# Patient Record
Sex: Female | Born: 1985 | Race: Black or African American | Hispanic: No | Marital: Single | Smoking: Former smoker
Health system: Southern US, Community
[De-identification: ages and names within clinical notes are randomized; demographics above are authoritative.]

## PROBLEM LIST (undated history)

## (undated) ENCOUNTER — Inpatient Hospital Stay (HOSPITAL_COMMUNITY): Payer: Self-pay

## (undated) DIAGNOSIS — F329 Major depressive disorder, single episode, unspecified: Secondary | ICD-10-CM

## (undated) DIAGNOSIS — D259 Leiomyoma of uterus, unspecified: Secondary | ICD-10-CM

## (undated) DIAGNOSIS — I10 Essential (primary) hypertension: Secondary | ICD-10-CM

## (undated) DIAGNOSIS — F4310 Post-traumatic stress disorder, unspecified: Secondary | ICD-10-CM

## (undated) DIAGNOSIS — G43909 Migraine, unspecified, not intractable, without status migrainosus: Secondary | ICD-10-CM

## (undated) DIAGNOSIS — O139 Gestational [pregnancy-induced] hypertension without significant proteinuria, unspecified trimester: Secondary | ICD-10-CM

## (undated) DIAGNOSIS — D219 Benign neoplasm of connective and other soft tissue, unspecified: Secondary | ICD-10-CM

## (undated) DIAGNOSIS — R87619 Unspecified abnormal cytological findings in specimens from cervix uteri: Secondary | ICD-10-CM

## (undated) DIAGNOSIS — B977 Papillomavirus as the cause of diseases classified elsewhere: Secondary | ICD-10-CM

## (undated) HISTORY — DX: Unspecified abnormal cytological findings in specimens from cervix uteri: R87.619

## (undated) HISTORY — PX: WISDOM TOOTH EXTRACTION: SHX21

## (undated) HISTORY — DX: Migraine, unspecified, not intractable, without status migrainosus: G43.909

## (undated) HISTORY — DX: Leiomyoma of uterus, unspecified: D25.9

---
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
## 2007-08-25 ENCOUNTER — Other Ambulatory Visit: Admission: RE | Admit: 2007-08-25 | Discharge: 2007-08-25 | Payer: Self-pay | Admitting: Internal Medicine

## 2010-06-18 ENCOUNTER — Emergency Department (HOSPITAL_COMMUNITY): Payer: Self-pay

## 2010-06-18 ENCOUNTER — Inpatient Hospital Stay (INDEPENDENT_AMBULATORY_CARE_PROVIDER_SITE_OTHER)
Admission: RE | Admit: 2010-06-18 | Discharge: 2010-06-18 | Disposition: A | Discharge status: Home / Self Care | Payer: Self-pay | Source: Ambulatory Visit | Attending: Family Medicine | Admitting: Family Medicine

## 2010-06-18 ENCOUNTER — Emergency Department (HOSPITAL_COMMUNITY)
Admission: EM | Admit: 2010-06-18 | Discharge: 2010-06-18 | Disposition: A | Discharge status: Home / Self Care | Payer: Self-pay | Attending: Emergency Medicine | Admitting: Emergency Medicine

## 2010-06-18 DIAGNOSIS — R319 Hematuria, unspecified: Secondary | ICD-10-CM

## 2010-06-18 DIAGNOSIS — R1031 Right lower quadrant pain: Secondary | ICD-10-CM | POA: Insufficient documentation

## 2010-06-18 DIAGNOSIS — R109 Unspecified abdominal pain: Secondary | ICD-10-CM

## 2010-06-18 DIAGNOSIS — R110 Nausea: Secondary | ICD-10-CM | POA: Insufficient documentation

## 2010-06-18 DIAGNOSIS — R630 Anorexia: Secondary | ICD-10-CM | POA: Insufficient documentation

## 2010-06-18 DIAGNOSIS — N3090 Cystitis, unspecified without hematuria: Secondary | ICD-10-CM | POA: Insufficient documentation

## 2010-06-18 DIAGNOSIS — N898 Other specified noninflammatory disorders of vagina: Secondary | ICD-10-CM | POA: Insufficient documentation

## 2010-06-18 LAB — DIFFERENTIAL
Eosinophils Absolute: 0.1 10*3/uL (ref 0.0–0.7)
Lymphocytes Relative: 9 % — ABNORMAL LOW (ref 12–46)
Lymphs Abs: 1.1 10*3/uL (ref 0.7–4.0)
Monocytes Absolute: 0.5 10*3/uL (ref 0.1–1.0)

## 2010-06-18 LAB — COMPREHENSIVE METABOLIC PANEL
ALT: 25 U/L (ref 0–35)
AST: 32 U/L (ref 0–37)
Alkaline Phosphatase: 74 U/L (ref 39–117)
BUN: 8 mg/dL (ref 6–23)
CO2: 25 mEq/L (ref 19–32)
GFR calc Af Amer: >60 mL/min (ref >60)
GFR calc non Af Amer: >60 mL/min (ref >60)
Glucose, Bld: 113 mg/dL — ABNORMAL HIGH (ref 70–99)
Sodium: 140 mEq/L (ref 135–145)
Total Bilirubin: 0.4 mg/dL (ref 0.3–1.2)
Total Protein: 7.2 g/dL (ref 6.0–8.3)

## 2010-06-18 LAB — URINALYSIS, ROUTINE W REFLEX MICROSCOPIC
Glucose, UA: NEGATIVE mg/dL
Nitrite: POSITIVE — AB
Urobilinogen, UA: 0.2 mg/dL (ref 0.0–1.0)
pH: 6.5 (ref 5.0–8.0)

## 2010-06-18 LAB — CBC
HCT: 32.3 % — ABNORMAL LOW (ref 36.0–46.0)
Hemoglobin: 11.2 g/dL — ABNORMAL LOW (ref 12.0–15.0)
MCH: 27.1 pg (ref 26.0–34.0)
MCV: 78.2 fL (ref 78.0–100.0)
RDW: 12.4 % (ref 11.5–15.5)
WBC: 11.4 10*3/uL — ABNORMAL HIGH (ref 4.0–10.5)

## 2010-06-18 LAB — WET PREP, GENITAL
Trich, Wet Prep: NONE SEEN
Yeast Wet Prep HPF POC: NONE SEEN

## 2010-06-18 LAB — POCT PREGNANCY, URINE: Preg Test, Ur: NEGATIVE

## 2010-06-18 LAB — URINE MICROSCOPIC-ADD ON

## 2010-06-18 MED ORDER — IOHEXOL 300 MG/ML  SOLN
100.0000 mL | Freq: Once | INTRAMUSCULAR | Status: AC | PRN
  Administered 2010-06-18: 100 mL via INTRAVENOUS

## 2010-06-19 LAB — POCT URINALYSIS DIP (DEVICE)
Nitrite: POSITIVE — AB
Protein, ur: NEGATIVE mg/dL
Specific Gravity, Urine: 1.025 (ref 1.005–1.030)
Urobilinogen, UA: 0.2 mg/dL (ref 0.0–1.0)

## 2010-06-19 LAB — GC/CHLAMYDIA PROBE AMP, GENITAL: Chlamydia, DNA Probe: NEGATIVE

## 2010-06-21 LAB — URINE CULTURE
Colony Count: >=100000
Culture  Setup Time: 201206132006

## 2011-07-07 ENCOUNTER — Other Ambulatory Visit: Payer: Self-pay | Admitting: Obstetrics & Gynecology

## 2011-07-07 ENCOUNTER — Other Ambulatory Visit (HOSPITAL_COMMUNITY)
Admission: RE | Admit: 2011-07-07 | Discharge: 2011-07-07 | Disposition: A | Discharge status: Home / Self Care | Payer: BC Managed Care – PPO | Source: Ambulatory Visit | Attending: Obstetrics & Gynecology | Admitting: Obstetrics & Gynecology

## 2011-07-07 DIAGNOSIS — Z124 Encounter for screening for malignant neoplasm of cervix: Secondary | ICD-10-CM | POA: Insufficient documentation

## 2011-07-07 DIAGNOSIS — Z113 Encounter for screening for infections with a predominantly sexual mode of transmission: Secondary | ICD-10-CM | POA: Insufficient documentation

## 2011-09-15 ENCOUNTER — Inpatient Hospital Stay (HOSPITAL_COMMUNITY)
Admission: AD | Admit: 2011-09-15 | Discharge: 2011-09-16 | Disposition: A | Discharge status: Home / Self Care | Payer: BC Managed Care – PPO | Source: Ambulatory Visit | Attending: Obstetrics & Gynecology | Admitting: Obstetrics & Gynecology

## 2011-09-15 ENCOUNTER — Encounter (HOSPITAL_COMMUNITY): Payer: Self-pay | Admitting: *Deleted

## 2011-09-15 DIAGNOSIS — N390 Urinary tract infection, site not specified: Secondary | ICD-10-CM

## 2011-09-15 DIAGNOSIS — N938 Other specified abnormal uterine and vaginal bleeding: Secondary | ICD-10-CM | POA: Insufficient documentation

## 2011-09-15 DIAGNOSIS — N949 Unspecified condition associated with female genital organs and menstrual cycle: Secondary | ICD-10-CM | POA: Insufficient documentation

## 2011-09-15 DIAGNOSIS — M545 Low back pain, unspecified: Secondary | ICD-10-CM | POA: Insufficient documentation

## 2011-09-15 DIAGNOSIS — R109 Unspecified abdominal pain: Secondary | ICD-10-CM | POA: Insufficient documentation

## 2011-09-15 DIAGNOSIS — D259 Leiomyoma of uterus, unspecified: Secondary | ICD-10-CM

## 2011-09-15 DIAGNOSIS — N840 Polyp of corpus uteri: Secondary | ICD-10-CM

## 2011-09-15 DIAGNOSIS — N939 Abnormal uterine and vaginal bleeding, unspecified: Secondary | ICD-10-CM

## 2011-09-15 HISTORY — DX: Benign neoplasm of connective and other soft tissue, unspecified: D21.9

## 2011-09-15 HISTORY — DX: Papillomavirus as the cause of diseases classified elsewhere: B97.7

## 2011-09-15 LAB — URINALYSIS, ROUTINE W REFLEX MICROSCOPIC
Glucose, UA: NEGATIVE mg/dL
Ketones, ur: NEGATIVE mg/dL
Nitrite: POSITIVE — AB
Urobilinogen, UA: 0.2 mg/dL (ref 0.0–1.0)
pH: 6 (ref 5.0–8.0)

## 2011-09-15 LAB — URINE MICROSCOPIC-ADD ON

## 2011-09-15 LAB — CBC
Hemoglobin: 11.8 g/dL — ABNORMAL LOW (ref 12.0–15.0)
MCH: 26.1 pg (ref 26.0–34.0)
WBC: 12.4 10*3/uL — ABNORMAL HIGH (ref 4.0–10.5)

## 2011-09-15 NOTE — MAU Provider Note (Signed)
History     CSN: [id number]  Arrival date and time: 09/15/11 2003   First Provider Initiated Contact with Patient 09/15/11 2245      Chief Complaint  Patient presents with  . Abdominal Cramping   HPI Katrina Garcia is a 26 y.o. female who presents to MAU with abdominal pain. The pain stared 3 days ago but much worse today. Associated symptoms include vaginal bleeding that started over a week ago and low back pain. She describes the pain as sharp and rates it 10/10. She describes the bleeding as less than a period.   OB History    Grav Para Term Preterm Abortions TAB SAB Ect Mult Living   0               Past Medical History  Diagnosis Date  . Fibroid   . HPV (human papilloma virus) infection     Past Surgical History  Procedure Date  . Wisdom tooth extraction     No family history on file.  History  Substance Use Topics  . Smoking status: Former Smoker    Types: Cigarettes    Quit date: 09/06/2011  . Smokeless tobacco: Not on file  . Alcohol Use: No    Allergies: No Known Allergies  Prescriptions prior to admission  Medication Sig Dispense Refill  . acetaminophen (TYLENOL) 500 MG tablet Take 1,000 mg by mouth every 6 (six) hours as needed. For pain or headache      . Levonorgestrel-Ethinyl Estradiol (SEASONIQUE) 0.15-0.03 &0.01 MG tablet Take 1 tablet by mouth daily.        Review of Systems  Constitutional: Negative for fever, chills and weight loss.  HENT: Negative for ear pain, nosebleeds, congestion, sore throat and neck pain.   Eyes: Negative for blurred vision, double vision, photophobia and pain.  Respiratory: Negative for cough, shortness of breath and wheezing.   Cardiovascular: Negative for chest pain, palpitations and leg swelling.  Gastrointestinal: Positive for heartburn, nausea and abdominal pain. Negative for vomiting, diarrhea and constipation.  Genitourinary: Positive for urgency and frequency. Negative for dysuria.  Musculoskeletal: Positive  for back pain. Negative for myalgias.  Skin: Negative for itching and rash.  Neurological: Negative for dizziness, sensory change, speech change, seizures, weakness and headaches.  Endo/Heme/Allergies: Does not bruise/bleed easily.  Psychiatric/Behavioral: Negative for depression. The patient is not nervous/anxious and does not have insomnia.    Physical Exam   Blood pressure 146/84, pulse 76, temperature 97.9 F (36.6 C), temperature source Oral, resp. rate 20, height 5\' 1"  (1.549 m), weight 180 lb (81.647 kg), last menstrual period 09/04/2011, SpO2 100.00%.  Physical Exam  Nursing note and vitals reviewed. Constitutional: She is oriented to person, place, and time. She appears well-developed and well-nourished. No distress.  HENT:  Head: Normocephalic and atraumatic.  Eyes: EOM are normal.  Neck: Neck supple.  Cardiovascular: Normal rate.   Respiratory: Effort normal.  GI: Soft. There is no tenderness.  Musculoskeletal: Normal range of motion.  Neurological: She is alert and oriented to person, place, and time.  Skin: Skin is warm and dry.  Psychiatric: She has a normal mood and affect. Her behavior is normal. Judgment and thought content normal.    MAU Course  Procedures Results for orders placed during the hospital encounter of 09/15/11 (from the past 24 hour(s))  URINALYSIS, ROUTINE W REFLEX MICROSCOPIC     Status: Abnormal   Collection Time   09/15/11  8:40 PM      Component Value Range  Color, Urine YELLOW  YELLOW   APPearance CLEAR  CLEAR   Specific Gravity, Urine >1.030 (*) 1.005 - 1.030   pH 6.0  5.0 - 8.0   Glucose, UA NEGATIVE  NEGATIVE mg/dL   Hgb urine dipstick TRACE (*) NEGATIVE   Bilirubin Urine NEGATIVE  NEGATIVE   Ketones, ur NEGATIVE  NEGATIVE mg/dL   Protein, ur NEGATIVE  NEGATIVE mg/dL   Urobilinogen, UA 0.2  0.0 - 1.0 mg/dL   Nitrite POSITIVE (*) NEGATIVE   Leukocytes, UA TRACE (*) NEGATIVE  URINE MICROSCOPIC-ADD ON     Status: Abnormal    Collection Time   09/15/11  8:40 PM      Component Value Range   Squamous Epithelial / LPF FEW (*) RARE   WBC, UA 3-6  <3 WBC/hpf   RBC / HPF 3-6  <3 RBC/hpf   Bacteria, UA MANY (*) RARE  POCT PREGNANCY, URINE     Status: Normal   Collection Time   09/15/11  8:55 PM      Component Value Range   Preg Test, Ur NEGATIVE  NEGATIVE   US Transvaginal Non-ob  09/16/2011  *RADIOLOGY REPORT*  Clinical Data: AUB x2 weeks.  Pain for 5 days.  TRANSABDOMINAL AND TRANSVAGINAL ULTRASOUND OF PELVIS Technique:  Both transabdominal and transvaginal ultrasound examinations of the pelvis were performed. Transabdominal technique was performed for global imaging of the pelvis including uterus, ovaries, adnexal regions, and pelvic cul-de-sac.  It was necessary to proceed with endovaginal exam following the transabdominal exam to visualize the ovaries and endometrium.  Comparison:  None  Findings:  Uterus: The uterus is anteverted and measures 7.6 x 4.3 x 5.5 cm. There is a myometrial subserosal fibroid in the posterior left lateral fundal region measuring 1.9 cm diameter.  There is a relatively hyperechoic focus in the anterior fundal myometrium which appears to displace the endometrial canal.  This could represent an atypical fibroid.  This area is ovoid in appearance, measuring 1.3 x 3 cm.  Endometrium: Endometrial stripe thickness is normal, measuring 4 mm, however, there is fluid demonstrated in the endometrium and in the lower uterine segment.  Endometrial polyp or physiologic fluid should be considered.  Right ovary:  The right ovary measures 2.9 x 1.3 x 2.2 cm.  Normal follicular changes.  No abnormal adnexal masses.  Left ovary: Left ovary measures 2.6 x 1.1 x 1.9 cm.  Normal follicular changes.  No abnormal adnexal masses.  Other findings: Small amount of free fluid around the right ovary. Color flow Doppler demonstrates flow in both ovaries.  IMPRESSION: Uterine fibroids with heterogeneous nonspecific echogenic focus  in the anterior myometrium.  Fluid in the endometrium.  Consider endometrial polyp.  Ovaries are unremarkable.   Original Report Authenticated By: Marlon Pel, M.D.    US Pelvis Complete  09/16/2011  *RADIOLOGY REPORT*  Clinical Data: AUB x2 weeks.  Pain for 5 days.  TRANSABDOMINAL AND TRANSVAGINAL ULTRASOUND OF PELVIS Technique:  Both transabdominal and transvaginal ultrasound examinations of the pelvis were performed. Transabdominal technique was performed for global imaging of the pelvis including uterus, ovaries, adnexal regions, and pelvic cul-de-sac.  It was necessary to proceed with endovaginal exam following the transabdominal exam to visualize the ovaries and endometrium.  Comparison:  None  Findings:  Uterus: The uterus is anteverted and measures 7.6 x 4.3 x 5.5 cm. There is a myometrial subserosal fibroid in the posterior left lateral fundal region measuring 1.9 cm diameter.  There is a relatively hyperechoic focus in  the anterior fundal myometrium which appears to displace the endometrial canal.  This could represent an atypical fibroid.  This area is ovoid in appearance, measuring 1.3 x 3 cm.  Endometrium: Endometrial stripe thickness is normal, measuring 4 mm, however, there is fluid demonstrated in the endometrium and in the lower uterine segment.  Endometrial polyp or physiologic fluid should be considered.  Right ovary:  The right ovary measures 2.9 x 1.3 x 2.2 cm.  Normal follicular changes.  No abnormal adnexal masses.  Left ovary: Left ovary measures 2.6 x 1.1 x 1.9 cm.  Normal follicular changes.  No abnormal adnexal masses.  Other findings: Small amount of free fluid around the right ovary. Color flow Doppler demonstrates flow in both ovaries.  IMPRESSION: Uterine fibroids with heterogeneous nonspecific echogenic focus in the anterior myometrium.  Fluid in the endometrium.  Consider endometrial polyp.  Ovaries are unremarkable.   Original Report Authenticated By: Marlon Pel,  M.D.    Assessment: 26 y.o. female with abnormal vaginal bleeding   Pelvic pain   Uterine fibroid   Endometrial polyp   UTI  Plan:  Pain management   Tx UTI   Follow up in the office, return here as needed.  Discussed with Dr. Christell Constant and he agrees with plan of care.   Medication List     As of 09/16/2011  2:58 AM    START taking these medications         oxyCODONE-acetaminophen 5-325 MG per tablet   Commonly known as: PERCOCET/ROXICET   Take 1 tablet by mouth every 4 (four) hours as needed for pain.      sulfamethoxazole-trimethoprim 800-160 MG per tablet   Commonly known as: BACTRIM DS,SEPTRA DS   Take 1 tablet by mouth 2 (two) times daily.      CONTINUE taking these medications         acetaminophen 500 MG tablet   Commonly known as: TYLENOL      SEASONIQUE 0.15-0.03 &0.01 MG tablet   Generic drug: Levonorgestrel-Ethinyl Estradiol          Where to get your medications    These are the prescriptions that you need to pick up.   You may get these medications from any pharmacy.         oxyCODONE-acetaminophen 5-325 MG per tablet   sulfamethoxazole-trimethoprim 800-160 MG per tablet          I have reviewed the nurses noted, vital signs, ultrasound and discussed the results in detail with the patient. Patient voices understanding.   Walker Sitar, RN, FNP, [facility] 09/15/2011, 10:45 PM

## 2011-09-15 NOTE — MAU Note (Signed)
Pt reports brownish discharge started 1.5 weeks ago, then over last few days bright red bleeding "not a lot", and abd cramping. Started BCP's in July. LMP in july

## 2011-09-16 ENCOUNTER — Inpatient Hospital Stay (HOSPITAL_COMMUNITY): Payer: BC Managed Care – PPO

## 2011-09-16 LAB — WET PREP, GENITAL: Trich, Wet Prep: NONE SEEN

## 2011-09-16 LAB — GC/CHLAMYDIA PROBE AMP, GENITAL
Chlamydia, DNA Probe: NEGATIVE
GC Probe Amp, Genital: NEGATIVE

## 2011-09-16 LAB — HCG, QUANTITATIVE, PREGNANCY: hCG, Beta Chain, Quant, S: <1 m[IU]/mL (ref <5)

## 2011-09-16 MED ORDER — OXYCODONE-ACETAMINOPHEN 5-325 MG PO TABS
1.0000 | ORAL_TABLET | Freq: Once | ORAL | Status: AC
  Administered 2011-09-16: 1 via ORAL
  Filled 2011-09-16: qty 1

## 2011-09-16 MED ORDER — OXYCODONE-ACETAMINOPHEN 5-325 MG PO TABS: 1.0000 | ORAL_TABLET | ORAL | Status: AC | PRN

## 2011-09-16 MED ORDER — SULFAMETHOXAZOLE-TRIMETHOPRIM 800-160 MG PO TABS: 1.0000 | ORAL_TABLET | Freq: Two times a day (BID) | ORAL | Status: AC

## 2011-09-16 NOTE — Progress Notes (Signed)
Small amount of uterine tissue noted in cervical os. Removed easily by H.NeeseNP. Will send to pathology for eval.

## 2011-10-09 NOTE — MAU Provider Note (Signed)
As I was informed and agreed with the plan of care

## 2012-07-15 ENCOUNTER — Other Ambulatory Visit: Payer: Self-pay | Admitting: Obstetrics and Gynecology

## 2012-07-15 DIAGNOSIS — N644 Mastodynia: Secondary | ICD-10-CM

## 2012-07-26 ENCOUNTER — Other Ambulatory Visit: Payer: BC Managed Care – PPO

## 2012-08-08 ENCOUNTER — Ambulatory Visit
Admission: RE | Admit: 2012-08-08 | Discharge: 2012-08-08 | Disposition: A | Discharge status: Home / Self Care | Payer: BC Managed Care – PPO | Source: Ambulatory Visit | Attending: Obstetrics and Gynecology | Admitting: Obstetrics and Gynecology

## 2012-08-08 ENCOUNTER — Other Ambulatory Visit: Payer: Self-pay | Admitting: Obstetrics and Gynecology

## 2012-08-08 DIAGNOSIS — N644 Mastodynia: Secondary | ICD-10-CM

## 2012-08-09 ENCOUNTER — Emergency Department (HOSPITAL_COMMUNITY)
Admission: EM | Admit: 2012-08-09 | Discharge: 2012-08-10 | Disposition: A | Discharge status: Home / Self Care | Payer: BC Managed Care – PPO | Attending: Emergency Medicine | Admitting: Emergency Medicine

## 2012-08-09 ENCOUNTER — Encounter (HOSPITAL_COMMUNITY): Payer: Self-pay | Admitting: *Deleted

## 2012-08-09 DIAGNOSIS — Z3202 Encounter for pregnancy test, result negative: Secondary | ICD-10-CM | POA: Insufficient documentation

## 2012-08-09 DIAGNOSIS — F329 Major depressive disorder, single episode, unspecified: Secondary | ICD-10-CM | POA: Insufficient documentation

## 2012-08-09 DIAGNOSIS — R45851 Suicidal ideations: Secondary | ICD-10-CM | POA: Insufficient documentation

## 2012-08-09 DIAGNOSIS — T394X2A Poisoning by antirheumatics, not elsewhere classified, intentional self-harm, initial encounter: Secondary | ICD-10-CM | POA: Insufficient documentation

## 2012-08-09 DIAGNOSIS — Z008 Encounter for other general examination: Secondary | ICD-10-CM | POA: Insufficient documentation

## 2012-08-09 DIAGNOSIS — F4310 Post-traumatic stress disorder, unspecified: Secondary | ICD-10-CM | POA: Insufficient documentation

## 2012-08-09 DIAGNOSIS — T1491XA Suicide attempt, initial encounter: Secondary | ICD-10-CM

## 2012-08-09 DIAGNOSIS — Z8742 Personal history of other diseases of the female genital tract: Secondary | ICD-10-CM | POA: Insufficient documentation

## 2012-08-09 DIAGNOSIS — Z87891 Personal history of nicotine dependence: Secondary | ICD-10-CM | POA: Insufficient documentation

## 2012-08-09 DIAGNOSIS — Z8619 Personal history of other infectious and parasitic diseases: Secondary | ICD-10-CM | POA: Insufficient documentation

## 2012-08-09 DIAGNOSIS — T391X1A Poisoning by 4-Aminophenol derivatives, accidental (unintentional), initial encounter: Secondary | ICD-10-CM | POA: Insufficient documentation

## 2012-08-09 LAB — POCT PREGNANCY, URINE: Preg Test, Ur: NEGATIVE

## 2012-08-09 LAB — CBC
MCH: 26.5 pg (ref 26.0–34.0)
MCHC: 34.1 g/dL (ref 30.0–36.0)
MCV: 77.7 fL — ABNORMAL LOW (ref 78.0–100.0)
Platelets: 328 10*3/uL (ref 150–400)

## 2012-08-09 LAB — COMPREHENSIVE METABOLIC PANEL
ALT: 10 U/L (ref 0–35)
AST: 18 U/L (ref 0–37)
CO2: 23 mEq/L (ref 19–32)
Calcium: 8.9 mg/dL (ref 8.4–10.5)
Creatinine, Ser: 0.7 mg/dL (ref 0.50–1.10)
GFR calc Af Amer: >90 mL/min (ref >90)
GFR calc non Af Amer: >90 mL/min (ref >90)
Glucose, Bld: 97 mg/dL (ref 70–99)
Sodium: 136 mEq/L (ref 135–145)
Total Protein: 7.5 g/dL (ref 6.0–8.3)

## 2012-08-09 LAB — GLUCOSE, CAPILLARY: Glucose-Capillary: 84 mg/dL (ref 70–99)

## 2012-08-09 LAB — RAPID URINE DRUG SCREEN, HOSP PERFORMED
Amphetamines: NOT DETECTED
Opiates: NOT DETECTED

## 2012-08-09 LAB — SALICYLATE LEVEL: Salicylate Lvl: <2 mg/dL — ABNORMAL LOW (ref 2.8–20.0)

## 2012-08-09 LAB — ACETAMINOPHEN LEVEL: Acetaminophen (Tylenol), Serum: 39.7 ug/mL — ABNORMAL HIGH (ref 10–30)

## 2012-08-09 MED ORDER — POTASSIUM CHLORIDE CRYS ER 20 MEQ PO TBCR
40.0000 meq | EXTENDED_RELEASE_TABLET | Freq: Once | ORAL | Status: AC
  Administered 2012-08-09: 40 meq via ORAL
  Filled 2012-08-09: qty 2

## 2012-08-09 NOTE — ED Notes (Signed)
Pt in stating she took approx 7, 500 mg tylenol tablets around 1730-1800 today, pt states she was not trying to hurt herself and she isn't sure why she took so many, pt with flat affect noted, pt admits to some SI today but denies at this time, denies HI, denies ETOH or drug use.

## 2012-08-10 ENCOUNTER — Encounter (HOSPITAL_COMMUNITY): Payer: Self-pay | Admitting: *Deleted

## 2012-08-10 ENCOUNTER — Inpatient Hospital Stay (HOSPITAL_COMMUNITY)
Admission: EM | Admit: 2012-08-10 | Discharge: 2012-08-13 | DRG: 430 | Disposition: A | Discharge status: Home / Self Care | Payer: BC Managed Care – PPO | Source: Intra-hospital | Attending: Psychiatry | Admitting: Psychiatry

## 2012-08-10 DIAGNOSIS — F329 Major depressive disorder, single episode, unspecified: Secondary | ICD-10-CM | POA: Diagnosis present

## 2012-08-10 DIAGNOSIS — Z79899 Other long term (current) drug therapy: Secondary | ICD-10-CM

## 2012-08-10 DIAGNOSIS — F4310 Post-traumatic stress disorder, unspecified: Secondary | ICD-10-CM | POA: Diagnosis present

## 2012-08-10 HISTORY — DX: Post-traumatic stress disorder, unspecified: F43.10

## 2012-08-10 HISTORY — DX: Major depressive disorder, single episode, unspecified: F32.9

## 2012-08-10 MED ORDER — LEVONORGEST-ETH ESTRAD 91-DAY 0.15-0.03 &0.01 MG PO TABS
1.0000 | ORAL_TABLET | Freq: Every day | ORAL | Status: DC
  Administered 2012-08-10 – 2012-08-12 (×3): 1 via ORAL

## 2012-08-10 MED ORDER — TRAZODONE HCL 50 MG PO TABS
50.0000 mg | ORAL_TABLET | Freq: Every evening | ORAL | Status: DC | PRN
  Administered 2012-08-10: 50 mg via ORAL
  Filled 2012-08-10: qty 1

## 2012-08-10 MED ORDER — MAGNESIUM HYDROXIDE 400 MG/5ML PO SUSP: 30.0000 mL | Freq: Every day | ORAL | Status: DC | PRN

## 2012-08-10 MED ORDER — ALUM & MAG HYDROXIDE-SIMETH 200-200-20 MG/5ML PO SUSP: 30.0000 mL | ORAL | Status: DC | PRN

## 2012-08-10 MED ORDER — ACETAMINOPHEN 325 MG PO TABS: 650.0000 mg | ORAL_TABLET | Freq: Four times a day (QID) | ORAL | Status: DC | PRN

## 2012-08-10 NOTE — ED Notes (Signed)
Telepsych being completed at this time. 

## 2012-08-10 NOTE — ED Notes (Signed)
Pt upset about becoming IVC.  Pt reports "I am not going there. Fuck that."  Security noted of situation and informed to be on standby.

## 2012-08-10 NOTE — Progress Notes (Signed)
Adult Psychoeducational Group Note  Date:  08/10/2012 Time:  9:38 PM  Group Topic/Focus:  Wrap-Up Group:   The focus of this group is to help patients review their daily goal of treatment and discuss progress on daily workbooks.  Participation Level:  Active  Participation Quality:  Appropriate  Affect:  Appropriate  Cognitive:  Alert  Insight: Appropriate  Engagement in Group:  Engaged  Modes of Intervention:  Discussion  Additional Comments:  Pt stated that this is her first full day on the unit and it has been very stressful. Pt also stated that she is trying to be positive and look on the brighter side of things. Pt wants to learn how to cope with her stress and insecurities.  Flonnie Hailstone 08/10/2012, 9:38 PM

## 2012-08-10 NOTE — Tx Team (Signed)
Initial Interdisciplinary Treatment Plan  PATIENT STRENGTHS: (choose at least two) Average or above average intelligence Capable of independent living General fund of knowledge Supportive family/friends  PATIENT STRESSORS: Unknown   PROBLEM LIST: Problem List/Patient Goals Date to be addressed Date deferred Reason deferred Estimated date of resolution                                                         DISCHARGE CRITERIA:  Improved stabilization in mood, thinking, and/or behavior Need for constant or close observation no longer present Reduction of life-threatening or endangering symptoms to within safe limits  PRELIMINARY DISCHARGE PLAN: Outpatient therapy Return to previous living arrangement  PATIENT/FAMIILY INVOLVEMENT: This treatment plan has been presented to and reviewed with the patient, Katrina Garcia.  The patient and family have been given the opportunity to ask questions and make suggestions.  [name] M 08/10/2012, 6:10 PM

## 2012-08-10 NOTE — Progress Notes (Signed)
Patient ID: Katrina Garcia, female   DOB: Nov 10, 1985, 27 y.o.   MRN: [id number] Patient overdosed on 7500 mg of Tylenol and was sending out texts stating " I dont want to be here anymore; patient reported that she was having some si thoughts in the past and the day; patient has no other psychiatric history; patient reports no use of etoh or alcohol; patient named no stressors at this time

## 2012-08-10 NOTE — ED Notes (Signed)
[facility] police department at bedside serving patient involuntary commitment papers.  Family remains present at patients bedside.

## 2012-08-10 NOTE — ED Notes (Signed)
Pt and family came to nursing station and reported "I will go voluntarily now."  Social worker consulted regarding situation and informed that the papers have been completed and faxed to magistrate for involuntary commitment.  Pt made aware of the situation.  Pt back in room crying and upset about having to go to behavioral health hospital for treatment.

## 2012-08-10 NOTE — ED Notes (Signed)
Pt and family standing outside bathroom door.  Pt is becoming agitated.  Pt and family informed they needed to remain inside patients room.  Pt provided with a meal tray and refusing to eat.

## 2012-08-10 NOTE — Progress Notes (Signed)
Previous write recommended inpt. Pt declined to voluntarily enter [facility]. Writer received a call from Lamar, RN wanting to know what the status of the pt's admission was. After review of the assessors note and conversation with Dr. Lucianne Muss, the pt is to be IVC'd and transferred to [facility] 500 hall. Currently there are no beds on that hall and writer was advised by Lupita Leash, Geophysicist/field seismologist Nurse that if there are no beds after the IVC has been served the pt will be sent to [facility] ED at [place]. 12:15P-writer received a call from "PJ", nurse case manager and I instructed her how to complete the IVC docs. Denice Bors, AADC 08/10/2012 12:24 PM

## 2012-08-10 NOTE — ED Provider Notes (Addendum)
CSN: [id number]     Arrival date & time 08/09/12  1951 History     First MD Initiated Contact with Patient 08/09/12 2122     Chief Complaint  Patient presents with  . Medical Clearance  . Drug Overdose   (Consider location/radiation/quality/duration/timing/severity/associated sxs/prior Treatment) The history is provided by the patient.   27 year old female brought in by family members. Patient overdosed on Tylenol between 5:30 and 6 PM took 7 tablets of 500 mg Tylenol. Denies taking anything else. Patient states she was not trying to hurt her self and she isn't sure why she took so many however she was upset over something and doesn't note that she was feeling suicidal at the time. Denies any homicidal ideation denies any alcohol or drug use. Denies any history of previous suicide attempt. Patient denies being suicidal now but doesn't it but that was her intent. Patient denies any nausea or vomiting. Patient denies any abdominal pain.  Past Medical History  Diagnosis Date  . Fibroid   . HPV (human papilloma virus) infection    Past Surgical History  Procedure Laterality Date  . Wisdom tooth extraction     History reviewed. No pertinent family history. History  Substance Use Topics  . Smoking status: Former Smoker    Types: Cigarettes    Quit date: 09/06/2011  . Smokeless tobacco: Not on file  . Alcohol Use: No   OB History   Grav Para Term Preterm Abortions TAB SAB Ect Mult Living   0              Review of Systems  Constitutional: Negative for fever.  HENT: Negative for congestion.   Eyes: Negative for redness.  Respiratory: Negative for shortness of breath.   Cardiovascular: Negative for chest pain.  Gastrointestinal: Negative for nausea, vomiting, abdominal pain and diarrhea.  Genitourinary: Negative for dysuria.  Musculoskeletal: Negative for back pain.  Skin: Negative for rash.  Neurological: Negative for weakness and headaches.  Hematological: Does not  bruise/bleed easily.  Psychiatric/Behavioral: Positive for suicidal ideas. Negative for confusion.    Allergies  Review of patient's allergies indicates no known allergies.  Home Medications   Current Outpatient Rx  Name  Route  Sig  Dispense  Refill  . acetaminophen (TYLENOL) 500 MG tablet   Oral   Take 3,500 mg by mouth once. For pain or headache         . Levonorgestrel-Ethinyl Estradiol (SEASONIQUE) 0.15-0.03 &0.01 MG tablet   Oral   Take 1 tablet by mouth every evening. At 5pm         . naproxen sodium (ANAPROX) 220 MG tablet   Oral   Take 220 mg by mouth daily as needed (pain).          BP 122/86  Pulse 71  Temp(Src) 98.5 F (36.9 C) (Oral)  Resp 15  Ht 5\' 3"  (1.6 m)  Wt 176 lb (79.833 kg)  BMI 31.18 kg/m2  SpO2 100% Physical Exam  Nursing note and vitals reviewed. Constitutional: She is oriented to person, place, and time. She appears well-developed and well-nourished. No distress.  HENT:  Head: Normocephalic and atraumatic.  Mouth/Throat: Oropharynx is clear and moist.  Eyes: Conjunctivae and EOM are normal. Pupils are equal, round, and reactive to light.  Neck: Normal range of motion.  Cardiovascular: Normal rate, regular rhythm and normal heart sounds.   No murmur heard. Pulmonary/Chest: Effort normal and breath sounds normal. No respiratory distress.  Abdominal: Soft. Bowel sounds are normal.  There is no tenderness.  Musculoskeletal: Normal range of motion. She exhibits no edema.  Neurological: She is alert and oriented to person, place, and time. No cranial nerve deficit. She exhibits normal muscle tone. Coordination normal.  Skin: Skin is warm. No rash noted.    ED Course   Procedures (including critical care time)  Labs Reviewed  CBC - Abnormal; Notable for the following:    Hemoglobin 11.9 (*)    HCT 34.9 (*)    MCV 77.7 (*)    All other components within normal limits  COMPREHENSIVE METABOLIC PANEL - Abnormal; Notable for the  following:    Potassium 3.3 (*)    Total Bilirubin 0.2 (*)    All other components within normal limits  ACETAMINOPHEN LEVEL - Abnormal; Notable for the following:    Acetaminophen (Tylenol), Serum 39.7 (*)    All other components within normal limits  SALICYLATE LEVEL - Abnormal; Notable for the following:    Salicylate Lvl <2.0 (*)    All other components within normal limits  ETHANOL  URINE RAPID DRUG SCREEN (HOSP PERFORMED)  GLUCOSE, CAPILLARY  ACETAMINOPHEN LEVEL  POCT PREGNANCY, URINE   US Breast Bilateral  08/08/2012   *RADIOLOGY REPORT*  Clinical Data:  The patient presents with bilateral palpable abnormalities.  BILATERAL BREAST ULTRASOUND  Comparison:  None  On physical exam, I palpate no discrete mass within the upper-outer quadrant of the right breast.  Prominent tissue is palpated within the lateral aspect of the left breast.  Findings: Ultrasound is performed, showing a 0.7 x 0.3 x 0.6 cm oval circumscribed hypoechoic mass with central increased echogenicity likely representing a lymph node with fatty hilum in the right breast 10 o'clock position 10 cm from the nipple.  No concerning mass or abnormality was identified within the lateral left breast at the site of palpable abnormality.  IMPRESSION: Probably benign lymph node within the right breast 10 o'clock position corresponds with the patient's palpable abnormality.  RECOMMENDATION: Right breast ultrasound in 6 months is recommended to ensure stability of probably benign finding within the right breast.  I have discussed the findings and recommendations with the patient. Results were also provided in writing at the conclusion of the visit.  If applicable, a reminder letter will be sent to the patient regarding the next appointment.  BI-RADS CATEGORY 3:  Probably benign finding(s) - short interval follow-up suggested.   Original Report Authenticated By: Annia Belt, M.D   Results for orders placed during the hospital encounter of  08/09/12  CBC      Result Value Range   WBC 8.3  4.0 - 10.5 K/uL   RBC 4.49  3.87 - 5.11 MIL/uL   Hemoglobin 11.9 (*) 12.0 - 15.0 g/dL   HCT 04.5 (*) 40.9 - 81.1 %   MCV 77.7 (*) 78.0 - 100.0 fL   MCH 26.5  26.0 - 34.0 pg   MCHC 34.1  30.0 - 36.0 g/dL   RDW 91.4  78.2 - 95.6 %   Platelets 328  150 - 400 K/uL  COMPREHENSIVE METABOLIC PANEL      Result Value Range   Sodium 136  135 - 145 mEq/L   Potassium 3.3 (*) 3.5 - 5.1 mEq/L   Chloride 103  96 - 112 mEq/L   CO2 23  19 - 32 mEq/L   Glucose, Bld 97  70 - 99 mg/dL   BUN 6  6 - 23 mg/dL   Creatinine, Ser 2.13  0.50 - 1.10 mg/dL  Calcium 8.9  8.4 - 10.5 mg/dL   Total Protein 7.5  6.0 - 8.3 g/dL   Albumin 4.0  3.5 - 5.2 g/dL   AST 18  0 - 37 U/L   ALT 10  0 - 35 U/L   Alkaline Phosphatase 55  39 - 117 U/L   Total Bilirubin 0.2 (*) 0.3 - 1.2 mg/dL   GFR calc non Af Amer >90  >90 mL/min   GFR calc Af Amer >90  >90 mL/min  ETHANOL      Result Value Range   Alcohol, Ethyl (B) <11  0 - 11 mg/dL  ACETAMINOPHEN LEVEL      Result Value Range   Acetaminophen (Tylenol), Serum 39.7 (*) 10 - 30 ug/mL  SALICYLATE LEVEL      Result Value Range   Salicylate Lvl <2.0 (*) 2.8 - 20.0 mg/dL  URINE RAPID DRUG SCREEN (HOSP PERFORMED)      Result Value Range   Opiates NONE DETECTED  NONE DETECTED   Cocaine NONE DETECTED  NONE DETECTED   Benzodiazepines NONE DETECTED  NONE DETECTED   Amphetamines NONE DETECTED  NONE DETECTED   Tetrahydrocannabinol NONE DETECTED  NONE DETECTED   Barbiturates NONE DETECTED  NONE DETECTED  GLUCOSE, CAPILLARY      Result Value Range   Glucose-Capillary 84  70 - 99 mg/dL   Comment 1 Documented in Chart     Comment 2 Notify RN    POCT PREGNANCY, URINE      Result Value Range   Preg Test, Ur NEGATIVE  NEGATIVE    Date: 08/10/2012  Rate: 80  Rhythm: normal sinus rhythm  QRS Axis: normal  Intervals: normal  ST/T Wave abnormalities: nonspecific ST/T changes  Conduction Disutrbances:none  Narrative  Interpretation:   Old EKG Reviewed: none available     1. Suicide attempt   2. Overdose by acetaminophen, initial encounter     MDM  The patient's initial Tylenol level was less than 4 hours. Patient has a repeat Tylenol level if the Tylenol is increasing or is toxic will require further evaluation. If it's increasing and not toxic will need another level checked. If it's toxic and will need to start Mucomyst. If medically cleared patient still needs evaluation by behavioral health because this was a suicide attempt. Currently denies any further suicide intent. Liver function tests are normal urine drug screen is negative pregnancy test is negative no alcohol on board aspirin level is negative no evidence of toxicity currently from the Tylenol overdose.   Shelda Jakes, MD 08/10/12 0100  Patient medically cleared repeat Tylenol level was in the 20s less than the original this is not a toxic level and it is going down. Patient we moved to see [facility] for ACT evaluation.  Shelda Jakes, MD 08/10/12 [phone]
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
## 2012-08-10 NOTE — Progress Notes (Signed)
Pt has been accepted by Dr. Lucianne Muss to Dr. Elsie Saas, bed 501-2. Report can be called to ext. [phone] and call [facility] to transport. Denice Bors, AADC 08/10/2012 2:48 PM

## 2012-08-10 NOTE — ED Notes (Signed)
ACT team called by RN regarding patients plan of care.  Waiting for ACT team to call back with information.

## 2012-08-10 NOTE — Progress Notes (Signed)
Pt is new to the unit this evening.  Pt says she is nervous about being here.  Assured pt that she was safe and encouraged pt to make her needs known to staff.  Explained procedures on the hall for the evening.  Pt voiced understanding.  Pt denies SI/HI/AV at this time.  Obtained order for pt's BCP.  Support and encouragement offered.  Pt voices no needs or concerns at this time.  Safety maintained with q15 minute checks.

## 2012-08-10 NOTE — ED Notes (Signed)
RN spoke with ACT team and informed that pt was placed as an IVC patient.  MD Radford Pax made aware and informed RN to get papers filled out by someone else.  Social Worker at nursing station filling out paper work.  Mom informed pt was changed to involuntary commitment and reported that she wanted to take the patient home and care for her there.  Will inform MD of the situation.

## 2012-08-11 MED ORDER — ESCITALOPRAM OXALATE 10 MG PO TABS
10.0000 mg | ORAL_TABLET | Freq: Every day | ORAL | Status: DC
  Administered 2012-08-11 – 2012-08-13 (×3): 10 mg via ORAL
  Filled 2012-08-11 (×2): qty 1
  Filled 2012-08-11 (×2): qty 3
  Filled 2012-08-11 (×3): qty 1

## 2012-08-11 NOTE — [facility] Group Notes (Signed)
[facility] LCSW Group Therapy  [facility] 1:15 - 2:30 PM  08/11/2012   Type of Therapy:  Group Therapy  Participation Level:  Active  Participation Quality:  Attentive  Affect:  Appropriate  Cognitive:  Appropriate  Insight:  Developing/Improving and Engaged  Engagement in Therapy:  Developing/Improving Engaged  Modes of Intervention:  Discussion, Education, Exploration, Problem-Solving, Rapport Building, Support   Summary of Progress/Problems:  Patient listened attentively to speaker from [facility]. She advised of having a similar experience and thanked speaker for sharing her story.  Wynn Banker 08/11/2012
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
## 2012-08-11 NOTE — Progress Notes (Signed)
D:  Patient's self inventory sheet, patient sleeps fair, good appetite, high energy level, good attention span.  Rated depression #3, hopelessness #1.  Denied withdrawals.  Denied SI.   Pain goal #1, worst pain #3.  After discharge, plans to talk about her issues, write about them, find a productive activity such as working out.  No questions for staff.  No discharge plans.  No problems taking meds after discharge. A:  Medications administered per MD orders.  Emotional support and encouragement given patient. R:  Denied SI and HI.  Denied A/V hallucinations.  Denied pain.  Will continue to monitor patient for safety with 15 minute checks.  Safety maintained.

## 2012-08-11 NOTE — Progress Notes (Signed)
Adult Psychoeducational Group Note  Date:  08/11/2012 Time:  11:00AM Group Topic/Focus:  Self Esteem Action Plan:   The focus of this group is to help patients create a plan to continue to build self-esteem after discharge.  Participation Level:  Active  Participation Quality:  Appropriate and Attentive  Affect:  Appropriate  Cognitive:  Alert and Appropriate  Insight: Appropriate  Engagement in Group:  Engaged  Modes of Intervention:  Discussion  Additional Comments:  Pt. Was attentive and appropriate during today's group discussion. Pt. Was able to complete Self Esteem worksheet in Handbook. Pt. Came up with the following words for Positive things about herself Friendly, Alvino Blood, and [place]. Pt. Stated that she need to improve on her negative thoughts and learn how to trust people.   Bing Plume D 08/11/2012, 11:36 AM

## 2012-08-11 NOTE — Progress Notes (Signed)
D   Pt is pleasant and cooperative  She attended karoke group and participated   She did not request any medications prior to bedtime   She denies suicidal and homicidal ideation and she also contracts for safety    A   Verbal support given  Medications administered and effectiveness monitored   Q 15 min checks R   Pt safe at present
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
## 2012-08-11 NOTE — Progress Notes (Signed)
Patient did not attend 0900 orientation nurse group this morning.  Patient was in bed.

## 2012-08-11 NOTE — [facility] Suicide Risk Assessment (Signed)
[facility] INPATIENT:  Family/Significant Other Suicide Prevention Education  Suicide Prevention Education:  Education Completed; Rosalva Ferron, Mother, [phone]; has been identified by the patient as the family member/significant other with whom the patient will be residing, and identified as the person(s) who will aid the patient in the event of a mental health crisis (suicidal ideations/suicide attempt).  With written consent from the patient, the family member/significant other has been provided the following suicide prevention education, prior to the and/or following the discharge of the patient.  The suicide prevention education provided includes the following:  Suicide risk factors  Suicide prevention and interventions  National Suicide Hotline telephone number  [facility] assessment telephone number  [facility] Emergency Assistance 911  [facility] and/or Residential Mobile Crisis Unit telephone number  Request made of family/significant other to:  Remove weapons (e.g., guns, rifles, knives), all items previously/currently identified as safety concern.  Mother advised patient does not have access to weapons.   Remove drugs/medications (over-the-counter, prescriptions, illicit drugs), all items previously/currently identified as a safety concern.  The family member/significant other verbalizes understanding of the suicide prevention education information provided.  The family member/significant other agrees to remove the items of safety concern listed above.  Wynn Banker 08/11/2012, 1:05 PM

## 2012-08-12 ENCOUNTER — Encounter (HOSPITAL_COMMUNITY): Payer: Self-pay | Admitting: Physician Assistant

## 2012-08-12 DIAGNOSIS — F4310 Post-traumatic stress disorder, unspecified: Secondary | ICD-10-CM | POA: Diagnosis present

## 2012-08-12 DIAGNOSIS — F329 Major depressive disorder, single episode, unspecified: Principal | ICD-10-CM | POA: Diagnosis present

## 2012-08-12 MED ORDER — ESCITALOPRAM OXALATE 10 MG PO TABS: 10.0000 mg | ORAL_TABLET | Freq: Every day | ORAL | Status: DC

## 2012-08-12 NOTE — Discharge Summary (Signed)
Physician Discharge Summary Note  Patient:  Katrina Garcia is an 27 y.o., female MRN:  [id number] DOB:  1985/05/28 Patient phone:  [phone] (home)  Patient address:   [address]  [place] [address],   Date of Admission:  08/10/2012 Date of Discharge: 08/13/2012  Reason for Admission:  Suicide attempt  Discharge Diagnoses: Principal Problem:   MDD (major depressive disorder) Active Problems:   PTSD (post-traumatic stress disorder)  Review of Systems  Constitutional: Negative.  Negative for fever, chills, weight loss, malaise/fatigue and diaphoresis.  HENT: Negative for congestion and sore throat.   Eyes: Negative for blurred vision, double vision and photophobia.  Respiratory: Negative for cough, shortness of breath and wheezing.   Cardiovascular: Negative for chest pain, palpitations and PND.  Gastrointestinal: Negative for heartburn, nausea, vomiting, abdominal pain, diarrhea and constipation.  Musculoskeletal: Negative for myalgias, joint pain and falls.  Neurological: Negative for dizziness, tingling, tremors, sensory change, speech change, focal weakness, seizures, loss of consciousness, weakness and headaches.  Endo/Heme/Allergies: Negative for polydipsia. Does not bruise/bleed easily.  Psychiatric/Behavioral: Negative for depression, suicidal ideas, hallucinations, memory loss and substance abuse. The patient is not nervous/anxious and does not have insomnia.   Discharge Diagnoses:  AXIS I: Major Depressive Disorder, PTSD  AXIS II: Deferred  AXIS III:  Past Medical History   Diagnosis  Date   .  Fibroid    .  HPV (human papilloma virus) infection    .  MDD (major depressive disorder)    .  PTSD (post-traumatic stress disorder)     AXIS IV: other psychosocial or environmental problems  AXIS V: 61-70 mild symptoms   Level of Care:  OP  Hospital Course:  Beatris was admitted from the [facility] where she presented after overdosing on Tylenol due to her recent  arguement with her boyfriend, her stressful job, and recent finding of a breast lump.  She was evaluated in the ED and felt to be in need of Acute psychiatric hospitalization for crisis management and stabilization.  Labrittany was admitted to the unit and evaluated. The symptoms were identified as increasing depression, anxiety, suicide attempt, crying, poor judgement, no insight, poor sleep, and a decrease in ADLs.      The patient was oriented to the unit and encouraged to participate in unit programming. Medical problems were identified and treated appropriately. Home medication was restarted as needed. Psychiatric medication management was initiated.        Ms. Nanez the patient was evaluated each day by a clinical provider to ascertain the patient's response to treatment.  Improvement was noted by the patient's report of decreasing symptoms, improved sleep and appetite, affect, medication tolerance, behavior, and participation in unit programming.  The patient was asked each day to complete a self inventory noting mood, mental status, pain, new symptoms, anxiety and concerns.        The patient responded well to medication and being in a therapeutic and supportive environment. Positive and appropriate behavior was noted and the patient was motivated for recovery. She worked closely with the treatment team and case manager to develop a discharge plan with appropriate goals. Coping skills, problem solving as well as relaxation therapies were also part of the unit programming.         By the day of discharge the patient was in much improved condition than upon admission.  Symptoms were reported as significantly decreased or resolved completely.  The patient denied SI/HI and voiced no AVH. She was  motivated to continue taking medication with a goal of continued improvement in mental health.  She was discharged home with a plan to follow up as noted below.  Consults:  None  Significant Diagnostic Studies:   None  Discharge Vitals:   Blood pressure 139/82, pulse 112, temperature 98.1 F (36.7 C), temperature source Oral, resp. rate 18, height 5' 1.5" (1.562 m), weight 76.658 kg (169 lb), SpO2 98.00%. Body mass index is 31.42 kg/(m^2). Lab Results:   Results for orders placed during the hospital encounter of 08/09/12 (from the past 72 hour(s))  URINE RAPID DRUG SCREEN (HOSP PERFORMED)     Status: None   Collection Time    08/09/12  9:00 PM      Result Value Range   Opiates NONE DETECTED  NONE DETECTED   Cocaine NONE DETECTED  NONE DETECTED   Benzodiazepines NONE DETECTED  NONE DETECTED   Amphetamines NONE DETECTED  NONE DETECTED   Tetrahydrocannabinol NONE DETECTED  NONE DETECTED   Barbiturates NONE DETECTED  NONE DETECTED   Comment:            DRUG SCREEN FOR MEDICAL PURPOSES     ONLY.  IF CONFIRMATION IS NEEDED     FOR ANY PURPOSE, NOTIFY LAB     WITHIN 5 DAYS.                LOWEST DETECTABLE LIMITS     FOR URINE DRUG SCREEN     Drug Class       Cutoff (ng/mL)     Amphetamine      1000     Barbiturate      200     Benzodiazepine   200     Tricyclics       300     Opiates          300     Cocaine          300     THC              50  POCT PREGNANCY, URINE     Status: None   Collection Time    08/09/12  9:08 PM      Result Value Range   Preg Test, Ur NEGATIVE  NEGATIVE   Comment:            THE SENSITIVITY OF THIS     METHODOLOGY IS >24 mIU/mL  CBC     Status: Abnormal   Collection Time    08/09/12  9:15 PM      Result Value Range   WBC 8.3  4.0 - 10.5 K/uL   RBC 4.49  3.87 - 5.11 MIL/uL   Hemoglobin 11.9 (*) 12.0 - 15.0 g/dL   HCT 40.9 (*) 81.1 - 91.4 %   MCV 77.7 (*) 78.0 - 100.0 fL   MCH 26.5  26.0 - 34.0 pg   MCHC 34.1  30.0 - 36.0 g/dL   RDW 78.2  95.6 - 21.3 %   Platelets 328  150 - 400 K/uL  COMPREHENSIVE METABOLIC PANEL     Status: Abnormal   Collection Time    08/09/12  9:15 PM      Result Value Range   Sodium 136  135 - 145 mEq/L   Potassium 3.3 (*)  3.5 - 5.1 mEq/L   Chloride 103  96 - 112 mEq/L   CO2 23  19 - 32 mEq/L   Glucose, Bld 97  70 - 99 mg/dL  BUN 6  6 - 23 mg/dL   Creatinine, Ser 4.09  0.50 - 1.10 mg/dL   Calcium 8.9  8.4 - 81.1 mg/dL   Total Protein 7.5  6.0 - 8.3 g/dL   Albumin 4.0  3.5 - 5.2 g/dL   AST 18  0 - 37 U/L   ALT 10  0 - 35 U/L   Alkaline Phosphatase 55  39 - 117 U/L   Total Bilirubin 0.2 (*) 0.3 - 1.2 mg/dL   GFR calc non Af Amer >90  >90 mL/min   GFR calc Af Amer >90  >90 mL/min   Comment:            The eGFR has been calculated     using the CKD EPI equation.     This calculation has not been     validated in all clinical     situations.     eGFR's persistently     <90 mL/min signify     possible Chronic Kidney Disease.  ETHANOL     Status: None   Collection Time    08/09/12  9:15 PM      Result Value Range   Alcohol, Ethyl (B) <11  0 - 11 mg/dL   Comment:            LOWEST DETECTABLE LIMIT FOR     SERUM ALCOHOL IS 11 mg/dL     FOR MEDICAL PURPOSES ONLY  ACETAMINOPHEN LEVEL     Status: Abnormal   Collection Time    08/09/12  9:15 PM      Result Value Range   Acetaminophen (Tylenol), Serum 39.7 (*) 10 - 30 ug/mL   Comment:            THERAPEUTIC CONCENTRATIONS VARY     SIGNIFICANTLY. A RANGE OF 10-30     ug/mL MAY BE AN EFFECTIVE     CONCENTRATION FOR MANY PATIENTS.     HOWEVER, SOME ARE BEST TREATED     AT CONCENTRATIONS OUTSIDE THIS     RANGE.     ACETAMINOPHEN CONCENTRATIONS     >150 ug/mL AT 4 HOURS AFTER     INGESTION AND >50 ug/mL AT 12     HOURS AFTER INGESTION ARE     OFTEN ASSOCIATED WITH TOXIC     REACTIONS.  SALICYLATE LEVEL     Status: Abnormal   Collection Time    08/09/12  9:15 PM      Result Value Range   Salicylate Lvl <2.0 (*) 2.8 - 20.0 mg/dL  GLUCOSE, CAPILLARY     Status: None   Collection Time    08/09/12  9:45 PM      Result Value Range   Glucose-Capillary 84  70 - 99 mg/dL   Comment 1 Documented in Chart     Comment 2 Notify RN    ACETAMINOPHEN LEVEL      Status: None   Collection Time    08/09/12 11:24 PM      Result Value Range   Acetaminophen (Tylenol), Serum 21.6  10 - 30 ug/mL   Comment: SLIGHT HEMOLYSIS                THERAPEUTIC CONCENTRATIONS VARY     SIGNIFICANTLY. A RANGE OF 10-30     ug/mL MAY BE AN EFFECTIVE     CONCENTRATION FOR MANY PATIENTS.     HOWEVER, SOME ARE BEST TREATED     AT CONCENTRATIONS OUTSIDE THIS  RANGE.     ACETAMINOPHEN CONCENTRATIONS     >150 ug/mL AT 4 HOURS AFTER     INGESTION AND >50 ug/mL AT 12     HOURS AFTER INGESTION ARE     OFTEN ASSOCIATED WITH TOXIC     REACTIONS.    Physical Findings: AIMS: Facial and Oral Movements Muscles of Facial Expression: None, normal Lips and Perioral Area: None, normal Jaw: None, normal Tongue: None, normal,Extremity Movements Upper (arms, wrists, hands, fingers): None, normal Lower (legs, knees, ankles, toes): None, normal, Trunk Movements Neck, shoulders, hips: None, normal, Overall Severity Severity of abnormal movements (highest score from questions above): None, normal Incapacitation due to abnormal movements: None, normal Patient's awareness of abnormal movements (rate only patient's report): No Awareness, Dental Status Current problems with teeth and/or dentures?: No Does patient usually wear dentures?: No  CIWA:  CIWA-Ar Total: 1 COWS:  COWS Total Score: 2  Psychiatric Specialty Exam: See Psychiatric Specialty Exam and Suicide Risk Assessment completed by Attending Physician prior to discharge.  Discharge destination:  Home  Is patient on multiple antipsychotic therapies at discharge:  No   Has Patient had three or more failed trials of antipsychotic monotherapy by history:  No  Recommended Plan for Multiple Antipsychotic Therapies: NA   Discharge Orders   Future Orders Complete By Expires     Diet - low sodium heart healthy  As directed     Discharge instructions  As directed     Comments:      Take all of your medications as  directed. Be sure to keep all of your follow up appointments.  If you are unable to keep your follow up appointment, call your Doctor's office to let them know, and reschedule.  Make sure that you have enough medication to last until your appointment. Be sure to get plenty of rest. Going to bed at the same time each night will help. Try to avoid sleeping during the day.  Increase your activity as tolerated. Regular exercise will help you to sleep better and improve your mental health. Eating a heart healthy diet is recommended. Try to avoid salty or fried foods. Be sure to avoid all alcohol and illegal drugs.    Increase activity slowly  As directed         Medication List    STOP taking these medications       acetaminophen 500 MG tablet  Commonly known as:  TYLENOL      TAKE these medications     Indication   escitalopram 10 MG tablet  Commonly known as:  LEXAPRO  Take 1 tablet (10 mg total) by mouth daily.   Indication:  Depression     naproxen sodium 220 MG tablet  Commonly known as:  ANAPROX  Take 220 mg by mouth daily as needed (pain).    for pain   SEASONIQUE 0.15-0.03 &0.01 MG tablet  Generic drug:  Levonorgestrel-Ethinyl Estradiol  Take 1 tablet by mouth every evening. At 5pm    for contraception         Follow-up Information   Follow up with Laurena Slimmer - [facility] On 08/17/2012. (Wednesday, August 17, 2012 at 4 PM)    Contact information:   [address] [place], [place]   [address]  [phone]      Follow up with Dr. Lolly Mustache - [facility] On 09/13/2012. (Tuesday, September 13, 2012 at 2:30 PM)    Contact information:   [address] [place],  [place]   [address]  [phone]      Follow-up recommendations:   Activities: Resume activity as tolerated. Diet: Heart healthy low sodium diet Tests: Follow up testing will be determined by your out patient provider. Continue to work on the life style changes that could help you better  manage your mood disorder Comments:    Total Discharge Time:  Less than 30 minutes.  Signed: Rona Ravens. Mashburn RPAC 3:28 PM 08/14/2012

## 2012-08-12 NOTE — Progress Notes (Signed)
[facility] Group Notes:  (Nursing/MHT/Case Management/Adjunct)  Date:  08/12/2012  Time:  2000  Type of Therapy:  Psychoeducational Skills  Participation Level:  Active  Participation Quality:  Appropriate  Affect:  Appropriate  Cognitive:  Appropriate  Insight:  Good  Engagement in Group:  Developing/Improving  Modes of Intervention:  Education  Summary of Progress/Problems: The patient stated that she had a good day overall. She mentioned in group that she will be discharged tomorrow. She also mentioned that she learned the following while being a patient in the hospital: working on her relapse skills and establishing a good support system.   Hazle Coca S 08/12/2012, 9:57 PM

## 2012-08-12 NOTE — Progress Notes (Signed)
Adult Psychoeducational Group Note  Date:  08/12/2012 Time:  11:00AM Group Topic/Focus:  Relapse Prevention Planning:   The focus of this group is to define relapse and discuss the need for planning to combat relapse.  Participation Level:  Active  Participation Quality:  Appropriate and Attentive  Affect:  Appropriate  Cognitive:  Alert and Appropriate  Insight: Appropriate  Engagement in Group:  Engaged  Modes of Intervention:  Discussion  Additional Comments:  Pt. Was attentive and appropriate during today's group discussion. Pt was able to discuss relapse prevention. Pt. Stated that relapse for her is thinking negative and arguments. Pt. Stated that she is going to work on communication and getting a better outlook on things.   Katrina Garcia D 08/12/2012, 1:06 PM

## 2012-08-12 NOTE — Progress Notes (Signed)
Katrina Garcia is reserved, quiet and demonstrates a flat affect. She takes her meds as ordered. She completed her AM self inventory as requested and on it she wrote she denied SI within the past 24 hrs, she rated her depression and hopelessness "1/1" and she denied active SI within the previous 24 hrs.    A She is attending her groups as scheduled and she says her DC plan is to " implement techniques learned  In Thurs's group and use my support systems more".    R Safety is in place and POC moves forward.

## 2012-08-12 NOTE — Tx Team (Signed)
Interdisciplinary Treatment Plan Update   Date Reviewed:  08/12/2012  Time Reviewed:  9:41 AM  Progress in Treatment:   Attending groups: Yes Participating in groups: Yes Taking medication as prescribed: Yes  Tolerating medication: Yes Family/Significant other contact made: Yes, contact made with mother Patient understands diagnosis: Yes  Discussing patient identified problems/goals with staff: Yes Medical problems stabilized or resolved: Yes Denies suicidal/homicidal ideation: Yes Patient has not harmed self or others: Yes  For review of initial/current patient goals, please see plan of care.  Estimated Length of Stay:  1 day  Reasons for Continued Hospitalization:   Medication stabilization   New Problems/Goals identified:    Discharge Plan or Barriers:   Home with outpatient follow up [facility]  Additional Comments:  N/A Attendees:  Patient:  08/12/2012 9:41 AM   Signature: Mervyn Gay, MD 08/12/2012 9:41 AM  Signature:  Verne Spurr, PA 08/12/2012 9:41 AM  Signature: Harold Barban, RN 08/12/2012 9:41 AM  Signature:Beverly Terrilee Croak, RN 08/12/2012 9:41 AM  Signature:  Neill Loft RN 08/12/2012 9:41 AM  Signature:  Juline Patch, LCSW 08/12/2012 9:41 AM  Signature:  Reyes Ivan, LCSW 08/12/2012 9:41 AM  Signature:  Maseta Dorley,Care Coordinator 08/12/2012 9:41 AM  Signature: 08/12/2012 9:41 AM  Signature:    Signature:    Signature:      Scribe for Treatment Team:   Juline Patch,  08/12/2012 9:41 AM

## 2012-08-12 NOTE — Progress Notes (Signed)
[facility] Group Notes:  (Nursing/MHT/Case Management/Adjunct)  Date:  08/12/2012  Time:  10:33 AM  Type of Therapy:  Therapeutic Activity  Participation Level:  Active  Participation Quality:  Appropriate and Sharing  Affect:  Appropriate  Cognitive:  Appropriate  Insight:  Appropriate  Engagement in Group:  Engaged  Modes of Intervention:  Activity and Socialization  Summary of Progress/Problems:  Katrina Garcia 08/12/2012, 10:33 AM

## 2012-08-12 NOTE — Progress Notes (Signed)
[facility] MD Progress Note  08/12/2012 12:44 PM Katrina Garcia  MRN:  [id number] Subjective: "I love the groups!" met with the patient who is up and active in the unit millieu. She feels optimistic about her recovery and notes that she is learning a lot from the coping skills groups regarding communication. She rates her depression as a 1/10 and her anxiety as a 0/10. Diagnosis:  MDD (major depressive disorder)   ADL's:  Intact  Sleep: Good  Appetite:  Good  Suicidal Ideation:  decreasing Homicidal Ideation:  denies AEB (as evidenced by): patient's report of decreasing symptoms on self inventory.  Psychiatric Specialty Exam: Review of Systems  Constitutional: Negative.  Negative for fever, chills, weight loss, malaise/fatigue and diaphoresis.  HENT: Negative for congestion and sore throat.   Eyes: Negative for blurred vision, double vision and photophobia.  Respiratory: Negative for cough, shortness of breath and wheezing.   Cardiovascular: Negative for chest pain, palpitations and PND.  Gastrointestinal: Negative for heartburn, nausea, vomiting, abdominal pain, diarrhea and constipation.  Musculoskeletal: Negative for myalgias, joint pain and falls.  Neurological: Negative for dizziness, tingling, tremors, sensory change, speech change, focal weakness, seizures, loss of consciousness, weakness and headaches.  Endo/Heme/Allergies: Negative for polydipsia. Does not bruise/bleed easily.  Psychiatric/Behavioral: Negative for depression, suicidal ideas, hallucinations, memory loss and substance abuse. The patient is not nervous/anxious and does not have insomnia.     Blood pressure 139/82, pulse 112, temperature 98.1 F (36.7 C), temperature source Oral, resp. rate 18, height 5' 1.5" (1.562 m), weight 76.658 kg (169 lb), SpO2 98.00%.Body mass index is 31.42 kg/(m^2).  General Appearance: Fairly Groomed  Patent attorney::  Fair  Speech:  Clear and Coherent  Volume:  Normal  Mood:  Anxious  Affect:   Congruent  Thought Process:  Goal Directed  Orientation:  Full (Time, Place, and Person)  Thought Content:  Paranoid Ideation regarding her current status with her boyfriend.  Suicidal Thoughts:  No  Homicidal Thoughts:  No  Memory:  NA  Judgement:  Impaired  Insight:  Lacking  Psychomotor Activity:  Normal  Concentration:  Fair  Recall:  Fair  Akathisia:  No  Handed:  Right  AIMS (if indicated):     Assets:  Communication Skills Desire for Improvement Financial Resources/Insurance Housing Social Support  Sleep:  Number of Hours: 6.25   Current Medications: Current Facility-Administered Medications  Medication Dose Route Frequency Provider Last Rate Last Dose  . acetaminophen (TYLENOL) tablet 650 mg  650 mg Oral Q6H PRN Rachael Fee, MD      . alum & mag hydroxide-simeth (MAALOX/MYLANTA) 200-200-20 MG/5ML suspension 30 mL  30 mL Oral Q4H PRN Rachael Fee, MD      . escitalopram (LEXAPRO) tablet 10 mg  10 mg Oral Daily Nehemiah Settle, MD   10 mg at 08/12/12 0806  . Levonorgestrel-Ethinyl Estradiol (AMETHIA,CAMRESE) 0.15-0.03 &0.01 MG tablet 1 tablet  1 tablet Oral q1800 Kerry Hough, PA-C   1 tablet at 08/11/12 1722  . magnesium hydroxide (MILK OF MAGNESIA) suspension 30 mL  30 mL Oral Daily PRN Rachael Fee, MD      . traZODone (DESYREL) tablet 50 mg  50 mg Oral QHS PRN,MR X 1 Rachael Fee, MD   50 mg at 08/10/12 2209    Lab Results: No results found for this or any previous visit (from the past 48 hour(s)).  Physical Findings: AIMS: Facial and Oral Movements Muscles of Facial Expression: None, normal Lips and Perioral  Area: None, normal Jaw: None, normal Tongue: None, normal,Extremity Movements Upper (arms, wrists, hands, fingers): None, normal Lower (legs, knees, ankles, toes): None, normal, Trunk Movements Neck, shoulders, hips: None, normal, Overall Severity Severity of abnormal movements (highest score from questions above): None,  normal Incapacitation due to abnormal movements: None, normal Patient's awareness of abnormal movements (rate only patient's report): No Awareness, Dental Status Current problems with teeth and/or dentures?: No Does patient usually wear dentures?: No  CIWA:  CIWA-Ar Total: 1 COWS:  COWS Total Score: 2  Treatment Plan Summary: Daily contact with patient to assess and evaluate symptoms and progress in treatment Medication management  Plan: 1. Continue crisis management and stabilization. 2. Medication management to reduce current symptoms to base line and improve patient's overall level of functioning 3. Treat health problems as indicated. 4. Develop treatment plan to decrease risk of relapse upon discharge and the need for readmission. 5. Psycho-social education regarding relapse prevention and self care. 6. Health care follow up as needed for medical problems. 7. Continue home medications where appropriate. 8. ELOS: D/C Saturday AM if no complications.   Medical Decision Making Problem Points:  Established problem, stable/improving (1) Data Points:  Review or order medicine tests (1)  I certify that inpatient services furnished can reasonably be expected to improve the patient's condition.  Rona Ravens. [place] RPAC 3:23 PM 08/12/2012  Reviewed the information documented and agree with the treatment plan.  Anora Schwenke,JANARDHAHA R. 08/12/2012 7:54 PM

## 2012-08-12 NOTE — [facility] Group Notes (Signed)
[facility] LCSW Group Therapy  Feelings Around Relapse 1:15 -2:30        08/12/2012  11:41 AM   Type of Therapy:  Group Therapy  Participation Level:  Appropriate  Participation Quality:  Appropriate  Affect:  Appropriate  Cognitive:  Attentive Appropriate  Insight:  Engaged  Engagement in Therapy:  Engaged  Modes of Intervention:  Discussion Exploration Problem-Solving Supportive  Summary of Progress/Problems:  The topic for today was feelings around relapse.  Patient advised relapse would be isolating herself from others and having feelings of insecurity.   Patient processed feelings toward relapse and was able to relate to peers. Patient identified coping skills that can be used to prevent a relapse.   Wynn Banker 08/12/2012 11:41 AM

## 2012-08-12 NOTE — [facility] Group Notes (Signed)
[facility] LCSW Aftercare Discharge Planning Group Note   08/12/2012 11:32 AM    Participation Quality:  Appropraite  Mood/Affect:  Appropriate  Depression Rating:  1  Anxiety Rating:  1  Thoughts of Suicide:  No  Will you contract for safety?   NA  Current AVH:  No  Plan for Discharge/Comments:  Patient attending discharge planning group and actively participated in group. Patient reports she is followed for [facility] for therapy.  She will need referral for medications. CSW provided all participants with daily workbook and information on services offered by [facility].   Transportation Means: Patient has transportation.   Supports:  Patient has a support system.   Katrina Garcia, Joesph July

## 2012-08-12 NOTE — Progress Notes (Signed)
Patient ID: Katrina Garcia, female   DOB: 1985/09/10, 27 y.o.   MRN: [id number]  D: Pt denies SI/HI/AVH/pain at this time. Pt is pleasant and cooperative. Pt quiet, but observed in dayroom interacting and watching TV. Pt stated she had a good day "full of energy".  A: Pt was offered support and encouragement. Pt was given scheduled medications. Pt was encourage to attend groups. Q 15 minute checks were done for safety.   R:Pt attends groups and interacts well with peers and staff. Pt is taking medication. Pt has no complaints at this time.Pt receptive to treatment and safety maintained on unit.

## 2012-08-12 NOTE — Progress Notes (Signed)
[facility] Adult Case Management Discharge Plan :  Will you be returning to the same living situation after discharge: Yes,  Patient is returning to her home. At discharge, do you have transportation home?:Yes,  Patient will arrange transportation home. Do you have the ability to pay for your medications:Yes,  Patient can afford co-pay  Release of information consent forms completed and in the chart;  Patient's signature needed at discharge.  Patient to Follow up at: Follow-up Information   Follow up with Laurena Slimmer - [facility] On 08/17/2012. (Wednesday, August 17, 2012 at 4 PM)    Contact information:   [address] [place], [place]   [address]  [phone]      Follow up with Dr. Lolly Mustache - [facility] On 09/13/2012. (Tuesday, September 13, 2012 at 2:30 PM)    Contact information:   [address] [place], [place]   [address]  [phone]      Patient denies SI/HI:   Patient no longer endorsing SI/HI or other thoughts of self harm.     Safety Planning and Suicide Prevention discussed: .Reviewed with all patients during discharge planning group  Jiya Kissinger, Joesph July 08/12/2012, 4:20 PM

## 2012-08-13 ENCOUNTER — Encounter (HOSPITAL_COMMUNITY): Payer: Self-pay | Admitting: Registered Nurse

## 2012-08-13 DIAGNOSIS — F4310 Post-traumatic stress disorder, unspecified: Secondary | ICD-10-CM

## 2012-08-13 NOTE — Progress Notes (Signed)
Patient ID: Katrina Garcia, female   DOB: 15-Oct-1985, 27 y.o.   MRN: [id number] [facility] MD Progress Note  08/13/2012 10:12 AM Katrina Garcia  MRN:  [id number] Subjective:  Patient states that everything is going good.  "I like the groups cause I'm able to talk and stuff.  Patient states that the depression is better. Patient states that she is ready to go home today.  States that she has gotten out patient therapy set up.   Diagnosis:  MDD (major depressive disorder)   ADL's:  Intact  Sleep: Good  Appetite:  Good  Suicidal Ideation:  decreasing Homicidal Ideation:  denies AEB (as evidenced by): Patient continues to participate in group sessions and tolerating medication without adverse effects.  Patient ready to go home and feeling much better.    Psychiatric Specialty Exam: Review of Systems  Constitutional: Negative.  Negative for fever, chills, weight loss, malaise/fatigue and diaphoresis.  HENT: Negative for congestion and sore throat.   Eyes: Negative for blurred vision, double vision and photophobia.  Respiratory: Negative for cough, shortness of breath and wheezing.   Cardiovascular: Negative for chest pain, palpitations and PND.  Gastrointestinal: Negative for heartburn, nausea, vomiting, abdominal pain, diarrhea and constipation.  Musculoskeletal: Negative for myalgias, joint pain and falls.  Neurological: Negative for dizziness, tingling, tremors, sensory change, speech change, focal weakness, seizures, loss of consciousness, weakness and headaches.  Endo/Heme/Allergies: Negative for polydipsia. Does not bruise/bleed easily.  Psychiatric/Behavioral: Positive for depression and suicidal ideas ("Not really having thoughts of killing my self, just still little depressed I guess."). Negative for hallucinations, memory loss and substance abuse. The patient is nervous/anxious. The patient does not have insomnia.     Blood pressure 147/100, pulse 97, temperature 98.3 F (36.8 C), temperature  source Oral, resp. rate 16, height 5' 1.5" (1.562 m), weight 76.658 kg (169 lb), SpO2 98.00%.Body mass index is 31.42 kg/(m^2).  General Appearance: Fairly Groomed  Patent attorney::  Fair  Speech:  Clear and Coherent  Volume:  Normal  Mood:  Anxious  Affect:  Congruent  Thought Process:  Goal Directed  Orientation:  Full (Time, Place, and Person)  Thought Content:  WDL   Suicidal Thoughts:  No  Homicidal Thoughts:  No  Memory:  NA  Judgement:  Good  Insight:  Lacking  Psychomotor Activity:  Normal  Concentration:  Fair  Recall:  Fair  Akathisia:  No  Handed:  Right  AIMS (if indicated):     Assets:  Communication Skills Desire for Improvement Financial Resources/Insurance Housing Social Support  Sleep:  Number of Hours: 6.25   Current Medications: Current Facility-Administered Medications  Medication Dose Route Frequency Provider Last Rate Last Dose  . acetaminophen (TYLENOL) tablet 650 mg  650 mg Oral Q6H PRN Rachael Fee, MD      . alum & mag hydroxide-simeth (MAALOX/MYLANTA) 200-200-20 MG/5ML suspension 30 mL  30 mL Oral Q4H PRN Rachael Fee, MD      . escitalopram (LEXAPRO) tablet 10 mg  10 mg Oral Daily Nehemiah Settle, MD   10 mg at 08/12/12 0806  . Levonorgestrel-Ethinyl Estradiol (AMETHIA,CAMRESE) 0.15-0.03 &0.01 MG tablet 1 tablet  1 tablet Oral q1800 Kerry Hough, PA-C   1 tablet at 08/11/12 1722  . magnesium hydroxide (MILK OF MAGNESIA) suspension 30 mL  30 mL Oral Daily PRN Rachael Fee, MD      . traZODone (DESYREL) tablet 50 mg  50 mg Oral QHS PRN,MR X 1 Rachael Fee, MD  50 mg at 08/10/12 2209    Lab Results: No results found for this or any previous visit (from the past 48 hour(s)).  Physical Findings: AIMS: Facial and Oral Movements Muscles of Facial Expression: None, normal Lips and Perioral Area: None, normal Jaw: None, normal Tongue: None, normal,Extremity Movements Upper (arms, wrists, hands, fingers): None, normal Lower (legs,  knees, ankles, toes): None, normal, Trunk Movements Neck, shoulders, hips: None, normal, Overall Severity Severity of abnormal movements (highest score from questions above): None, normal Incapacitation due to abnormal movements: None, normal Patient's awareness of abnormal movements (rate only patient's report): No Awareness, Dental Status Current problems with teeth and/or dentures?: No Does patient usually wear dentures?: No  CIWA:  CIWA-Ar Total: 1 COWS:  COWS Total Score: 2  Treatment Plan Summary: Daily contact with patient to assess and evaluate symptoms and progress in treatment Medication management  Plan:  Will discharge today  1. Continue crisis management and stabilization. 2. Medication management to reduce current symptoms to base line and improve patient's overall level of functioning 3. Treat health problems as indicated. 4. Develop treatment plan to decrease risk of relapse upon discharge and the need for readmission. 5. Psycho-social education regarding relapse prevention and self care. 6. Health care follow up as needed for medical problems. 7. Continue home medications where appropriate. 8. ELOS: D/C Saturday AM if no complications.   Medical Decision Making Problem Points:  Established problem, stable/improving (1), Review of last therapy session (1) and Review of psycho-social stressors (1) Data Points:  Review or order clinical lab tests (1) Review and summation of old records (2) Review of medication regiment & side effects (2)    Katrina Leoni, FNP-BC 08/13/2012 10:12 AM

## 2012-08-13 NOTE — Progress Notes (Signed)
Patient ID: Katrina Garcia, female   DOB: 12/11/1985, 27 y.o.   MRN: [id number] Patient denies si/hi/avh. Pt verbalizes understanding of discharge instructions, prescriptions and follow up appts were gven. Pt did not have any belongings in a locker.   Pt was walked to the lobby and was transported by a friend.

## 2012-08-13 NOTE — Progress Notes (Signed)
Psychoeducational Group Note  Date: 08/13/2012 Time:  1015  Group Topic/Focus:  Identifying Needs:   The focus of this group is to help patients identify their personal needs that have been historically problematic and identify healthy behaviors to address their needs.  Participation Level:  Active  Participation Quality:  Appropriate  Affect:  Appropriate  Cognitive:  Appropriate  Insight:  Improving  Engagement in Group:  Engaged  Additional Comments:    Dione Housekeeper
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
## 2012-08-13 NOTE — Progress Notes (Addendum)
Katrina Garcia is seen out in the hall this morning. She is pleasant, shy-like, but appropriate in her behavior and her demeanor. She takes her scheduled meds as ordered. She denies SI and / or HI within the previous 24 hrs and states her DC plan is to " use my support system and review the techniques I learned in group.   A MD completed DC order as well as DC SRA. Pt given DC AVS, stated she understood and will comply. Pt given  sample meds and prescriptions and all belongings that were previously placed in  Pt's locker are returned to her as well..   R She is DC'Katrina per POC.

## 2012-08-16 NOTE — Progress Notes (Signed)
Agree with assessment and plan Henriette Hesser A. Saylee Sherrill, M.D. 

## 2012-08-17 NOTE — Progress Notes (Signed)
Patient Discharge Instructions:  After Visit Summary (AVS):   Faxed to:  08/17/12 Discharge Summary Note:   Faxed to:  08/17/12 Psychiatric Admission Assessment Note:   Faxed to:  08/17/12 Suicide Risk Assessment - Discharge Assessment:   Faxed to:  08/17/12 Faxed/Sent to the Next Level Care provider:  08/17/12 Next Level Care Provider Has Access to the EMR, 08/17/12 Faxed to [facility] @ [phone] Records provided to [facility] via [facility]/Epic access.  Jerelene Redden, 08/17/2012, 1:17 PM

## 2012-09-13 ENCOUNTER — Ambulatory Visit (HOSPITAL_COMMUNITY): Payer: Self-pay | Admitting: Psychiatry

## 2013-03-01 ENCOUNTER — Other Ambulatory Visit (HOSPITAL_COMMUNITY)
Admission: RE | Admit: 2013-03-01 | Discharge: 2013-03-01 | Disposition: A | Discharge status: Home / Self Care | Payer: Medicaid Other | Source: Ambulatory Visit | Attending: Emergency Medicine | Admitting: Emergency Medicine

## 2013-03-01 ENCOUNTER — Encounter (HOSPITAL_COMMUNITY): Payer: Self-pay | Admitting: Emergency Medicine

## 2013-03-01 ENCOUNTER — Emergency Department (INDEPENDENT_AMBULATORY_CARE_PROVIDER_SITE_OTHER)
Admission: EM | Admit: 2013-03-01 | Discharge: 2013-03-01 | Disposition: A | Discharge status: Home / Self Care | Payer: Self-pay | Source: Home / Self Care | Attending: Family Medicine | Admitting: Family Medicine

## 2013-03-01 DIAGNOSIS — N760 Acute vaginitis: Secondary | ICD-10-CM | POA: Insufficient documentation

## 2013-03-01 DIAGNOSIS — Z113 Encounter for screening for infections with a predominantly sexual mode of transmission: Secondary | ICD-10-CM | POA: Insufficient documentation

## 2013-03-01 LAB — POCT URINALYSIS DIP (DEVICE)
Bilirubin Urine: NEGATIVE
GLUCOSE, UA: NEGATIVE mg/dL
Hgb urine dipstick: NEGATIVE
Ketones, ur: NEGATIVE mg/dL
Leukocytes, UA: NEGATIVE
NITRITE: NEGATIVE
PROTEIN: NEGATIVE mg/dL
Specific Gravity, Urine: 1.025 (ref 1.005–1.030)
UROBILINOGEN UA: 0.2 mg/dL (ref 0.0–1.0)
pH: 6.5 (ref 5.0–8.0)

## 2013-03-01 LAB — CERVICOVAGINAL ANCILLARY ONLY
Wet Prep (BD Affirm): NEGATIVE
Wet Prep (BD Affirm): NEGATIVE
Wet Prep (BD Affirm): POSITIVE — AB

## 2013-03-01 MED ORDER — CEFTRIAXONE SODIUM 250 MG IJ SOLR
INTRAMUSCULAR | Status: AC
  Filled 2013-03-01: qty 250

## 2013-03-01 MED ORDER — AZITHROMYCIN 250 MG PO TABS
1000.0000 mg | ORAL_TABLET | Freq: Once | ORAL | Status: AC
Start: 2013-03-01 — End: 2013-03-01
  Administered 2013-03-01: 1000 mg via ORAL

## 2013-03-01 MED ORDER — AZITHROMYCIN 250 MG PO TABS
ORAL_TABLET | ORAL | Status: AC
Start: 2013-03-01 — End: 2013-03-01
  Filled 2013-03-01: qty 4

## 2013-03-01 MED ORDER — CEFTRIAXONE SODIUM 250 MG IJ SOLR
250.0000 mg | Freq: Once | INTRAMUSCULAR | Status: AC
  Administered 2013-03-01: 250 mg via INTRAMUSCULAR

## 2013-03-01 MED ORDER — LIDOCAINE HCL (PF) 1 % IJ SOLN
INTRAMUSCULAR | Status: AC
  Filled 2013-03-01: qty 5

## 2013-03-01 NOTE — Discharge Instructions (Signed)
If your labs indicate the need for additional treatment, you will be notified by phone and additional medications can be called into your pharmacy

## 2013-03-01 NOTE — ED Provider Notes (Signed)
CSN: [id number]     Arrival date & time 03/01/13  0935 History   First MD Initiated Contact with Patient 03/01/13 1000     Chief Complaint  Patient presents with  . Vaginal Itching     (Consider location/radiation/quality/duration/timing/severity/associated sxs/prior Treatment) HPI Comments: Denies unprotected intercourse. Limited improvement with OTC therapies for vaginal yeast. G0P0 PCP: none GYN: none LNMP: 02/08/2013  Patient is a 28 y.o. female presenting with vaginal discharge. The history is provided by the patient.  Vaginal Discharge Quality:  Milky Severity:  Moderate Onset quality:  Gradual Duration:  3 weeks Timing:  Constant Progression:  Unchanged Chronicity:  New Context: not recent antibiotic use   Relieved by:  None tried Ineffective treatments:  None tried Associated symptoms: dysuria and vaginal itching   Associated symptoms: no abdominal pain, no fever, no genital lesions, no rash, no urinary frequency, no urinary hesitancy, no urinary incontinence and no vomiting   Associated symptoms comment:  +pelvic cramping   Past Medical History  Diagnosis Date  . Fibroid   . HPV (human papilloma virus) infection   . MDD (major depressive disorder)   . PTSD (post-traumatic stress disorder)    Past Surgical History  Procedure Laterality Date  . Wisdom tooth extraction     History reviewed. No pertinent family history. History  Substance Use Topics  . Smoking status: Former Smoker    Types: Cigarettes    Quit date: 09/06/2011  . Smokeless tobacco: Not on file  . Alcohol Use: No   OB History   Grav Para Term Preterm Abortions TAB SAB Ect Mult Living   0              Review of Systems  Constitutional: Negative for fever.  Gastrointestinal: Negative for vomiting and abdominal pain.  Genitourinary: Positive for dysuria and vaginal discharge. Negative for bladder incontinence and hesitancy.  All other systems reviewed and are negative.      Allergies   Review of patient's allergies indicates no known allergies.  Home Medications   Current Outpatient Rx  Name  Route  Sig  Dispense  Refill  . escitalopram (LEXAPRO) 10 MG tablet   Oral   Take 1 tablet (10 mg total) by mouth daily.   30 tablet   0   . Levonorgestrel-Ethinyl Estradiol (SEASONIQUE) 0.15-0.03 &0.01 MG tablet   Oral   Take 1 tablet by mouth every evening. At 5pm         . naproxen sodium (ANAPROX) 220 MG tablet   Oral   Take 220 mg by mouth daily as needed (pain).          BP 131/81  Pulse 94  Temp(Src) 97.8 F (36.6 C) (Oral)  Resp 16  SpO2 100% Physical Exam  Nursing note and vitals reviewed. Constitutional: She is oriented to person, place, and time. She appears well-developed and well-nourished. No distress.  HENT:  Head: Normocephalic and atraumatic.  Eyes: Conjunctivae are normal.  Neck: Normal range of motion. Neck supple.  Cardiovascular: Normal rate.   Pulmonary/Chest: Effort normal.  Abdominal: Soft. Bowel sounds are normal. She exhibits no distension. There is no tenderness.  Genitourinary: Uterus normal. Pelvic exam was performed with patient supine. There is no rash, tenderness or lesion on the right labia. There is no rash, tenderness or lesion on the left labia. Cervix exhibits discharge. Cervix exhibits no motion tenderness and no friability. Right adnexum displays no mass, no tenderness and no fullness. Left adnexum displays no mass, no tenderness  and no fullness. No erythema, tenderness or bleeding around the vagina. No foreign body around the vagina. Vaginal discharge found.  Musculoskeletal: Normal range of motion.  Neurological: She is alert and oriented to person, place, and time.  Skin: Skin is warm and dry.  Psychiatric: She has a normal mood and affect. Her behavior is normal.    ED Course  Procedures (including critical care time) Labs Review Labs Reviewed  POCT URINALYSIS DIP (DEVICE)  CERVICOVAGINAL ANCILLARY ONLY    Imaging Review No results found.    MDM   Final diagnoses:  Vaginitis  Discussed treatment options with patient and she wishes to have treatment for GC and chlamydia while at [facility] today rather than waiting on lab results. No clinical indications of PID.    [place], [place] 03/01/13 [place], [place] 03/01/13 1037

## 2013-03-01 NOTE — ED Notes (Signed)
C/o 3 weeks of vaginal d/c and itching not resolved w OTC yeast treatment; NAD

## 2013-03-02 ENCOUNTER — Telehealth (HOSPITAL_COMMUNITY): Payer: Self-pay | Admitting: *Deleted

## 2013-03-02 LAB — CERVICOVAGINAL ANCILLARY ONLY
CHLAMYDIA, DNA PROBE: NEGATIVE
NEISSERIA GONORRHEA: NEGATIVE

## 2013-03-02 MED ORDER — METRONIDAZOLE 500 MG PO TABS
500.0000 mg | ORAL_TABLET | Freq: Two times a day (BID) | ORAL | Status: DC
Start: 2013-03-02 — End: 2013-06-01

## 2013-03-02 NOTE — ED Notes (Signed)
GC/Chlamydia neg., Affirm: Candida and Trich neg., Gardnerella pos. 2/25 Message sent to [facility].  She e-prescribed Flagyl to the [facility] on [address].  I called pt. and left a message to call.  Call 1. Roselyn Meier 03/02/2013

## 2013-03-03 NOTE — ED Provider Notes (Signed)
Medical screening examination/treatment/procedure(s) were performed by a resident physician or non-physician practitioner and as the supervising physician I was immediately available for consultation/collaboration.  Lynne Leader, MD    Gregor Hams, MD 03/03/13 [phone]

## 2013-03-04 NOTE — ED Notes (Signed)
I called pt. Pt. verified x 2 and given results.  Pt. told she needs Flagyl for bacterial vaginosis.   Pt. instructed to no alcohol while taking this medication.  Pt. told where to pick up her Rx. No questions. Roselyn Meier 03/04/2013

## 2013-03-19 NOTE — ED Notes (Signed)
Pt. called on VM and said the Rx. Flagyl was not at the pharmacy. Ninfa Linden RN took the message.  I called back to make sure the pharmacy was correct.  Pt. said she is still having symptoms.  I told her I would call it in and verified the pharmacy.  Rx. called to [facility] on  [address] at [address]. Roselyn Meier 03/19/2013

## 2013-06-01 ENCOUNTER — Encounter (HOSPITAL_COMMUNITY): Payer: Self-pay | Admitting: General Practice

## 2013-06-01 ENCOUNTER — Inpatient Hospital Stay (HOSPITAL_COMMUNITY)
Admission: AD | Admit: 2013-06-01 | Discharge: 2013-06-01 | Disposition: A | Discharge status: Home / Self Care | Payer: Medicaid Other | Source: Ambulatory Visit | Attending: Obstetrics & Gynecology | Admitting: Obstetrics & Gynecology

## 2013-06-01 DIAGNOSIS — R109 Unspecified abdominal pain: Secondary | ICD-10-CM | POA: Diagnosis present

## 2013-06-01 DIAGNOSIS — Z87891 Personal history of nicotine dependence: Secondary | ICD-10-CM | POA: Diagnosis not present

## 2013-06-01 DIAGNOSIS — O2390 Unspecified genitourinary tract infection in pregnancy, unspecified trimester: Secondary | ICD-10-CM | POA: Diagnosis not present

## 2013-06-01 DIAGNOSIS — B9689 Other specified bacterial agents as the cause of diseases classified elsewhere: Secondary | ICD-10-CM | POA: Diagnosis not present

## 2013-06-01 DIAGNOSIS — N760 Acute vaginitis: Secondary | ICD-10-CM | POA: Diagnosis not present

## 2013-06-01 DIAGNOSIS — A499 Bacterial infection, unspecified: Secondary | ICD-10-CM | POA: Diagnosis not present

## 2013-06-01 DIAGNOSIS — Z3201 Encounter for pregnancy test, result positive: Secondary | ICD-10-CM

## 2013-06-01 LAB — URINALYSIS, ROUTINE W REFLEX MICROSCOPIC
Bilirubin Urine: NEGATIVE
GLUCOSE, UA: NEGATIVE mg/dL
Hgb urine dipstick: NEGATIVE
Ketones, ur: 15 mg/dL — AB
Leukocytes, UA: NEGATIVE
NITRITE: NEGATIVE
PH: 6 (ref 5.0–8.0)
Protein, ur: NEGATIVE mg/dL
Urobilinogen, UA: 0.2 mg/dL (ref 0.0–1.0)

## 2013-06-01 LAB — WET PREP, GENITAL
Trich, Wet Prep: NONE SEEN
Yeast Wet Prep HPF POC: NONE SEEN

## 2013-06-01 LAB — POCT PREGNANCY, URINE: PREG TEST UR: POSITIVE — AB

## 2013-06-01 MED ORDER — PRENATAL VITAMINS 0.8 MG PO TABS: 1.0000 | ORAL_TABLET | Freq: Every day | ORAL | Status: DC

## 2013-06-01 MED ORDER — METRONIDAZOLE 500 MG PO TABS: 500.0000 mg | ORAL_TABLET | Freq: Two times a day (BID) | ORAL | Status: DC

## 2013-06-01 NOTE — MAU Note (Signed)
Here for pregnancy test.  Two positive HPT yesterday.  No vaginal bleeding.  Increased smelly vaginal discharge which is itchy and burns.  Some lower abd cramping like she's going to start her period and pressure in the back.  LMP 04-19-13.

## 2013-06-01 NOTE — Discharge Instructions (Signed)
Pregnancy - First Trimester During sexual intercourse, millions of sperm go into the vagina. Only 1 sperm will penetrate and fertilize the female egg while it is in the Fallopian tube. One week later, the fertilized egg implants into the wall of the uterus. An embryo begins to develop into a baby. At 6 to 8 weeks, the eyes and face are formed and the heartbeat can be seen on ultrasound. At the end of 12 weeks (first trimester), all the baby's organs are formed. Now that you are pregnant, you will want to do everything you can to have a healthy baby. Two of the most important things are to get good prenatal care and follow your caregiver's instructions. Prenatal care is all the medical care you receive before the baby's birth. It is given to prevent, find, and treat problems during the pregnancy and childbirth. PRENATAL EXAMS  During prenatal visits, your weight, blood pressure, and urine are checked. This is done to make sure you are healthy and progressing normally during the pregnancy.  A pregnant woman should gain 25 to 35 pounds during the pregnancy. However, if you are overweight or underweight, your caregiver will advise you regarding your weight.  Your caregiver will ask and answer questions for you.  Blood work, cervical cultures, other necessary tests, and a Pap test are done during your prenatal exams. These tests are done to check on your health and the probable health of your baby. Tests are strongly recommended and done for HIV with your permission. This is the virus that causes AIDS. These tests are done because medicines can be given to help prevent your baby from being born with this infection should you have been infected without knowing it. Blood work is also used to find out your blood type, previous infections, and follow your blood levels (hemoglobin).  Low hemoglobin (anemia) is common during pregnancy. Iron and vitamins are given to help prevent this. Later in the pregnancy, blood  tests for diabetes will be done along with any other tests if any problems develop.  You may need other tests to make sure you and the baby are doing well. CHANGES DURING THE FIRST TRIMESTER  Your body goes through many changes during pregnancy. They vary from person to person. Talk to your caregiver about changes you notice and are concerned about. Changes can include:  Your menstrual period stops.  The egg and sperm carry the genes that determine what you look like. Genes from you and your partner are forming a baby. The female genes determine whether the baby is a boy or a girl.  Your body increases in girth and you may feel bloated.  Feeling sick to your stomach (nauseous) and throwing up (vomiting). If the vomiting is uncontrollable, call your caregiver.  Your breasts will begin to enlarge and become tender.  Your nipples may stick out more and become darker.  The need to urinate more. Painful urination may mean you have a bladder infection.  Tiring easily.  Loss of appetite.  Cravings for certain kinds of food.  At first, you may gain or lose a couple of pounds.  You may have changes in your emotions from day to day (excited to be pregnant or concerned something may go wrong with the pregnancy and baby).  You may have more vivid and strange dreams. HOME CARE INSTRUCTIONS   It is very important to avoid all smoking, alcohol and non-prescribed drugs during your pregnancy. These affect the formation and growth of the baby.  Avoid chemicals while pregnant to ensure the delivery of a healthy infant.  Start your prenatal visits by the 12th week of pregnancy. They are usually scheduled monthly at first, then more often in the last 2 months before delivery. Keep your caregiver's appointments. Follow your caregiver's instructions regarding medicine use, blood and lab tests, exercise, and diet.  During pregnancy, you are providing food for you and your baby. Eat regular, well-balanced  meals. Choose foods such as meat, fish, milk and other low fat dairy products, vegetables, fruits, and whole-grain breads and cereals. Your caregiver will tell you of the ideal weight gain.  You can help morning sickness by keeping soda crackers at the bedside. Eat a couple before arising in the morning. You may want to use the crackers without salt on them.  Eating 4 to 5 small meals rather than 3 large meals a day also may help the nausea and vomiting.  Drinking liquids between meals instead of during meals also seems to help nausea and vomiting.  A physical sexual relationship may be continued throughout pregnancy if there are no other problems. Problems may be early (premature) leaking of amniotic fluid from the membranes, vaginal bleeding, or belly (abdominal) pain.  Exercise regularly if there are no restrictions. Check with your caregiver or physical therapist if you are unsure of the safety of some of your exercises. Greater weight gain will occur in the last 2 trimesters of pregnancy. Exercising will help:  Control your weight.  Keep you in shape.  Prepare you for labor and delivery.  Help you lose your pregnancy weight after you deliver your baby.  Wear a good support or jogging bra for breast tenderness during pregnancy. This may help if worn during sleep too.  Ask when prenatal classes are available. Begin classes when they are offered.  Do not use hot tubs, steam rooms, or saunas.  Wear your seat belt when driving. This protects you and your baby if you are in an accident.  Avoid raw meat, uncooked cheese, cat litter boxes, and soil used by cats throughout the pregnancy. These carry germs that can cause birth defects in the baby.  The first trimester is a good time to visit your dentist for your dental health. Getting your teeth cleaned is okay. Use a softer toothbrush and brush gently during pregnancy.  Ask for help if you have financial, counseling, or nutritional needs  during pregnancy. Your caregiver will be able to offer counseling for these needs as well as refer you for other special needs.  Do not take any medicines or herbs unless told by your caregiver.  Inform your caregiver if there is any mental or physical domestic violence.  Make a list of emergency phone numbers of family, friends, hospital, and police and fire departments.  Write down your questions. Take them to your prenatal visit.  Do not douche.  Do not cross your legs.  If you have to stand for long periods of time, rotate you feet or take small steps in a circle.  You may have more vaginal secretions that may require a sanitary pad. Do not use tampons or scented sanitary pads. MEDICINES AND DRUG USE IN PREGNANCY  Take prenatal vitamins as directed. The vitamin should contain 1 milligram of folic acid. Keep all vitamins out of reach of children. Only a couple vitamins or tablets containing iron may be fatal to a baby or young child when ingested.  Avoid use of all medicines, including herbs, over-the-counter medicines, not  prescribed or suggested by your caregiver. Only take over-the-counter or prescription medicines for pain, discomfort, or fever as directed by your caregiver. Do not use aspirin, ibuprofen, or naproxen unless directed by your caregiver.  Let your caregiver also know about herbs you may be using.  Alcohol is related to a number of birth defects. This includes fetal alcohol syndrome. All alcohol, in any form, should be avoided completely. Smoking will cause low birth rate and premature babies.  Street or illegal drugs are very harmful to the baby. They are absolutely forbidden. A baby born to an addicted mother will be addicted at birth. The baby will go through the same withdrawal an adult does.  Let your caregiver know about any medicines that you have to take and for what reason you take them. SEEK MEDICAL CARE IF:  You have any concerns or worries during your  pregnancy. It is better to call with your questions if you feel they cannot wait, rather than worry about them. SEEK IMMEDIATE MEDICAL CARE IF:   An unexplained oral temperature above 102 F (38.9 C) develops, or as your caregiver suggests.  You have leaking of fluid from the vagina (birth canal). If leaking membranes are suspected, take your temperature and inform your caregiver of this when you call.  There is vaginal spotting or bleeding. Notify your caregiver of the amount and how many pads are used.  You develop a bad smelling vaginal discharge with a change in the color.  You continue to feel sick to your stomach (nauseated) and have no relief from remedies suggested. You vomit blood or coffee ground-like materials.  You lose more than 2 pounds of weight in 1 week.  You gain more than 2 pounds of weight in 1 week and you notice swelling of your face, hands, feet, or legs.  You gain 5 pounds or more in 1 week (even if you do not have swelling of your hands, face, legs, or feet).  You get exposed to [place] measles and have never had them.  You are exposed to fifth disease or chickenpox.  You develop belly (abdominal) pain. Round ligament discomfort is a common non-cancerous (benign) cause of abdominal pain in pregnancy. Your caregiver still must evaluate this.  You develop headache, fever, diarrhea, pain with urination, or shortness of breath.  You fall or are in a car accident or have any kind of trauma.  There is mental or physical violence in your home. Document Released: 12/16/2000 Document Revised: 09/16/2011 Document Reviewed: 06/19/2008 [facility] Patient Information 2014 [place]. Bacterial Vaginosis Bacterial vaginosis is a vaginal infection that occurs when the normal balance of bacteria in the vagina is disrupted. It results from an overgrowth of certain bacteria. This is the most common vaginal infection in women of childbearing age. Treatment is important to  prevent complications, especially in pregnant women, as it can cause a premature delivery. CAUSES  Bacterial vaginosis is caused by an increase in harmful bacteria that are normally present in smaller amounts in the vagina. Several different kinds of bacteria can cause bacterial vaginosis. However, the reason that the condition develops is not fully understood. RISK FACTORS Certain activities or behaviors can put you at an increased risk of developing bacterial vaginosis, including:  Having a new sex partner or multiple sex partners.  Douching.  Using an intrauterine device (IUD) for contraception. Women do not get bacterial vaginosis from toilet seats, bedding, swimming pools, or contact with objects around them. SIGNS AND SYMPTOMS  Some women  with bacterial vaginosis have no signs or symptoms. Common symptoms include:  Grey vaginal discharge.  A fishlike odor with discharge, especially after sexual intercourse.  Itching or burning of the vagina and vulva.  Burning or pain with urination. DIAGNOSIS  Your health care provider will take a medical history and examine the vagina for signs of bacterial vaginosis. A sample of vaginal fluid may be taken. Your health care provider will look at this sample under a microscope to check for bacteria and abnormal cells. A vaginal pH test may also be done.  TREATMENT  Bacterial vaginosis may be treated with antibiotic medicines. These may be given in the form of a pill or a vaginal cream. A second round of antibiotics may be prescribed if the condition comes back after treatment.  HOME CARE INSTRUCTIONS   Only take over-the-counter or prescription medicines as directed by your health care provider.  If antibiotic medicine was prescribed, take it as directed. Make sure you finish it even if you start to feel better.  Do not have sex until treatment is completed.  Tell all sexual partners that you have a vaginal infection. They should see their  health care provider and be treated if they have problems, such as a mild rash or itching.  Practice safe sex by using condoms and only having one sex partner. SEEK MEDICAL CARE IF:   Your symptoms are not improving after 3 days of treatment.  You have increased discharge or pain.  You have a fever. MAKE SURE YOU:   Understand these instructions.  Will watch your condition.  Will get help right away if you are not doing well or get worse. FOR MORE INFORMATION  Centers for Disease Control and Prevention, Division of STD Prevention: [employer]raud[employer] American Sexual Health Association (ASHA): www.ashastd.org  Document Released: 12/22/2004 Document Revised: 10/12/2012 Document Reviewed: 08/03/2012 [facility] Patient Information 2014 [place].

## 2013-06-01 NOTE — MAU Provider Note (Signed)
Attestation of Attending Supervision of Advanced Practitioner (CNM/NP): Evaluation and management procedures were performed by the Advanced Practitioner under my supervision and collaboration.  I have reviewed the Advanced Practitioner's note and chart, and I agree with the management and plan.  Hoyle Sauer Harraway-Smith 4:41 PM

## 2013-06-01 NOTE — MAU Provider Note (Signed)
First Provider Initiated Contact with Patient 06/01/13 1130      Chief Complaint:  Possible Pregnancy, Vaginal Discharge and Abdominal Cramping   Katrina Garcia is  28 y.o. G1P0 at [redacted]w[redacted]d presents complaining of Possible Pregnancy, Vaginal Discharge and Abdominal Cramping pt is currently 2 weeks late with a home pregnancy test that was positive. Pt has been having some mild cramping for last week and has been having vaginal discharge on and off for the last 2 weeks. Pt has tried some home creams that has not signficantly improved.  Obstetrical/Gynecological History: OB History   Grav Para Term Preterm Abortions TAB SAB Ect Mult Living   1              Past Medical History: Past Medical History  Diagnosis Date  . Fibroid   . HPV (human papilloma virus) infection   . MDD (major depressive disorder)   . PTSD (post-traumatic stress disorder)     Past Surgical History: Past Surgical History  Procedure Laterality Date  . Wisdom tooth extraction      Family History: History reviewed. No pertinent family history.  Social History: History  Substance Use Topics  . Smoking status: Former Smoker    Types: Cigarettes    Quit date: 09/06/2011  . Smokeless tobacco: Not on file  . Alcohol Use: No    Allergies: No Known Allergies  Meds:  Prescriptions prior to admission  Medication Sig Dispense Refill  . naproxen sodium (ANAPROX) 220 MG tablet Take 440 mg by mouth daily as needed (pain).         Review of Systems -   Review of Systems  No complaints at this time other than cramping and discharge. No headache, vision change, CP, SoB, n/v, d/c, or other complaints  Physical Exam  Blood pressure 138/79, pulse 77, temperature 99.3 F (37.4 C), temperature source Oral, resp. rate 16, height 5\' 2"  (1.575 m), weight 82.918 kg (182 lb 12.8 oz), last menstrual period 04/19/2013, SpO2 100.00%. GENERAL: Well-developed, well-nourished female in no acute distress.  ABDOMEN: Soft, nontender,  nondistended, gravid.  SSE: White vaginal discharge, no CMT, no Adenexal tenderness EXTREMITIES: Nontender, no edema, 2+ distal pulses.   Labs: Results for orders placed during the hospital encounter of 06/01/13 (from the past 24 hour(s))  URINALYSIS, ROUTINE W REFLEX MICROSCOPIC   Collection Time    06/01/13 10:50 AM      Result Value Ref Range   Color, Urine YELLOW  YELLOW   APPearance CLEAR  CLEAR   Specific Gravity, Urine >1.030 (*) 1.005 - 1.030   pH 6.0  5.0 - 8.0   Glucose, UA NEGATIVE  NEGATIVE mg/dL   Hgb urine dipstick NEGATIVE  NEGATIVE   Bilirubin Urine NEGATIVE  NEGATIVE   Ketones, ur 15 (*) NEGATIVE mg/dL   Protein, ur NEGATIVE  NEGATIVE mg/dL   Urobilinogen, UA 0.2  0.0 - 1.0 mg/dL   Nitrite NEGATIVE  NEGATIVE   Leukocytes, UA NEGATIVE  NEGATIVE  POCT PREGNANCY, URINE   Collection Time    06/01/13 10:59 AM      Result Value Ref Range   Preg Test, Ur POSITIVE (*) NEGATIVE  WET PREP, GENITAL   Collection Time    06/01/13 11:40 AM      Result Value Ref Range   Yeast Wet Prep HPF POC NONE SEEN  NONE SEEN   Trich, Wet Prep NONE SEEN  NONE SEEN   Clue Cells Wet Prep HPF POC MANY (*) NONE SEEN  WBC, Wet Prep HPF POC MODERATE (*) NONE SEEN   Imaging Studies:  No results found.  Assessment: Katrina Garcia is  28 y.o. G1P0 at [redacted]w[redacted]d presents with + pregnancy test, note provided. Vaginal discharge with wet mount showing BV will tx with flagyl 500mg  BID x7d. estbalish prenatal care Allen Norris 5/28/201512:23 PM

## 2013-06-01 NOTE — MAU Note (Signed)
Patient state she has had a positive home pregnancy test. Wants confirmation. States she has had a vaginal discharge with an odor, abdominal cramping for about 1 1/2 weeks. Nausea, no vomiting. Denies bleeding.

## 2013-06-02 LAB — GC/CHLAMYDIA PROBE AMP
CT Probe RNA: NEGATIVE
GC Probe RNA: NEGATIVE

## 2013-06-17 ENCOUNTER — Inpatient Hospital Stay (HOSPITAL_COMMUNITY): Payer: Medicaid Other

## 2013-06-17 ENCOUNTER — Inpatient Hospital Stay (HOSPITAL_COMMUNITY)
Admission: AD | Admit: 2013-06-17 | Discharge: 2013-06-17 | Disposition: A | Discharge status: Home / Self Care | Payer: Medicaid Other | Source: Ambulatory Visit | Attending: Obstetrics and Gynecology | Admitting: Obstetrics and Gynecology

## 2013-06-17 ENCOUNTER — Encounter (HOSPITAL_COMMUNITY): Payer: Self-pay

## 2013-06-17 DIAGNOSIS — N949 Unspecified condition associated with female genital organs and menstrual cycle: Secondary | ICD-10-CM | POA: Diagnosis not present

## 2013-06-17 DIAGNOSIS — O99891 Other specified diseases and conditions complicating pregnancy: Secondary | ICD-10-CM | POA: Diagnosis not present

## 2013-06-17 DIAGNOSIS — F329 Major depressive disorder, single episode, unspecified: Secondary | ICD-10-CM | POA: Insufficient documentation

## 2013-06-17 DIAGNOSIS — D259 Leiomyoma of uterus, unspecified: Secondary | ICD-10-CM

## 2013-06-17 DIAGNOSIS — R1031 Right lower quadrant pain: Secondary | ICD-10-CM | POA: Insufficient documentation

## 2013-06-17 DIAGNOSIS — O9989 Other specified diseases and conditions complicating pregnancy, childbirth and the puerperium: Principal | ICD-10-CM

## 2013-06-17 DIAGNOSIS — R102 Pelvic and perineal pain: Secondary | ICD-10-CM

## 2013-06-17 DIAGNOSIS — O3410 Maternal care for benign tumor of corpus uteri, unspecified trimester: Secondary | ICD-10-CM | POA: Diagnosis not present

## 2013-06-17 DIAGNOSIS — Z87891 Personal history of nicotine dependence: Secondary | ICD-10-CM | POA: Diagnosis not present

## 2013-06-17 DIAGNOSIS — O26899 Other specified pregnancy related conditions, unspecified trimester: Secondary | ICD-10-CM

## 2013-06-17 LAB — URINALYSIS, ROUTINE W REFLEX MICROSCOPIC
BILIRUBIN URINE: NEGATIVE
GLUCOSE, UA: NEGATIVE mg/dL
HGB URINE DIPSTICK: NEGATIVE
Ketones, ur: NEGATIVE mg/dL
Leukocytes, UA: NEGATIVE
Nitrite: NEGATIVE
PH: 6 (ref 5.0–8.0)
Protein, ur: NEGATIVE mg/dL
SPECIFIC GRAVITY, URINE: 1.025 (ref 1.005–1.030)
Urobilinogen, UA: 0.2 mg/dL (ref 0.0–1.0)

## 2013-06-17 NOTE — MAU Provider Note (Signed)
History     CSN: [id number]  Arrival date and time: 06/17/13 0959   None     Chief Complaint  Patient presents with  . Abdominal Pain  . Back Pain   HPI 28 y.o. G1P0 at [redacted]w[redacted]d with new onset of RLQ pain and cramping, no vaginal bleeding. Seen in MAU 2 weeks ago with normal pelvic exam, wet prep, cultures.   Past Medical History  Diagnosis Date  . Fibroid   . HPV (human papilloma virus) infection   . MDD (major depressive disorder)   . PTSD (post-traumatic stress disorder)     Past Surgical History  Procedure Laterality Date  . Wisdom tooth extraction      No family history on file.  History  Substance Use Topics  . Smoking status: Former Smoker    Types: Cigarettes    Quit date: 09/06/2011  . Smokeless tobacco: Not on file  . Alcohol Use: No    Allergies: No Known Allergies  Prescriptions prior to admission  Medication Sig Dispense Refill  . metroNIDAZOLE (FLAGYL) 500 MG tablet Take 1 tablet (500 mg total) by mouth 2 (two) times daily.  14 tablet  0  . Prenatal Multivit-Min-Fe-FA (PRENATAL VITAMINS) 0.8 MG tablet Take 1 tablet by mouth daily.  90 tablet  3    Review of Systems  Constitutional: Negative.   Respiratory: Negative.   Cardiovascular: Negative.   Gastrointestinal: Positive for abdominal pain (RLQ/low abd). Negative for nausea, vomiting, diarrhea and constipation.  Genitourinary: Negative for dysuria, urgency, frequency, hematuria and flank pain.       Negative for vaginal bleeding, vaginal discharge, dyspareunia  Musculoskeletal: Negative.   Neurological: Negative.   Psychiatric/Behavioral: Negative.    Physical Exam   Height 5\' 1"  (1.549 m), weight 184 lb 9.6 oz (83.734 kg), last menstrual period 04/19/2013.  Physical Exam  Nursing note and vitals reviewed. Constitutional: She is oriented to person, place, and time. She appears well-developed and well-nourished. No distress.  Cardiovascular: Normal rate.   Respiratory: Effort normal.   GI: Soft. Distention: mild LLQ. There is tenderness.  Neurological: She is alert and oriented to person, place, and time.  Skin: Skin is warm and dry.  Psychiatric: She has a normal mood and affect.    MAU Course  Procedures  Results for orders placed during the hospital encounter of 06/17/13 (from the past 24 hour(s))  URINALYSIS, ROUTINE W REFLEX MICROSCOPIC     Status: None   Collection Time    06/17/13 10:05 AM      Result Value Ref Range   Color, Urine YELLOW  YELLOW   APPearance CLEAR  CLEAR   Specific Gravity, Urine 1.025  1.005 - 1.030   pH 6.0  5.0 - 8.0   Glucose, UA NEGATIVE  NEGATIVE mg/dL   Hgb urine dipstick NEGATIVE  NEGATIVE   Bilirubin Urine NEGATIVE  NEGATIVE   Ketones, ur NEGATIVE  NEGATIVE mg/dL   Protein, ur NEGATIVE  NEGATIVE mg/dL   Urobilinogen, UA 0.2  0.0 - 1.0 mg/dL   Nitrite NEGATIVE  NEGATIVE   Leukocytes, UA NEGATIVE  NEGATIVE   US Ob Comp Less 14 Wks  06/17/2013   CLINICAL DATA:  The right lower quadrant pain in pregnancy  EXAM: OBSTETRIC <14 WK [place] AND TRANSVAGINAL OB US  TECHNIQUE: Both transabdominal and transvaginal ultrasound examinations were performed for complete evaluation of the gestation as well as the maternal uterus, adnexal regions, and pelvic cul-de-sac. Transvaginal technique was performed to assess early pregnancy.  COMPARISON:  None.  FINDINGS: Intrauterine gestational sac: Visualized/normal in shape. No subchorionic hematoma.  Yolk sac:  Present  Embryo:  Present  Cardiac Activity: Present.  Heart Rate:  160  bpm  CRL:   2.1  mm   8 w 5d                  [place] EDC: 01/22/2014  Maternal uterus/adnexae: 2 hypoechoic sub serosal uterine masses are noted, 3.1 cm in the left body and 2 cm in the posterior fundic region. The ovaries are symmetric and normal in appearance. No adnexal mass. No free pelvic fluid.  IMPRESSION: 1. Single, living intrauterine gestation- sonographic age 61 weeks 5 days. 2. Two small subserosal uterine fibroids. 3.  Negative ovaries.   Electronically Signed   By: Jorje Guild M.D.   On: 06/17/2013 12:00   US Ob Transvaginal  06/17/2013   CLINICAL DATA:  The right lower quadrant pain in pregnancy  EXAM: OBSTETRIC <14 WK [place] AND TRANSVAGINAL OB US  TECHNIQUE: Both transabdominal and transvaginal ultrasound examinations were performed for complete evaluation of the gestation as well as the maternal uterus, adnexal regions, and pelvic cul-de-sac. Transvaginal technique was performed to assess early pregnancy.  COMPARISON:  None.  FINDINGS: Intrauterine gestational sac: Visualized/normal in shape. No subchorionic hematoma.  Yolk sac:  Present  Embryo:  Present  Cardiac Activity: Present.  Heart Rate:  160  bpm  CRL:   2.1  mm   8 w 5d                  [place] EDC: 01/22/2014  Maternal uterus/adnexae: 2 hypoechoic sub serosal uterine masses are noted, 3.1 cm in the left body and 2 cm in the posterior fundic region. The ovaries are symmetric and normal in appearance. No adnexal mass. No free pelvic fluid.  IMPRESSION: 1. Single, living intrauterine gestation- sonographic age 9 weeks 5 days. 2. Two small subserosal uterine fibroids. 3. Negative ovaries.   Electronically Signed   By: Jorje Guild M.D.   On: 06/17/2013 12:00    Assessment and Plan   1. Pelvic pain in pregnant patient at less than [redacted] weeks gestation   2. Uterine fibroids affecting pregnancy       Medication List         Prenatal Vitamins 0.8 MG tablet  Take 1 tablet by mouth daily.            Follow-up Information   Follow up with prenatal care provider of your choice. (as scheduled)        Rajat Staver 06/17/2013, 10:21 AM

## 2013-06-17 NOTE — MAU Provider Note (Signed)
Attestation of Attending Supervision of Advanced Practitioner: Evaluation and management procedures were performed by the PA/NP/CNM/OB Fellow under my supervision/collaboration. Chart reviewed and agree with management and plan.  Afton Mikelson V 06/17/2013 6:10 PM

## 2013-06-17 NOTE — MAU Note (Signed)
Pt C/O RLQ pain that started last night, also radiates into her back.  Denies bleeding.

## 2013-06-17 NOTE — MAU Note (Signed)
Pt states rlq pain that "sits there". Pain worse intermittently. Denies bleeding or vag d/c changes.

## 2013-07-19 ENCOUNTER — Encounter: Payer: Self-pay | Admitting: Advanced Practice Midwife

## 2013-07-19 ENCOUNTER — Other Ambulatory Visit (HOSPITAL_COMMUNITY): Payer: Self-pay

## 2013-07-19 ENCOUNTER — Other Ambulatory Visit (HOSPITAL_COMMUNITY)
Admission: RE | Admit: 2013-07-19 | Discharge: 2013-07-19 | Disposition: A | Discharge status: Home / Self Care | Payer: Medicaid Other | Source: Ambulatory Visit | Attending: Advanced Practice Midwife | Admitting: Advanced Practice Midwife

## 2013-07-19 ENCOUNTER — Ambulatory Visit (INDEPENDENT_AMBULATORY_CARE_PROVIDER_SITE_OTHER): Payer: Medicaid Other | Admitting: Advanced Practice Midwife

## 2013-07-19 VITALS — BP 101/64 | HR 90 | Temp 98.0°F | Wt 184.0 lb

## 2013-07-19 DIAGNOSIS — Z3480 Encounter for supervision of other normal pregnancy, unspecified trimester: Secondary | ICD-10-CM

## 2013-07-19 DIAGNOSIS — Z3490 Encounter for supervision of normal pregnancy, unspecified, unspecified trimester: Secondary | ICD-10-CM | POA: Insufficient documentation

## 2013-07-19 DIAGNOSIS — Z113 Encounter for screening for infections with a predominantly sexual mode of transmission: Secondary | ICD-10-CM | POA: Diagnosis present

## 2013-07-19 DIAGNOSIS — Z3492 Encounter for supervision of normal pregnancy, unspecified, second trimester: Secondary | ICD-10-CM

## 2013-07-19 DIAGNOSIS — Z01419 Encounter for gynecological examination (general) (routine) without abnormal findings: Secondary | ICD-10-CM | POA: Insufficient documentation

## 2013-07-19 DIAGNOSIS — Z3491 Encounter for supervision of normal pregnancy, unspecified, first trimester: Secondary | ICD-10-CM

## 2013-07-19 DIAGNOSIS — O99340 Other mental disorders complicating pregnancy, unspecified trimester: Secondary | ICD-10-CM

## 2013-07-19 LAB — POCT URINALYSIS DIP (DEVICE)
Bilirubin Urine: NEGATIVE
Glucose, UA: NEGATIVE mg/dL
HGB URINE DIPSTICK: NEGATIVE
Ketones, ur: 15 mg/dL — AB
Leukocytes, UA: NEGATIVE
Nitrite: NEGATIVE
Protein, ur: 30 mg/dL — AB
Specific Gravity, Urine: 1.025 (ref 1.005–1.030)
UROBILINOGEN UA: 0.2 mg/dL (ref 0.0–1.0)
pH: 6 (ref 5.0–8.0)

## 2013-07-19 NOTE — Progress Notes (Signed)
   Subjective:    Katrina Garcia is a G1P0 [redacted]w[redacted]d being seen today for her first obstetrical visit.  Her obstetrical history is significant for obesity. Hx OD w/ Tylenol. Not free to discuss w/ partner present.  Patient does intend to breast feed. Pregnancy history fully reviewed.  Patient reports no complaints. Not under psychiatric care. Reports normal mood.   Filed Vitals:   07/19/13 0914  BP: 101/64  Pulse: 90  Temp: 98 F (36.7 C)  Weight: 184 lb (83.462 kg)    HISTORY: OB History  Gravida Para Term Preterm AB SAB TAB Ectopic Multiple Living  1             # Outcome Date GA Lbr Len/2nd Weight Sex Delivery Anes PTL Lv  1 CUR              Past Medical History  Diagnosis Date  . Fibroid   . HPV (human papilloma virus) infection   . MDD (major depressive disorder)   . PTSD (post-traumatic stress disorder)    Past Surgical History  Procedure Laterality Date  . Wisdom tooth extraction     History reviewed. No pertinent family history.   Exam    Uterus:     Pelvic Exam:    Perineum: No Hemorrhoids, Normal Perineum   Vulva: normal   Vagina:  normal mucosa, normal discharge   pH: NA   Cervix: no bleeding following Pap, no lesions and nulliparous appearance   Adnexa: normal adnexa   Bony Pelvis: average  System: Breast:  normal appearance, no masses or tenderness   Skin: normal coloration and turgor, no rashes    Neurologic: oriented, normal, normal mood   Extremities: normal strength, tone, and muscle mass   HEENT sclera clear, anicteric and thyroid without masses   Mouth/Teeth mucous membranes moist, pharynx normal without lesions and dental hygiene good   Neck supple   Cardiovascular: regular rate and rhythm, no murmurs or gallops   Respiratory:  appears well, vitals normal, no respiratory distress, acyanotic, normal RR, chest clear, no wheezing, crepitations, rhonchi, normal symmetric air entry   Abdomen: soft, non-tender; bowel sounds normal; no masses,  no  organomegaly and Uterus 1/SP   Urinary: urethral meatus normal      Assessment:    Pregnancy: G1P0 Patient Active Problem List   Diagnosis Date Noted  . Supervision of normal pregnancy 07/19/2013  . MDD (major depressive disorder) 08/12/2012  . PTSD (post-traumatic stress disorder) 08/12/2012        Plan:     Initial labs and early 1 hour GTT drawn. Prenatal vitamins. Problem list reviewed and updated. Genetic Screening discussed First Screen: ordered.  Ultrasound discussed; fetal survey: requested.  Follow up in 4 weeks. 75% of 30 min visit spent on counseling and coordination of care.  Pap  Need to discuss psych Hx in detail when pt alone.   Manya Silvas 07/19/2013

## 2013-07-19 NOTE — Patient Instructions (Signed)
Pregnancy - First Trimester During sexual intercourse, millions of sperm go into the vagina. Only 1 sperm will penetrate and fertilize the female egg while it is in the Fallopian tube. One week later, the fertilized egg implants into the wall of the uterus. An embryo begins to develop into a baby. At 6 to 8 weeks, the eyes and face are formed and the heartbeat can be seen on ultrasound. At the end of 12 weeks (first trimester), all the baby's organs are formed. Now that you are pregnant, you will want to do everything you can to have a healthy baby. Two of the most important things are to get good prenatal care and follow your caregiver's instructions. Prenatal care is all the medical care you receive before the baby's birth. It is given to prevent, find, and treat problems during the pregnancy and childbirth. PRENATAL EXAMS  During prenatal visits, your weight, blood pressure, and urine are checked. This is done to make sure you are healthy and progressing normally during the pregnancy.  A pregnant woman should gain 25 to 35 pounds during the pregnancy. However, if you are overweight or underweight, your caregiver will advise you regarding your weight.  Your caregiver will ask and answer questions for you.  Blood work, cervical cultures, other necessary tests, and a Pap test are done during your prenatal exams. These tests are done to check on your health and the probable health of your baby. Tests are strongly recommended and done for HIV with your permission. This is the virus that causes AIDS. These tests are done because medicines can be given to help prevent your baby from being born with this infection should you have been infected without knowing it. Blood work is also used to find out your blood type, previous infections, and follow your blood levels (hemoglobin).  Low hemoglobin (anemia) is common during pregnancy. Iron and vitamins are given to help prevent this. Later in the pregnancy,  blood tests for diabetes will be done along with any other tests if any problems develop.  You may need other tests to make sure you and the baby are doing well. CHANGES DURING THE FIRST TRIMESTER  Your body goes through many changes during pregnancy. They vary from person to person. Talk to your caregiver about changes you notice and are concerned about. Changes can include:  Your menstrual period stops.  The egg and sperm carry the genes that determine what you look like. Genes from you and your partner are forming a baby. The female genes determine whether the baby is a boy or a girl.  Your body increases in girth and you may feel bloated.  Feeling sick to your stomach (nauseous) and throwing up (vomiting). If the vomiting is uncontrollable, call your caregiver.  Your breasts will begin to enlarge and become tender.  Your nipples may stick out more and become darker.  The need to urinate more. Painful urination may mean you have a bladder infection.  Tiring easily.  Loss of appetite.  Cravings for certain kinds of food.  At first, you may gain or lose a couple of pounds.  You may have changes in your emotions from day to day (excited to be pregnant or concerned something may go wrong with the pregnancy and baby).  You may have more vivid and strange dreams. HOME CARE INSTRUCTIONS   It is very important to avoid all smoking, alcohol and non-prescribed drugs during your pregnancy. These affect the formation and growth of the baby.  Avoid chemicals while pregnant to ensure the delivery of a healthy infant.  Start your prenatal visits by the 12th week of pregnancy. They are usually scheduled monthly at first, then more often in the last 2 months before delivery. Keep your caregiver's appointments. Follow your caregiver's instructions regarding medicine use, blood and lab tests, exercise, and diet.  During pregnancy, you are providing food for you and your baby. Eat regular,  well-balanced meals. Choose foods such as meat, fish, milk and other low fat dairy products, vegetables, fruits, and whole-grain breads and cereals. Your caregiver will tell you of the ideal weight gain.  You can help morning sickness by keeping soda crackers at the bedside. Eat a couple before arising in the morning. You may want to use the crackers without salt on them.  Eating 4 to 5 small meals rather than 3 large meals a day also may help the nausea and vomiting.  Drinking liquids between meals instead of during meals also seems to help nausea and vomiting.  A physical sexual relationship may be continued throughout pregnancy if there are no other problems. Problems may be early (premature) leaking of amniotic fluid from the membranes, vaginal bleeding, or belly (abdominal) pain.  Exercise regularly if there are no restrictions. Check with your caregiver or physical therapist if you are unsure of the safety of some of your exercises. Greater weight gain will occur in the last 2 trimesters of pregnancy. Exercising will help:  Control your weight.  Keep you in shape.  Prepare you for labor and delivery.  Help you lose your pregnancy weight after you deliver your baby.  Wear a good support or jogging bra for breast tenderness during pregnancy. This may help if worn during sleep too.  Ask when prenatal classes are available. Begin classes when they are offered.  Do not use hot tubs, steam rooms, or saunas.  Wear your seat belt when driving. This protects you and your baby if you are in an accident.  Avoid raw meat, uncooked cheese, cat litter boxes, and soil used by cats throughout the pregnancy. These carry germs that can cause birth defects in the baby.  The first trimester is a good time to visit your dentist for your dental health. Getting your teeth cleaned is okay. Use a softer toothbrush and brush gently during pregnancy.  Ask for help if you have financial, counseling, or  nutritional needs during pregnancy. Your caregiver will be able to offer counseling for these needs as well as refer you for other special needs.  Do not take any medicines or herbs unless told by your caregiver.  Inform your caregiver if there is any mental or physical domestic violence.  Make a list of emergency phone numbers of family, friends, hospital, and police and fire departments.  Write down your questions. Take them to your prenatal visit.  Do not douche.  Do not cross your legs.  If you have to stand for long periods of time, rotate you feet or take small steps in a circle.  You may have more vaginal secretions that may require a sanitary pad. Do not use tampons or scented sanitary pads. MEDICINES AND DRUG USE IN PREGNANCY  Take prenatal vitamins as directed. The vitamin should contain 1 milligram of folic acid. Keep all vitamins out of reach of children. Only a couple vitamins or tablets containing iron may be fatal to a baby or young child when ingested.  Avoid use of all medicines, including herbs, over-the-counter medicines, not  prescribed or suggested by your caregiver. Only take over-the-counter or prescription medicines for pain, discomfort, or fever as directed by your caregiver. Do not use aspirin, ibuprofen, or naproxen unless directed by your caregiver.  Let your caregiver also know about herbs you may be using.  Alcohol is related to a number of birth defects. This includes fetal alcohol syndrome. All alcohol, in any form, should be avoided completely. Smoking will cause low birth rate and premature babies.  Street or illegal drugs are very harmful to the baby. They are absolutely forbidden. A baby born to an addicted mother will be addicted at birth. The baby will go through the same withdrawal an adult does.  Let your caregiver know about any medicines that you have to take and for what reason you take them. SEEK MEDICAL CARE IF:  You have any concerns or  worries during your pregnancy. It is better to call with your questions if you feel they cannot wait, rather than worry about them. SEEK IMMEDIATE MEDICAL CARE IF:   An unexplained oral temperature above 102 F (38.9 C) develops, or as your caregiver suggests.  You have leaking of fluid from the vagina (birth canal). If leaking membranes are suspected, take your temperature and inform your caregiver of this when you call.  There is vaginal spotting or bleeding. Notify your caregiver of the amount and how many pads are used.  You develop a bad smelling vaginal discharge with a change in the color.  You continue to feel sick to your stomach (nauseated) and have no relief from remedies suggested. You vomit blood or coffee ground-like materials.  You lose more than 2 pounds of weight in 1 week.  You gain more than 2 pounds of weight in 1 week and you notice swelling of your face, hands, feet, or legs.  You gain 5 pounds or more in 1 week (even if you do not have swelling of your hands, face, legs, or feet).  You get exposed to [place] measles and have never had them.  You are exposed to fifth disease or chickenpox.  You develop belly (abdominal) pain. Round ligament discomfort is a common non-cancerous (benign) cause of abdominal pain in pregnancy. Your caregiver still must evaluate this.  You develop headache, fever, diarrhea, pain with urination, or shortness of breath.  You fall or are in a car accident or have any kind of trauma.  There is mental or physical violence in your home. Document Released: 12/16/2000 Document Revised: 09/16/2011 Document Reviewed: 11/01/2012 [facility] Patient Information 2015 [place], [place]. This information is not intended to replace advice given to you by your health care provider. Make sure you discuss any questions you have with your health care provider.  Breastfeeding Deciding to breastfeed is one of the best choices you can make for you and your  baby. A change in hormones during pregnancy causes your breast tissue to grow and increases the number and size of your milk ducts. These hormones also allow proteins, sugars, and fats from your blood supply to make breast milk in your milk-producing glands. Hormones prevent breast milk from being released before your baby is born as well as prompt milk flow after birth. Once breastfeeding has begun, thoughts of your baby, as well as his or her sucking or crying, can stimulate the release of milk from your milk-producing glands.  BENEFITS OF BREASTFEEDING For Your Baby  Your first milk (colostrum) helps your baby's digestive system function better.   There are antibodies in  your milk that help your baby fight off infections.   Your baby has a lower incidence of asthma, allergies, and sudden infant death syndrome.   The nutrients in breast milk are better for your baby than infant formulas and are designed uniquely for your baby's needs.   Breast milk improves your baby's brain development.   Your baby is less likely to develop other conditions, such as childhood obesity, asthma, or type 2 diabetes mellitus.  For You   Breastfeeding helps to create a very special bond between you and your baby.   Breastfeeding is convenient. Breast milk is always available at the correct temperature and costs nothing.   Breastfeeding helps to burn calories and helps you lose the weight gained during pregnancy.   Breastfeeding makes your uterus contract to its prepregnancy size faster and slows bleeding (lochia) after you give birth.   Breastfeeding helps to lower your risk of developing type 2 diabetes mellitus, osteoporosis, and breast or ovarian cancer later in life. SIGNS THAT YOUR BABY IS HUNGRY Early Signs of Hunger  Increased alertness or activity.  Stretching.  Movement of the head from side to side.  Movement of the head and opening of the mouth when the corner of the mouth or  cheek is stroked (rooting).  Increased sucking sounds, smacking lips, cooing, sighing, or squeaking.  Hand-to-mouth movements.  Increased sucking of fingers or hands. Late Signs of Hunger  Fussing.  Intermittent crying. Extreme Signs of Hunger Signs of extreme hunger will require calming and consoling before your baby will be able to breastfeed successfully. Do not wait for the following signs of extreme hunger to occur before you initiate breastfeeding:   Restlessness.  A loud, strong cry.   Screaming. BREASTFEEDING BASICS Breastfeeding Initiation  Find a comfortable place to sit or lie down, with your neck and back well supported.  Place a pillow or rolled up blanket under your baby to bring him or her to the level of your breast (if you are seated). Nursing pillows are specially designed to help support your arms and your baby while you breastfeed.  Make sure that your baby's abdomen is facing your abdomen.   Gently massage your breast. With your fingertips, massage from your chest wall toward your nipple in a circular motion. This encourages milk flow. You may need to continue this action during the feeding if your milk flows slowly.  Support your breast with 4 fingers underneath and your thumb above your nipple. Make sure your fingers are well away from your nipple and your baby's mouth.   Stroke your baby's lips gently with your finger or nipple.   When your baby's mouth is open wide enough, quickly bring your baby to your breast, placing your entire nipple and as much of the colored area around your nipple (areola) as possible into your baby's mouth.   More areola should be visible above your baby's upper lip than below the lower lip.   Your baby's tongue should be between his or her lower gum and your breast.   Ensure that your baby's mouth is correctly positioned around your nipple (latched). Your baby's lips should create a seal on your breast and be turned  out (everted).  It is common for your baby to suck about 2-3 minutes in order to start the flow of breast milk. Latching Teaching your baby how to latch on to your breast properly is very important. An improper latch can cause nipple pain and decreased milk supply  for you and poor weight gain in your baby. Also, if your baby is not latched onto your nipple properly, he or she may swallow some air during feeding. This can make your baby fussy. Burping your baby when you switch breasts during the feeding can help to get rid of the air. However, teaching your baby to latch on properly is still the best way to prevent fussiness from swallowing air while breastfeeding. Signs that your baby has successfully latched on to your nipple:    Silent tugging or silent sucking, without causing you pain.   Swallowing heard between every 3-4 sucks.    Muscle movement above and in front of his or her ears while sucking.  Signs that your baby has not successfully latched on to nipple:   Sucking sounds or smacking sounds from your baby while breastfeeding.  Nipple pain. If you think your baby has not latched on correctly, slip your finger into the corner of your baby's mouth to break the suction and place it between your baby's gums. Attempt breastfeeding initiation again. Signs of Successful Breastfeeding Signs from your baby:   A gradual decrease in the number of sucks or complete cessation of sucking.   Falling asleep.   Relaxation of his or her body.   Retention of a small amount of milk in his or her mouth.   Letting go of your breast by himself or herself. Signs from you:  Breasts that have increased in firmness, weight, and size 1-3 hours after feeding.   Breasts that are softer immediately after breastfeeding.  Increased milk volume, as well as a change in milk consistency and color by the fifth day of breastfeeding.   Nipples that are not sore, cracked, or bleeding. Signs That  Your [employer] is Getting Enough Milk  Wetting at least 3 diapers in a 24-hour period. The urine should be clear and pale yellow by age 486 days.  At least 3 stools in a 24-hour period by age 486 days. The stool should be soft and yellow.  At least 3 stools in a 24-hour period by age 16 days. The stool should be seedy and yellow.  No loss of weight greater than 10% of birth weight during the first 25 days of age.  Average weight gain of 4-7 ounces (113-198 g) per week after age 48 days.  Consistent daily weight gain by age 68 days, without weight loss after the age of 2 weeks. After a feeding, your baby may spit up a small amount. This is common. BREASTFEEDING FREQUENCY AND DURATION Frequent feeding will help you make more milk and can prevent sore nipples and breast engorgement. Breastfeed when you feel the need to reduce the fullness of your breasts or when your baby shows signs of hunger. This is called "breastfeeding on demand." Avoid introducing a pacifier to your baby while you are working to establish breastfeeding (the first 4-6 weeks after your baby is born). After this time you may choose to use a pacifier. Research has shown that pacifier use during the first year of a baby's life decreases the risk of sudden infant death syndrome (SIDS). Allow your baby to feed on each breast as long as he or she wants. Breastfeed until your baby is finished feeding. When your baby unlatches or falls asleep while feeding from the first breast, offer the second breast. Because newborns are often sleepy in the first few weeks of life, you may need to awaken your baby to get him or  her to feed. Breastfeeding times will vary from baby to baby. However, the following rules can serve as a guide to help you ensure that your baby is properly fed:  Newborns (babies 12 weeks of age or younger) may breastfeed every 1-3 hours.  Newborns should not go longer than 3 hours during the day or 5 hours during the night without  breastfeeding.  You should breastfeed your baby a minimum of 8 times in a 24-hour period until you begin to introduce solid foods to your baby at around 15 months of age. BREAST MILK PUMPING Pumping and storing breast milk allows you to ensure that your baby is exclusively fed your breast milk, even at times when you are unable to breastfeed. This is especially important if you are going back to work while you are still breastfeeding or when you are not able to be present during feedings. Your lactation consultant can give you guidelines on how long it is safe to store breast milk.  A breast pump is a machine that allows you to pump milk from your breast into a sterile bottle. The pumped breast milk can then be stored in a refrigerator or freezer. Some breast pumps are operated by hand, while others use electricity. Ask your lactation consultant which type will work best for you. Breast pumps can be purchased, but some hospitals and breastfeeding support groups lease breast pumps on a monthly basis. A lactation consultant can teach you how to hand express breast milk, if you prefer not to use a pump.  CARING FOR YOUR BREASTS WHILE YOU BREASTFEED Nipples can become dry, cracked, and sore while breastfeeding. The following recommendations can help keep your breasts moisturized and healthy:  Avoid using soap on your nipples.   Wear a supportive bra. Although not required, special nursing bras and tank tops are designed to allow access to your breasts for breastfeeding without taking off your entire bra or top. Avoid wearing underwire-style bras or extremely tight bras.  Air dry your nipples for 3-62minutes after each feeding.   Use only cotton bra pads to absorb leaked breast milk. Leaking of breast milk between feedings is normal.   Use lanolin on your nipples after breastfeeding. Lanolin helps to maintain your skin's normal moisture barrier. If you use pure lanolin, you do not need to wash it off  before feeding your baby again. Pure lanolin is not toxic to your baby. You may also hand express a few drops of breast milk and gently massage that milk into your nipples and allow the milk to air dry. In the first few weeks after giving birth, some women experience extremely full breasts (engorgement). Engorgement can make your breasts feel heavy, warm, and tender to the touch. Engorgement peaks within 3-5 days after you give birth. The following recommendations can help ease engorgement:  Completely empty your breasts while breastfeeding or pumping. You may want to start by applying warm, moist heat (in the shower or with warm water-soaked hand towels) just before feeding or pumping. This increases circulation and helps the milk flow. If your baby does not completely empty your breasts while breastfeeding, pump any extra milk after he or she is finished.  Wear a snug bra (nursing or regular) or tank top for 1-2 days to signal your body to slightly decrease milk production.  Apply ice packs to your breasts, unless this is too uncomfortable for you.  Make sure that your baby is latched on and positioned properly while breastfeeding. If engorgement  persists after 48 hours of following these recommendations, contact your health care provider or a Science writer. OVERALL HEALTH CARE RECOMMENDATIONS WHILE BREASTFEEDING  Eat healthy foods. Alternate between meals and snacks, eating 3 of each per day. Because what you eat affects your breast milk, some of the foods may make your baby more irritable than usual. Avoid eating these foods if you are sure that they are negatively affecting your baby.  Drink milk, fruit juice, and water to satisfy your thirst (about 10 glasses a day).   Rest often, relax, and continue to take your prenatal vitamins to prevent fatigue, stress, and anemia.  Continue breast self-awareness checks.  Avoid chewing and smoking tobacco.  Avoid alcohol and drug use. Some  medicines that may be harmful to your baby can pass through breast milk. It is important to ask your health care provider before taking any medicine, including all over-the-counter and prescription medicine as well as vitamin and herbal supplements. It is possible to become pregnant while breastfeeding. If birth control is desired, ask your health care provider about options that will be safe for your baby. SEEK MEDICAL CARE IF:   You feel like you want to stop breastfeeding or have become frustrated with breastfeeding.  You have painful breasts or nipples.  Your nipples are cracked or bleeding.  Your breasts are red, tender, or warm.  You have a swollen area on either breast.  You have a fever or chills.  You have nausea or vomiting.  You have drainage other than breast milk from your nipples.  Your breasts do not become full before feedings by the fifth day after you give birth.  You feel sad and depressed.  Your baby is too sleepy to eat well.  Your baby is having trouble sleeping.   Your baby is wetting less than 3 diapers in a 24-hour period.  Your baby has less than 3 stools in a 24-hour period.  Your baby's skin or the white part of his or her eyes becomes yellow.   Your baby is not gaining weight by 82 days of age. SEEK IMMEDIATE MEDICAL CARE IF:   Your baby is overly tired (lethargic) and does not want to wake up and feed.  Your baby develops an unexplained fever. Document Released: 12/22/2004 Document Revised: 12/27/2012 Document Reviewed: 06/15/2012 [facility] Patient Information 2015 [place], [place]. This information is not intended to replace advice given to you by your health care provider. Make sure you discuss any questions you have with your health care provider.

## 2013-07-19 NOTE — Progress Notes (Signed)
Initial prenatal visit, education material given.  Partner is unaware of HPV, please do not discuss in front of FOB

## 2013-07-20 ENCOUNTER — Encounter: Payer: Self-pay | Admitting: Advanced Practice Midwife

## 2013-07-20 LAB — OBSTETRIC PANEL
Antibody Screen: NEGATIVE
Basophils Absolute: 0 10*3/uL (ref 0.0–0.1)
Basophils Relative: 0 % (ref 0–1)
EOS ABS: 0.1 10*3/uL (ref 0.0–0.7)
Eosinophils Relative: 1 % (ref 0–5)
HEMATOCRIT: 33.6 % — AB (ref 36.0–46.0)
HEMOGLOBIN: 11.3 g/dL — AB (ref 12.0–15.0)
HEP B S AG: NEGATIVE
LYMPHS ABS: 1.7 10*3/uL (ref 0.7–4.0)
Lymphocytes Relative: 18 % (ref 12–46)
MCH: 26.1 pg (ref 26.0–34.0)
MCHC: 33.6 g/dL (ref 30.0–36.0)
MCV: 77.6 fL — ABNORMAL LOW (ref 78.0–100.0)
MONOS PCT: 7 % (ref 3–12)
Monocytes Absolute: 0.7 10*3/uL (ref 0.1–1.0)
NEUTROS PCT: 74 % (ref 43–77)
Neutro Abs: 7.1 10*3/uL (ref 1.7–7.7)
Platelets: 288 10*3/uL (ref 150–400)
RBC: 4.33 MIL/uL (ref 3.87–5.11)
RDW: 15.7 % — ABNORMAL HIGH (ref 11.5–15.5)
Rh Type: POSITIVE
Rubella: 1.15 Index — ABNORMAL HIGH (ref <0.90)
WBC: 9.6 10*3/uL (ref 4.0–10.5)

## 2013-07-20 LAB — GLUCOSE TOLERANCE, 1 HOUR (50G) W/O FASTING: Glucose, 1 Hour GTT: 125 mg/dL (ref 70–140)

## 2013-07-20 LAB — CYTOLOGY - PAP

## 2013-07-20 LAB — HIV ANTIBODY (ROUTINE TESTING W REFLEX): HIV 1&2 Ab, 4th Generation: NONREACTIVE

## 2013-07-21 ENCOUNTER — Other Ambulatory Visit (HOSPITAL_COMMUNITY): Payer: Self-pay | Admitting: Obstetrics and Gynecology

## 2013-07-21 ENCOUNTER — Ambulatory Visit (HOSPITAL_COMMUNITY)
Admission: RE | Admit: 2013-07-21 | Discharge: 2013-07-21 | Disposition: A | Discharge status: Home / Self Care | Payer: Medicaid Other | Source: Ambulatory Visit | Attending: Advanced Practice Midwife | Admitting: Advanced Practice Midwife

## 2013-07-21 ENCOUNTER — Other Ambulatory Visit: Payer: Self-pay

## 2013-07-21 DIAGNOSIS — Z3491 Encounter for supervision of normal pregnancy, unspecified, first trimester: Secondary | ICD-10-CM

## 2013-07-21 DIAGNOSIS — Z0489 Encounter for examination and observation for other specified reasons: Secondary | ICD-10-CM

## 2013-07-21 DIAGNOSIS — Z3689 Encounter for other specified antenatal screening: Secondary | ICD-10-CM | POA: Insufficient documentation

## 2013-07-21 DIAGNOSIS — IMO0002 Reserved for concepts with insufficient information to code with codable children: Secondary | ICD-10-CM

## 2013-07-21 LAB — PRESCRIPTION MONITORING PROFILE (19 PANEL)
Amphetamine/Meth: NEGATIVE ng/mL
BARBITURATE SCREEN, URINE: NEGATIVE ng/mL
BUPRENORPHINE, URINE: NEGATIVE ng/mL
Benzodiazepine Screen, Urine: NEGATIVE ng/mL
COCAINE METABOLITES: NEGATIVE ng/mL
Cannabinoid Scrn, Ur: NEGATIVE ng/mL
Carisoprodol, Urine: NEGATIVE ng/mL
Creatinine, Urine: 384.08 mg/dL (ref >20.0)
FENTANYL URINE: NEGATIVE ng/mL
MDMA URINE: NEGATIVE ng/mL
Meperidine, Ur: NEGATIVE ng/mL
Methadone Screen, Urine: NEGATIVE ng/mL
Methaqualone: NEGATIVE ng/mL
Nitrites, Initial: NEGATIVE ug/mL
Opiate Screen, Urine: NEGATIVE ng/mL
Oxycodone Screen, Ur: NEGATIVE ng/mL
Phencyclidine, Ur: NEGATIVE ng/mL
Propoxyphene: NEGATIVE ng/mL
Tapentadol, urine: NEGATIVE ng/mL
Tramadol Scrn, Ur: NEGATIVE ng/mL
Zolpidem, Urine: NEGATIVE ng/mL
pH, Initial: 6.3 pH (ref 4.5–8.9)

## 2013-07-21 LAB — CULTURE, OB URINE: Colony Count: >=100000

## 2013-07-21 LAB — HEMOGLOBINOPATHY EVALUATION
HGB A2 QUANT: 2.8 % (ref 2.2–3.2)
Hemoglobin Other: 0 %
Hgb A: 97.2 % (ref 96.8–97.8)
Hgb F Quant: 0 % (ref 0.0–2.0)
Hgb S Quant: 0 %

## 2013-07-29 ENCOUNTER — Encounter: Payer: Self-pay | Admitting: Advanced Practice Midwife

## 2013-07-29 DIAGNOSIS — D259 Leiomyoma of uterus, unspecified: Secondary | ICD-10-CM

## 2013-07-29 HISTORY — DX: Leiomyoma of uterus, unspecified: D25.9

## 2013-07-31 ENCOUNTER — Encounter (HOSPITAL_COMMUNITY): Payer: Self-pay | Admitting: *Deleted

## 2013-07-31 ENCOUNTER — Telehealth: Payer: Self-pay | Admitting: General Practice

## 2013-07-31 ENCOUNTER — Inpatient Hospital Stay (HOSPITAL_COMMUNITY)
Admission: AD | Admit: 2013-07-31 | Discharge: 2013-07-31 | Disposition: A | Discharge status: Home / Self Care | Payer: Medicaid Other | Source: Ambulatory Visit | Attending: Obstetrics & Gynecology | Admitting: Obstetrics & Gynecology

## 2013-07-31 DIAGNOSIS — D251 Intramural leiomyoma of uterus: Secondary | ICD-10-CM | POA: Diagnosis not present

## 2013-07-31 DIAGNOSIS — O9989 Other specified diseases and conditions complicating pregnancy, childbirth and the puerperium: Secondary | ICD-10-CM

## 2013-07-31 DIAGNOSIS — O99891 Other specified diseases and conditions complicating pregnancy: Secondary | ICD-10-CM | POA: Diagnosis not present

## 2013-07-31 DIAGNOSIS — Z87891 Personal history of nicotine dependence: Secondary | ICD-10-CM | POA: Insufficient documentation

## 2013-07-31 DIAGNOSIS — O3410 Maternal care for benign tumor of corpus uteri, unspecified trimester: Secondary | ICD-10-CM | POA: Insufficient documentation

## 2013-07-31 DIAGNOSIS — R109 Unspecified abdominal pain: Secondary | ICD-10-CM

## 2013-07-31 DIAGNOSIS — O26899 Other specified pregnancy related conditions, unspecified trimester: Secondary | ICD-10-CM

## 2013-07-31 DIAGNOSIS — D252 Subserosal leiomyoma of uterus: Secondary | ICD-10-CM | POA: Diagnosis not present

## 2013-07-31 DIAGNOSIS — O98519 Other viral diseases complicating pregnancy, unspecified trimester: Secondary | ICD-10-CM | POA: Insufficient documentation

## 2013-07-31 DIAGNOSIS — B977 Papillomavirus as the cause of diseases classified elsewhere: Secondary | ICD-10-CM | POA: Diagnosis not present

## 2013-07-31 LAB — WET PREP, GENITAL
CLUE CELLS WET PREP: NONE SEEN
Trich, Wet Prep: NONE SEEN
YEAST WET PREP: NONE SEEN

## 2013-07-31 LAB — URINALYSIS, ROUTINE W REFLEX MICROSCOPIC
Bilirubin Urine: NEGATIVE
GLUCOSE, UA: NEGATIVE mg/dL
HGB URINE DIPSTICK: NEGATIVE
Ketones, ur: NEGATIVE mg/dL
Leukocytes, UA: NEGATIVE
Nitrite: NEGATIVE
PH: 6.5 (ref 5.0–8.0)
PROTEIN: NEGATIVE mg/dL
Specific Gravity, Urine: 1.02 (ref 1.005–1.030)
Urobilinogen, UA: 0.2 mg/dL (ref 0.0–1.0)

## 2013-07-31 LAB — OB RESULTS CONSOLE GC/CHLAMYDIA
CHLAMYDIA, DNA PROBE: NEGATIVE
GC PROBE AMP, GENITAL: NEGATIVE

## 2013-07-31 NOTE — MAU Note (Signed)
Cramping in lower abd started at 0400. No heavy, no bleeding, but has continued all day.

## 2013-07-31 NOTE — Discharge Instructions (Signed)
Your urine and wet prep today are normal. Your pain may be due to the ligaments that hold the uterus in place stretching as the baby grows. Follow up for your prenatal care. Return here as needed for problems.

## 2013-07-31 NOTE — MAU Provider Note (Signed)
CSN: [id number]     Arrival date & time 07/31/13  1601 History   None    Chief Complaint  Patient presents with  . Abdominal Cramping     (Consider location/radiation/quality/duration/timing/severity/associated sxs/prior Treatment) Patient is a 28 y.o. female presenting with cramps. The history is provided by the patient.  Abdominal Cramping The primary symptoms of the illness do not include fever, nausea, vomiting, dysuria, vaginal discharge or vaginal bleeding. The current episode started 3 to 5 hours ago. The onset of the illness was gradual.  The illness is associated with recent sexual activity. The patient states that she believes she is currently pregnant. The patient has not had a change in bowel habit.  Katrina Garcia is a 28 y.o. G1P0 @ [redacted]w[redacted]d gestation who presents to the MAU with lower abdominal cramping. Last sexual intercourse this morning. Hx of fibroids, HPV.   Past Medical History  Diagnosis Date  . Fibroid   . HPV (human papilloma virus) infection   . MDD (major depressive disorder)   . PTSD (post-traumatic stress disorder)   . Fibroid uterus 07/29/2013    Left subserosal 3.5 x 3.1 x 2.5 Left Intramural 1.2 x 1.3 x 1.4     Past Surgical History  Procedure Laterality Date  . Wisdom tooth extraction     History reviewed. No pertinent family history. History  Substance Use Topics  . Smoking status: Former Smoker    Types: Cigarettes    Quit date: 09/06/2011  . Smokeless tobacco: Never Used  . Alcohol Use: No   OB History   Grav Para Term Preterm Abortions TAB SAB Ect Mult Living   1              Review of Systems  Constitutional: Negative for fever.  Gastrointestinal: Negative for nausea and vomiting.  Genitourinary: Negative for dysuria, vaginal bleeding and vaginal discharge.      Allergies  Review of patient's allergies indicates no known allergies.  Home Medications   Prior to Admission medications   Medication Sig Start Date End Date Taking?  Authorizing Provider  acetaminophen (TYLENOL) 500 MG tablet Take 1,000 mg by mouth every 6 (six) hours as needed for headache.   Yes Historical Provider, MD  guaiFENesin-dextromethorphan (ROBITUSSIN DM) 100-10 MG/5ML syrup Take 30 mLs by mouth 2 (two) times daily.   Yes Historical Provider, MD  Prenatal Multivit-Min-Fe-FA (PRENATAL VITAMINS) 0.8 MG tablet Take 1 tablet by mouth daily. 06/01/13  Yes Allen Norris, MD   BP 129/71  Pulse 95  Temp(Src) 98.7 F (37.1 C) (Oral)  Resp 18  Ht 5' 1.5" (1.562 m)  Wt 184 lb (83.462 kg)  BMI 34.21 kg/m2  LMP 04/19/2013 Physical Exam  Nursing note and vitals reviewed. Constitutional: She is oriented to person, place, and time. She appears well-developed and well-nourished.  HENT:  Head: Normocephalic.  Eyes: EOM are normal.  Neck: Neck supple.  Cardiovascular: Normal rate.   Pulmonary/Chest: Effort normal.  Abdominal: Soft. There is no tenderness. There is no CVA tenderness.    There is mild tenderness low midline with deep palpation. No rebound or guarding. +FHT  Genitourinary:  External genitalia without lesions, white discharge vaginal vault. Cervix closed, long, no CMT no adnexal tenderness, uterus consistent with dates.   Musculoskeletal: Normal range of motion.  Neurological: She is alert and oriented to person, place, and time. No cranial nerve deficit.  Skin: Skin is warm and dry.  Psychiatric: She has a normal mood and affect. Her behavior is  normal.    ED Course  Procedures Results for orders placed during the hospital encounter of 07/31/13 (from the past 24 hour(s))  URINALYSIS, ROUTINE W REFLEX MICROSCOPIC     Status: None   Collection Time    07/31/13  4:20 PM      Result Value Ref Range   Color, Urine YELLOW  YELLOW   APPearance CLEAR  CLEAR   Specific Gravity, Urine 1.020  1.005 - 1.030   pH 6.5  5.0 - 8.0   Glucose, UA NEGATIVE  NEGATIVE mg/dL   Hgb urine dipstick NEGATIVE  NEGATIVE   Bilirubin Urine NEGATIVE   NEGATIVE   Ketones, ur NEGATIVE  NEGATIVE mg/dL   Protein, ur NEGATIVE  NEGATIVE mg/dL   Urobilinogen, UA 0.2  0.0 - 1.0 mg/dL   Nitrite NEGATIVE  NEGATIVE   Leukocytes, UA NEGATIVE  NEGATIVE  WET PREP, GENITAL     Status: Abnormal   Collection Time    07/31/13  5:20 PM      Result Value Ref Range   Yeast Wet Prep HPF POC NONE SEEN  NONE SEEN   Trich, Wet Prep NONE SEEN  NONE SEEN   Clue Cells Wet Prep HPF POC NONE SEEN  NONE SEEN   WBC, Wet Prep HPF POC FEW (*) NONE SEEN    MDM  28 y.o. G1P0 @[redacted]w[redacted]d  gestation with lower abdominal cramping. Stable for discharge without bleeding or pain at this time. Discussed with the patient and all questioned fully answered. She will return if any problems arise.

## 2013-07-31 NOTE — Telephone Encounter (Signed)
Patient called and left message stating she is having cramping this morning kind of like menstrual cramps but isn't having any bleeding and she has some concerns about that. Called patient back and she states she started to have some mild cramps this morning and felt some in her back too but they went away after a few hours. Told patient cramping can sometimes be normal and that we would be concerned if she were having severe cramps or bleeding like a period. Told patient she can also take tylenol if she needs to as well. Patient verbalized understanding and had no other questions

## 2013-08-01 LAB — GC/CHLAMYDIA PROBE AMP
CT PROBE, AMP APTIMA: NEGATIVE
GC Probe RNA: NEGATIVE

## 2013-08-07 ENCOUNTER — Inpatient Hospital Stay (HOSPITAL_COMMUNITY)
Admission: AD | Admit: 2013-08-07 | Discharge: 2013-08-07 | Disposition: A | Discharge status: Home / Self Care | Payer: Medicaid Other | Source: Ambulatory Visit | Attending: Obstetrics & Gynecology | Admitting: Obstetrics & Gynecology

## 2013-08-07 ENCOUNTER — Encounter (HOSPITAL_COMMUNITY): Payer: Self-pay | Admitting: *Deleted

## 2013-08-07 DIAGNOSIS — D252 Subserosal leiomyoma of uterus: Secondary | ICD-10-CM | POA: Diagnosis not present

## 2013-08-07 DIAGNOSIS — B977 Papillomavirus as the cause of diseases classified elsewhere: Secondary | ICD-10-CM | POA: Diagnosis not present

## 2013-08-07 DIAGNOSIS — O26859 Spotting complicating pregnancy, unspecified trimester: Secondary | ICD-10-CM | POA: Insufficient documentation

## 2013-08-07 DIAGNOSIS — Z87891 Personal history of nicotine dependence: Secondary | ICD-10-CM | POA: Insufficient documentation

## 2013-08-07 DIAGNOSIS — N949 Unspecified condition associated with female genital organs and menstrual cycle: Secondary | ICD-10-CM

## 2013-08-07 DIAGNOSIS — O3410 Maternal care for benign tumor of corpus uteri, unspecified trimester: Secondary | ICD-10-CM | POA: Insufficient documentation

## 2013-08-07 DIAGNOSIS — O99891 Other specified diseases and conditions complicating pregnancy: Secondary | ICD-10-CM | POA: Diagnosis not present

## 2013-08-07 DIAGNOSIS — R102 Pelvic and perineal pain: Secondary | ICD-10-CM

## 2013-08-07 DIAGNOSIS — O9989 Other specified diseases and conditions complicating pregnancy, childbirth and the puerperium: Secondary | ICD-10-CM

## 2013-08-07 DIAGNOSIS — R1031 Right lower quadrant pain: Secondary | ICD-10-CM | POA: Diagnosis present

## 2013-08-07 DIAGNOSIS — O98519 Other viral diseases complicating pregnancy, unspecified trimester: Secondary | ICD-10-CM | POA: Diagnosis not present

## 2013-08-07 DIAGNOSIS — O26899 Other specified pregnancy related conditions, unspecified trimester: Secondary | ICD-10-CM

## 2013-08-07 LAB — CBC WITH DIFFERENTIAL/PLATELET
Basophils Absolute: 0 10*3/uL (ref 0.0–0.1)
Basophils Relative: 0 % (ref 0–1)
EOS ABS: 0.2 10*3/uL (ref 0.0–0.7)
Eosinophils Relative: 1 % (ref 0–5)
HEMATOCRIT: 32 % — AB (ref 36.0–46.0)
Hemoglobin: 10.7 g/dL — ABNORMAL LOW (ref 12.0–15.0)
Lymphocytes Relative: 18 % (ref 12–46)
Lymphs Abs: 2.3 10*3/uL (ref 0.7–4.0)
MCH: 26.6 pg (ref 26.0–34.0)
MCHC: 33.4 g/dL (ref 30.0–36.0)
MCV: 79.4 fL (ref 78.0–100.0)
MONO ABS: 0.7 10*3/uL (ref 0.1–1.0)
Monocytes Relative: 6 % (ref 3–12)
Neutro Abs: 9.4 10*3/uL — ABNORMAL HIGH (ref 1.7–7.7)
Neutrophils Relative %: 75 % (ref 43–77)
PLATELETS: 284 10*3/uL (ref 150–400)
RBC: 4.03 MIL/uL (ref 3.87–5.11)
RDW: 13.8 % (ref 11.5–15.5)
WBC: 12.6 10*3/uL — ABNORMAL HIGH (ref 4.0–10.5)

## 2013-08-07 LAB — URINALYSIS, ROUTINE W REFLEX MICROSCOPIC
BILIRUBIN URINE: NEGATIVE
Glucose, UA: NEGATIVE mg/dL
HGB URINE DIPSTICK: NEGATIVE
KETONES UR: NEGATIVE mg/dL
LEUKOCYTES UA: NEGATIVE
Nitrite: NEGATIVE
PROTEIN: NEGATIVE mg/dL
Specific Gravity, Urine: 1.025 (ref 1.005–1.030)
Urobilinogen, UA: 0.2 mg/dL (ref 0.0–1.0)
pH: 6 (ref 5.0–8.0)

## 2013-08-07 NOTE — MAU Provider Note (Signed)
History     CSN: [id number]  Arrival date and time: 08/07/13 1153   First Provider Initiated Contact with Patient 08/07/13 1314      Chief Complaint  Patient presents with  . Abdominal Pain  . Vaginal Bleeding   HPI Katrina Garcia is 28 y.o. G1P0 [redacted]w[redacted]d weeks presenting with right lower sided cramping with spotting that began late this am.  Katrina Garcia is a patient in the clinic, last seen 07/19/13.  Next appt is 08/16/13.  Katrina Garcia was seen here on 7/27 for similar sxs but no spotting. On that date [place] and wet prep were normal, cultures were negative.  Last intercourse 1 week ago.  Katrina Garcia describes pain as crampy but "just sits there always on the right side" begin at lower midline and moves to rights. Katrina Garcia feels the cramping when Katrina Garcia moves--sitting or standing up.  Katrina Garcia stands with her job.   Is having intermittent nausea without vomiting that Katrina Garcia does not associate with the cramping.   Describes spotting as "fresh blood" with clots.  Katrina Garcia had U/S at [facility] 7/17-"all normal".     Past Medical History  Diagnosis Date  . Fibroid   . HPV (human papilloma virus) infection   . MDD (major depressive disorder)   . PTSD (post-traumatic stress disorder)   . Fibroid uterus 07/29/2013    Left subserosal 3.5 x 3.1 x 2.5 Left Intramural 1.2 x 1.3 x 1.4      Past Surgical History  Procedure Laterality Date  . Wisdom tooth extraction      History reviewed. No pertinent family history.  History  Substance Use Topics  . Smoking status: Former Smoker    Types: Cigarettes    Quit date: 09/06/2011  . Smokeless tobacco: Never Used  . Alcohol Use: No    Allergies: No Known Allergies  Prescriptions prior to admission  Medication Sig Dispense Refill  . acetaminophen (TYLENOL) 500 MG tablet Take 1,000 mg by mouth every 6 (six) hours as needed for headache.      . guaiFENesin-dextromethorphan (ROBITUSSIN DM) 100-10 MG/5ML syrup Take 30 mLs by mouth 2 (two) times daily.      . Prenatal Multivit-Min-Fe-FA (PRENATAL VITAMINS)  0.8 MG tablet Take 1 tablet by mouth daily.  90 tablet  3    Review of Systems  Constitutional: Negative for fever and chills.  Gastrointestinal: Positive for nausea and abdominal pain (cramping mid to lower right ). Negative for vomiting.  Genitourinary: Negative for dysuria, urgency, frequency and hematuria.       Spotting.  Neurological: Negative for headaches.   Physical Exam   Blood pressure 125/56, pulse 91, temperature 98.2 F (36.8 C), temperature source Oral, resp. rate 18, height 5' 1.5" (1.562 m), weight 184 lb 6 oz (83.632 kg), last menstrual period 04/19/2013.  Physical Exam  Constitutional: Katrina Garcia is oriented to person, place, and time. Katrina Garcia appears well-developed and well-nourished. No distress.  HENT:  Head: Normocephalic.  Neck: Normal range of motion.  Cardiovascular: Normal rate.   Respiratory: Effort normal.  GI: Soft. Katrina Garcia exhibits no distension and no mass. There is tenderness (mild lower bilateral tenderness ). There is no rebound and no guarding.  Genitourinary: There is no rash, tenderness or lesion on the right labia. There is no rash, tenderness or lesion on the left labia. Uterus is not tender. Enlarged: 15 week size. Cervix exhibits no discharge and no friability. No bleeding around the vagina. Vaginal discharge (normal appearing white discharge without odor) found.  Neurological: Katrina Garcia is  alert and oriented to person, place, and time.  Skin: Skin is warm and dry.  Psychiatric: Katrina Garcia has a normal mood and affect. Her behavior is normal.   FHR by doppler 152.  Results for orders placed during the hospital encounter of 08/07/13 (from the past 24 hour(s))  CBC WITH DIFFERENTIAL     Status: Abnormal   Collection Time    08/07/13  1:40 PM      Result Value Ref Range   WBC 12.6 (*) 4.0 - 10.5 K/uL   RBC 4.03  3.87 - 5.11 MIL/uL   Hemoglobin 10.7 (*) 12.0 - 15.0 g/dL   HCT 32.0 (*) 36.0 - 46.0 %   MCV 79.4  78.0 - 100.0 fL   MCH 26.6  26.0 - 34.0 pg   MCHC 33.4   30.0 - 36.0 g/dL   RDW 13.8  11.5 - 15.5 %   Platelets 284  150 - 400 K/uL   Neutrophils Relative % 75  43 - 77 %   Neutro Abs 9.4 (*) 1.7 - 7.7 K/uL   Lymphocytes Relative 18  12 - 46 %   Lymphs Abs 2.3  0.7 - 4.0 K/uL   Monocytes Relative 6  3 - 12 %   Monocytes Absolute 0.7  0.1 - 1.0 K/uL   Eosinophils Relative 1  0 - 5 %   Eosinophils Absolute 0.2  0.0 - 0.7 K/uL   Basophils Relative 0  0 - 1 %   Basophils Absolute 0.0  0.0 - 0.1 K/uL  URINALYSIS, ROUTINE W REFLEX MICROSCOPIC     Status: None   Collection Time    08/07/13  1:45 PM      Result Value Ref Range   Color, Urine YELLOW  YELLOW   APPearance CLEAR  CLEAR   Specific Gravity, Urine 1.025  1.005 - 1.030   pH 6.0  5.0 - 8.0   Glucose, UA NEGATIVE  NEGATIVE mg/dL   Hgb urine dipstick NEGATIVE  NEGATIVE   Bilirubin Urine NEGATIVE  NEGATIVE   Ketones, ur NEGATIVE  NEGATIVE mg/dL   Protein, ur NEGATIVE  NEGATIVE mg/dL   Urobilinogen, UA 0.2  0.0 - 1.0 mg/dL   Nitrite NEGATIVE  NEGATIVE   Leukocytes, UA NEGATIVE  NEGATIVE   MAU Course  Procedures  MDM Discussed labs and physical exam findings with the patient.  Assessment and Plan  A:  Lower abdominal cramping at 15w5 days gestation consistent with Round Ligament Pain  P: Tylenol prn      Move from sitting to standing, turning more slowly.      Report any further bleeding      Keep scheduled appointment in the clinic.   Rolanda Campa,EVE M 08/07/2013, 1:15 PM

## 2013-08-07 NOTE — MAU Note (Signed)
Patient unable to void at this time. Instructed to inform staff when she can give a urine sample for U/A.

## 2013-08-07 NOTE — MAU Note (Signed)
Patient presents with complaints of abdominal cramping since yesterday evening and spotting today.

## 2013-08-07 NOTE — Discharge Instructions (Signed)
Abdominal Pain During Pregnancy °Belly (abdominal) pain is common during pregnancy. Most of the time, it is not a serious problem. Other times, it can be a sign that something is wrong with the pregnancy. Always tell your doctor if you have belly pain. °HOME CARE °Monitor your belly pain for any changes. The following actions may help you feel better: °· Do not have sex (intercourse) or put anything in your vagina until you feel better. °· Rest until your pain stops. °· Drink clear fluids if you feel sick to your stomach (nauseous). Do not eat solid food until you feel better. °· Only take medicine as told by your doctor. °· Keep all doctor visits as told. °GET HELP RIGHT AWAY IF:  °· You are bleeding, leaking fluid, or pieces of tissue come out of your vagina. °· You have more pain or cramping. °· You keep throwing up (vomiting). °· You have pain when you pee (urinate) or have blood in your pee. °· You have a fever. °· You do not feel your baby moving as much. °· You feel very weak or feel like passing out. °· You have trouble breathing, with or without belly pain. °· You have a very bad headache and belly pain. °· You have fluid leaking from your vagina and belly pain. °· You keep having watery poop (diarrhea). °· Your belly pain does not go away after resting, or the pain gets worse. °MAKE SURE YOU:  °· Understand these instructions. °· Will watch your condition. °· Will get help right away if you are not doing well or get worse. °Document Released: 12/10/2008 Document Revised: 08/24/2012 Document Reviewed: 07/21/2012 °ExitCare® Patient Information ©2015 [employer]. This information is not intended to replace advice given to you by your health care provider. Make sure you discuss any questions you have with your health care provider. ° °

## 2013-08-07 NOTE — MAU Provider Note (Signed)
Attestation of Attending Supervision of Advanced Practitioner (CNM/NP): Evaluation and management procedures were performed by the Advanced Practitioner under my supervision and collaboration.  I have reviewed the Advanced Practitioner's note and chart, and I agree with the management and plan.  HARRAWAY-SMITH, Alexandra Posadas 5:25 PM

## 2013-08-16 ENCOUNTER — Ambulatory Visit (INDEPENDENT_AMBULATORY_CARE_PROVIDER_SITE_OTHER): Payer: Medicaid Other | Admitting: Obstetrics and Gynecology

## 2013-08-16 VITALS — BP 134/80 | HR 86 | Wt 188.0 lb

## 2013-08-16 DIAGNOSIS — Z3492 Encounter for supervision of normal pregnancy, unspecified, second trimester: Secondary | ICD-10-CM

## 2013-08-16 DIAGNOSIS — Z3480 Encounter for supervision of other normal pregnancy, unspecified trimester: Secondary | ICD-10-CM

## 2013-08-16 LAB — POCT URINALYSIS DIP (DEVICE)
Bilirubin Urine: NEGATIVE
GLUCOSE, UA: NEGATIVE mg/dL
Hgb urine dipstick: NEGATIVE
Leukocytes, UA: NEGATIVE
NITRITE: NEGATIVE
PH: 7 (ref 5.0–8.0)
Protein, ur: NEGATIVE mg/dL
Specific Gravity, Urine: 1.015 (ref 1.005–1.030)
UROBILINOGEN UA: 0.2 mg/dL (ref 0.0–1.0)

## 2013-08-16 NOTE — Patient Instructions (Signed)
Second Trimester of Pregnancy The second trimester is from week 13 through week 28, months 4 through 6. The second trimester is often a time when you feel your best. Your body has also adjusted to being pregnant, and you begin to feel better physically. Usually, morning sickness has lessened or quit completely, you may have more energy, and you may have an increase in appetite. The second trimester is also a time when the fetus is growing rapidly. At the end of the sixth month, the fetus is about 9 inches long and weighs about 1 pounds. You will likely begin to feel the baby move (quickening) between 18 and 20 weeks of the pregnancy. BODY CHANGES Your body goes through many changes during pregnancy. The changes vary from woman to woman.   Your weight will continue to increase. You will notice your lower abdomen bulging out.  You may begin to get stretch marks on your hips, abdomen, and breasts.  You may develop headaches that can be relieved by medicines approved by your health care provider.  You may urinate more often because the fetus is pressing on your bladder.  You may develop or continue to have heartburn as a result of your pregnancy.  You may develop constipation because certain hormones are causing the muscles that push waste through your intestines to slow down.  You may develop hemorrhoids or swollen, bulging veins (varicose veins).  You may have back pain because of the weight gain and pregnancy hormones relaxing your joints between the bones in your pelvis and as a result of a shift in weight and the muscles that support your balance.  Your breasts will continue to grow and be tender.  Your gums may bleed and may be sensitive to brushing and flossing.  Dark spots or blotches (chloasma, mask of pregnancy) may develop on your face. This will likely fade after the baby is born.  A dark line from your belly button to the pubic area (linea nigra) may appear. This will likely fade  after the baby is born.  You may have changes in your hair. These can include thickening of your hair, rapid growth, and changes in texture. Some women also have hair loss during or after pregnancy, or hair that feels dry or thin. Your hair will most likely return to normal after your baby is born. WHAT TO EXPECT AT YOUR PRENATAL VISITS During a routine prenatal visit:  You will be weighed to make sure you and the fetus are growing normally.  Your blood pressure will be taken.  Your abdomen will be measured to track your baby's growth.  The fetal heartbeat will be listened to.  Any test results from the previous visit will be discussed. Your health care provider may ask you:  How you are feeling.  If you are feeling the baby move.  If you have had any abnormal symptoms, such as leaking fluid, bleeding, severe headaches, or abdominal cramping.  If you have any questions. Other tests that may be performed during your second trimester include:  Blood tests that check for:  Low iron levels (anemia).  Gestational diabetes (between 24 and 28 weeks).  Rh antibodies.  Urine tests to check for infections, diabetes, or protein in the urine.  An ultrasound to confirm the proper growth and development of the baby.  An amniocentesis to check for possible genetic problems.  Fetal screens for spina bifida and Down syndrome. HOME CARE INSTRUCTIONS   Avoid all smoking, herbs, alcohol, and unprescribed   drugs. These chemicals affect the formation and growth of the baby.  Follow your health care provider's instructions regarding medicine use. There are medicines that are either safe or unsafe to take during pregnancy.  Exercise only as directed by your health care provider. Experiencing uterine cramps is a good sign to stop exercising.  Continue to eat regular, healthy meals.  Wear a good support bra for breast tenderness.  Do not use hot tubs, steam rooms, or saunas.  Wear your  seat belt at all times when driving.  Avoid raw meat, uncooked cheese, cat litter boxes, and soil used by cats. These carry germs that can cause birth defects in the baby.  Take your prenatal vitamins.  Try taking a stool softener (if your health care provider approves) if you develop constipation. Eat more high-fiber foods, such as fresh vegetables or fruit and whole grains. Drink plenty of fluids to keep your urine clear or pale yellow.  Take warm sitz baths to soothe any pain or discomfort caused by hemorrhoids. Use hemorrhoid cream if your health care provider approves.  If you develop varicose veins, wear support hose. Elevate your feet for 15 minutes, 3-4 times a day. Limit salt in your diet.  Avoid heavy lifting, wear low heel shoes, and practice good posture.  Rest with your legs elevated if you have leg cramps or low back pain.  Visit your dentist if you have not gone yet during your pregnancy. Use a soft toothbrush to brush your teeth and be gentle when you floss.  A sexual relationship may be continued unless your health care provider directs you otherwise.  Continue to go to all your prenatal visits as directed by your health care provider. SEEK MEDICAL CARE IF:   You have dizziness.  You have mild pelvic cramps, pelvic pressure, or nagging pain in the abdominal area.  You have persistent nausea, vomiting, or diarrhea.  You have a bad smelling vaginal discharge.  You have pain with urination. SEEK IMMEDIATE MEDICAL CARE IF:   You have a fever.  You are leaking fluid from your vagina.  You have spotting or bleeding from your vagina.  You have severe abdominal cramping or pain.  You have rapid weight gain or loss.  You have shortness of breath with chest pain.  You notice sudden or extreme swelling of your face, hands, ankles, feet, or legs.  You have not felt your baby move in over an hour.  You have severe headaches that do not go away with  medicine.  You have vision changes. Document Released: 12/16/2000 Document Revised: 12/27/2012 Document Reviewed: 02/23/2012 Exit[employer] Patient Information 2015 [employer]. This information is not intended to replace advice given to you by your health care provider. Make sure you discuss any questions you have with your health care provider.  

## 2013-08-16 NOTE — Progress Notes (Signed)
Patient is a 28yo primigravida presenting for an OB f/u. She was seen at the [facility]U for cramping and vaginal spotting. She denies any continued symptoms since that time. She denies any recent N/V. Patient is excited for baby's birth. Pregnancy was planned w/ FOB. Patient to receive additional AFP today. [place] scheduled for 8/28.

## 2013-08-16 NOTE — Progress Notes (Signed)
Needs refill on PNV.

## 2013-08-17 LAB — ALPHA FETOPROTEIN, MATERNAL
AFP: 47.4 IU/mL
Curr Gest Age: 17 wks.days
MOM FOR AFP: 1.66
Open Spina bifida: NEGATIVE

## 2013-09-01 ENCOUNTER — Ambulatory Visit (HOSPITAL_COMMUNITY)
Admission: RE | Admit: 2013-09-01 | Discharge: 2013-09-01 | Disposition: A | Discharge status: Home / Self Care | Payer: Medicaid Other | Source: Ambulatory Visit | Attending: Obstetrics and Gynecology | Admitting: Obstetrics and Gynecology

## 2013-09-01 ENCOUNTER — Other Ambulatory Visit (HOSPITAL_COMMUNITY): Payer: Self-pay | Admitting: Obstetrics and Gynecology

## 2013-09-01 DIAGNOSIS — Z0489 Encounter for examination and observation for other specified reasons: Secondary | ICD-10-CM

## 2013-09-01 DIAGNOSIS — O3410 Maternal care for benign tumor of corpus uteri, unspecified trimester: Secondary | ICD-10-CM | POA: Diagnosis present

## 2013-09-01 DIAGNOSIS — IMO0002 Reserved for concepts with insufficient information to code with codable children: Secondary | ICD-10-CM

## 2013-09-01 DIAGNOSIS — Z1389 Encounter for screening for other disorder: Secondary | ICD-10-CM | POA: Diagnosis not present

## 2013-09-01 DIAGNOSIS — O99210 Obesity complicating pregnancy, unspecified trimester: Secondary | ICD-10-CM | POA: Diagnosis not present

## 2013-09-01 DIAGNOSIS — O3412 Maternal care for benign tumor of corpus uteri, second trimester: Secondary | ICD-10-CM

## 2013-09-01 DIAGNOSIS — D259 Leiomyoma of uterus, unspecified: Secondary | ICD-10-CM | POA: Insufficient documentation

## 2013-09-01 DIAGNOSIS — E669 Obesity, unspecified: Secondary | ICD-10-CM | POA: Diagnosis not present

## 2013-09-01 DIAGNOSIS — Z363 Encounter for antenatal screening for malformations: Secondary | ICD-10-CM | POA: Insufficient documentation

## 2013-09-12 ENCOUNTER — Encounter: Payer: Self-pay | Admitting: General Practice

## 2013-09-13 ENCOUNTER — Ambulatory Visit (INDEPENDENT_AMBULATORY_CARE_PROVIDER_SITE_OTHER): Payer: Medicaid Other | Admitting: Advanced Practice Midwife

## 2013-09-13 VITALS — BP 135/77 | HR 86 | Wt 188.5 lb

## 2013-09-13 DIAGNOSIS — Z3492 Encounter for supervision of normal pregnancy, unspecified, second trimester: Secondary | ICD-10-CM

## 2013-09-13 DIAGNOSIS — Z3480 Encounter for supervision of other normal pregnancy, unspecified trimester: Secondary | ICD-10-CM

## 2013-09-13 DIAGNOSIS — Z23 Encounter for immunization: Secondary | ICD-10-CM

## 2013-09-13 DIAGNOSIS — IMO0002 Reserved for concepts with insufficient information to code with codable children: Secondary | ICD-10-CM

## 2013-09-13 DIAGNOSIS — Z0489 Encounter for examination and observation for other specified reasons: Secondary | ICD-10-CM

## 2013-09-13 DIAGNOSIS — Z1389 Encounter for screening for other disorder: Secondary | ICD-10-CM

## 2013-09-13 LAB — POCT URINALYSIS DIP (DEVICE)
BILIRUBIN URINE: NEGATIVE
GLUCOSE, UA: 250 mg/dL — AB
HGB URINE DIPSTICK: NEGATIVE
KETONES UR: NEGATIVE mg/dL
Leukocytes, UA: NEGATIVE
Nitrite: NEGATIVE
Protein, ur: NEGATIVE mg/dL
SPECIFIC GRAVITY, URINE: 1.01 (ref 1.005–1.030)
Urobilinogen, UA: 0.2 mg/dL (ref 0.0–1.0)
pH: 6 (ref 5.0–8.0)

## 2013-09-13 NOTE — Progress Notes (Signed)
Pt would like flu shot,.

## 2013-09-13 NOTE — Patient Instructions (Addendum)
.[employer]   Second Trimester of Pregnancy The second trimester is from week 13 through week 28, months 4 through 6. The second trimester is often a time when you feel your best. Your body has also adjusted to being pregnant, and you begin to feel better physically. Usually, morning sickness has lessened or quit completely, you may have more energy, and you may have an increase in appetite. The second trimester is also a time when the fetus is growing rapidly. At the end of the sixth month, the fetus is about 9 inches long and weighs about 1 pounds. You will likely begin to feel the baby move (quickening) between 18 and 20 weeks of the pregnancy. BODY CHANGES Your body goes through many changes during pregnancy. The changes vary from woman to woman.   Your weight will continue to increase. You will notice your lower abdomen bulging out.  You may begin to get stretch marks on your hips, abdomen, and breasts.  You may develop headaches that can be relieved by medicines approved by your health care provider.  You may urinate more often because the fetus is pressing on your bladder.  You may develop or continue to have heartburn as a result of your pregnancy.  You may develop constipation because certain hormones are causing the muscles that push waste through your intestines to slow down.  You may develop hemorrhoids or swollen, bulging veins (varicose veins).  You may have back pain because of the weight gain and pregnancy hormones relaxing your joints between the bones in your pelvis and as a result of a shift in weight and the muscles that support your balance.  Your breasts will continue to grow and be tender.  Your gums may bleed and may be sensitive to brushing and flossing.  Dark spots or blotches (chloasma, mask of pregnancy) may develop on your face. This will likely fade after the baby is born.  A dark line from your belly button to the pubic area (linea nigra) may appear.  This will likely fade after the baby is born.  You may have changes in your hair. These can include thickening of your hair, rapid growth, and changes in texture. Some women also have hair loss during or after pregnancy, or hair that feels dry or thin. Your hair will most likely return to normal after your baby is born. WHAT TO EXPECT AT YOUR PRENATAL VISITS During a routine prenatal visit:  You will be weighed to make sure you and the fetus are growing normally.  Your blood pressure will be taken.  Your abdomen will be measured to track your baby's growth.  The fetal heartbeat will be listened to.  Any test results from the previous visit will be discussed. Your health care provider may ask you:  How you are feeling.  If you are feeling the baby move.  If you have had any abnormal symptoms, such as leaking fluid, bleeding, severe headaches, or abdominal cramping.  If you have any questions. Other tests that may be performed during your second trimester include:  Blood tests that check for:  Low iron levels (anemia).  Gestational diabetes (between 24 and 28 weeks).  Rh antibodies.  Urine tests to check for infections, diabetes, or protein in the urine.  An ultrasound to confirm the proper growth and development of the baby.  An amniocentesis to check for possible genetic problems.  Fetal screens for spina bifida and Down syndrome. HOME CARE INSTRUCTIONS   Avoid all smoking, herbs,  alcohol, and unprescribed drugs. These chemicals affect the formation and growth of the baby.  Follow your health care provider's instructions regarding medicine use. There are medicines that are either safe or unsafe to take during pregnancy.  Exercise only as directed by your health care provider. Experiencing uterine cramps is a good sign to stop exercising.  Continue to eat regular, healthy meals.  Wear a good support bra for breast tenderness.  Do not use hot tubs, steam rooms, or  saunas.  Wear your seat belt at all times when driving.  Avoid raw meat, uncooked cheese, cat litter boxes, and soil used by cats. These carry germs that can cause birth defects in the baby.  Take your prenatal vitamins.  Try taking a stool softener (if your health care provider approves) if you develop constipation. Eat more high-fiber foods, such as fresh vegetables or fruit and whole grains. Drink plenty of fluids to keep your urine clear or pale yellow.  Take warm sitz baths to soothe any pain or discomfort caused by hemorrhoids. Use hemorrhoid cream if your health care provider approves.  If you develop varicose veins, wear support hose. Elevate your feet for 15 minutes, 3-4 times a day. Limit salt in your diet.  Avoid heavy lifting, wear low heel shoes, and practice good posture.  Rest with your legs elevated if you have leg cramps or low back pain.  Visit your dentist if you have not gone yet during your pregnancy. Use a soft toothbrush to brush your teeth and be gentle when you floss.  A sexual relationship may be continued unless your health care provider directs you otherwise.  Continue to go to all your prenatal visits as directed by your health care provider. SEEK MEDICAL CARE IF:   You have dizziness.  You have mild pelvic cramps, pelvic pressure, or nagging pain in the abdominal area.  You have persistent nausea, vomiting, or diarrhea.  You have a bad smelling vaginal discharge.  You have pain with urination. SEEK IMMEDIATE MEDICAL CARE IF:   You have a fever.  You are leaking fluid from your vagina.  You have spotting or bleeding from your vagina.  You have severe abdominal cramping or pain.  You have rapid weight gain or loss.  You have shortness of breath with chest pain.  You notice sudden or extreme swelling of your face, hands, ankles, feet, or legs.  You have not felt your baby move in over an hour.  You have severe headaches that do not go  away with medicine.  You have vision changes. Document Released: 12/16/2000 Document Revised: 12/27/2012 Document Reviewed: 02/23/2012 [facility] Patient Information 2015 [place], [place]. This information is not intended to replace advice given to you by your health care provider. Make sure you discuss any questions you have with your health care provider.  Breastfeeding Deciding to breastfeed is one of the best choices you can make for you and your baby. A change in hormones during pregnancy causes your breast tissue to grow and increases the number and size of your milk ducts. These hormones also allow proteins, sugars, and fats from your blood supply to make breast milk in your milk-producing glands. Hormones prevent breast milk from being released before your baby is born as well as prompt milk flow after birth. Once breastfeeding has begun, thoughts of your baby, as well as his or her sucking or crying, can stimulate the release of milk from your milk-producing glands.  BENEFITS OF BREASTFEEDING For Your Baby  Your first milk (colostrum) helps your baby's digestive system function better.   There are antibodies in your milk that help your baby fight off infections.   Your baby has a lower incidence of asthma, allergies, and sudden infant death syndrome.   The nutrients in breast milk are better for your baby than infant formulas and are designed uniquely for your baby's needs.   Breast milk improves your baby's brain development.   Your baby is less likely to develop other conditions, such as childhood obesity, asthma, or type 2 diabetes mellitus.  For You   Breastfeeding helps to create a very special bond between you and your baby.   Breastfeeding is convenient. Breast milk is always available at the correct temperature and costs nothing.   Breastfeeding helps to burn calories and helps you lose the weight gained during pregnancy.   Breastfeeding makes your uterus contract  to its prepregnancy size faster and slows bleeding (lochia) after you give birth.   Breastfeeding helps to lower your risk of developing type 2 diabetes mellitus, osteoporosis, and breast or ovarian cancer later in life. SIGNS THAT YOUR BABY IS HUNGRY Early Signs of Hunger  Increased alertness or activity.  Stretching.  Movement of the head from side to side.  Movement of the head and opening of the mouth when the corner of the mouth or cheek is stroked (rooting).  Increased sucking sounds, smacking lips, cooing, sighing, or squeaking.  Hand-to-mouth movements.  Increased sucking of fingers or hands. Late Signs of Hunger  Fussing.  Intermittent crying. Extreme Signs of Hunger Signs of extreme hunger will require calming and consoling before your baby will be able to breastfeed successfully. Do not wait for the following signs of extreme hunger to occur before you initiate breastfeeding:   Restlessness.  A loud, strong cry.   Screaming. BREASTFEEDING BASICS Breastfeeding Initiation  Find a comfortable place to sit or lie down, with your neck and back well supported.  Place a pillow or rolled up blanket under your baby to bring him or her to the level of your breast (if you are seated). Nursing pillows are specially designed to help support your arms and your baby while you breastfeed.  Make sure that your baby's abdomen is facing your abdomen.   Gently massage your breast. With your fingertips, massage from your chest wall toward your nipple in a circular motion. This encourages milk flow. You may need to continue this action during the feeding if your milk flows slowly.  Support your breast with 4 fingers underneath and your thumb above your nipple. Make sure your fingers are well away from your nipple and your baby's mouth.   Stroke your baby's lips gently with your finger or nipple.   When your baby's mouth is open wide enough, quickly bring your baby to your  breast, placing your entire nipple and as much of the colored area around your nipple (areola) as possible into your baby's mouth.   More areola should be visible above your baby's upper lip than below the lower lip.   Your baby's tongue should be between his or her lower gum and your breast.   Ensure that your baby's mouth is correctly positioned around your nipple (latched). Your baby's lips should create a seal on your breast and be turned out (everted).  It is common for your baby to suck about 2-3 minutes in order to start the flow of breast milk. Latching Teaching your baby how to latch on to  your breast properly is very important. An improper latch can cause nipple pain and decreased milk supply for you and poor weight gain in your baby. Also, if your baby is not latched onto your nipple properly, he or she may swallow some air during feeding. This can make your baby fussy. Burping your baby when you switch breasts during the feeding can help to get rid of the air. However, teaching your baby to latch on properly is still the best way to prevent fussiness from swallowing air while breastfeeding. Signs that your baby has successfully latched on to your nipple:    Silent tugging or silent sucking, without causing you pain.   Swallowing heard between every 3-4 sucks.    Muscle movement above and in front of his or her ears while sucking.  Signs that your baby has not successfully latched on to nipple:   Sucking sounds or smacking sounds from your baby while breastfeeding.  Nipple pain. If you think your baby has not latched on correctly, slip your finger into the corner of your baby's mouth to break the suction and place it between your baby's gums. Attempt breastfeeding initiation again. Signs of Successful Breastfeeding Signs from your baby:   A gradual decrease in the number of sucks or complete cessation of sucking.   Falling asleep.   Relaxation of his or her body.    Retention of a small amount of milk in his or her mouth.   Letting go of your breast by himself or herself. Signs from you:  Breasts that have increased in firmness, weight, and size 1-3 hours after feeding.   Breasts that are softer immediately after breastfeeding.  Increased milk volume, as well as a change in milk consistency and color by the fifth day of breastfeeding.   Nipples that are not sore, cracked, or bleeding. Signs That Your [employer] is Getting Enough Milk  Wetting at least 3 diapers in a 24-hour period. The urine should be clear and pale yellow by age 425 days.  At least 3 stools in a 24-hour period by age 425 days. The stool should be soft and yellow.  At least 3 stools in a 24-hour period by age 51 days. The stool should be seedy and yellow.  No loss of weight greater than 10% of birth weight during the first 54 days of age.  Average weight gain of 4-7 ounces (113-198 g) per week after age 42 days.  Consistent daily weight gain by age 97 days, without weight loss after the age of 2 weeks. After a feeding, your baby may spit up a small amount. This is common. BREASTFEEDING FREQUENCY AND DURATION Frequent feeding will help you make more milk and can prevent sore nipples and breast engorgement. Breastfeed when you feel the need to reduce the fullness of your breasts or when your baby shows signs of hunger. This is called "breastfeeding on demand." Avoid introducing a pacifier to your baby while you are working to establish breastfeeding (the first 4-6 weeks after your baby is born). After this time you may choose to use a pacifier. Research has shown that pacifier use during the first year of a baby's life decreases the risk of sudden infant death syndrome (SIDS). Allow your baby to feed on each breast as long as he or she wants. Breastfeed until your baby is finished feeding. When your baby unlatches or falls asleep while feeding from the first breast, offer the second breast.  Because newborns are often sleepy  in the first few weeks of life, you may need to awaken your baby to get him or her to feed. Breastfeeding times will vary from baby to baby. However, the following rules can serve as a guide to help you ensure that your baby is properly fed:  Newborns (babies 42 weeks of age or younger) may breastfeed every 1-3 hours.  Newborns should not go longer than 3 hours during the day or 5 hours during the night without breastfeeding.  You should breastfeed your baby a minimum of 8 times in a 24-hour period until you begin to introduce solid foods to your baby at around 70 months of age. BREAST MILK PUMPING Pumping and storing breast milk allows you to ensure that your baby is exclusively fed your breast milk, even at times when you are unable to breastfeed. This is especially important if you are going back to work while you are still breastfeeding or when you are not able to be present during feedings. Your lactation consultant can give you guidelines on how long it is safe to store breast milk.  A breast pump is a machine that allows you to pump milk from your breast into a sterile bottle. The pumped breast milk can then be stored in a refrigerator or freezer. Some breast pumps are operated by hand, while others use electricity. Ask your lactation consultant which type will work best for you. Breast pumps can be purchased, but some hospitals and breastfeeding support groups lease breast pumps on a monthly basis. A lactation consultant can teach you how to hand express breast milk, if you prefer not to use a pump.  CARING FOR YOUR BREASTS WHILE YOU BREASTFEED Nipples can become dry, cracked, and sore while breastfeeding. The following recommendations can help keep your breasts moisturized and healthy:  Avoid using soap on your nipples.   Wear a supportive bra. Although not required, special nursing bras and tank tops are designed to allow access to your breasts for  breastfeeding without taking off your entire bra or top. Avoid wearing underwire-style bras or extremely tight bras.  Air dry your nipples for 3-89minutes after each feeding.   Use only cotton bra pads to absorb leaked breast milk. Leaking of breast milk between feedings is normal.   Use lanolin on your nipples after breastfeeding. Lanolin helps to maintain your skin's normal moisture barrier. If you use pure lanolin, you do not need to wash it off before feeding your baby again. Pure lanolin is not toxic to your baby. You may also hand express a few drops of breast milk and gently massage that milk into your nipples and allow the milk to air dry. In the first few weeks after giving birth, some women experience extremely full breasts (engorgement). Engorgement can make your breasts feel heavy, warm, and tender to the touch. Engorgement peaks within 3-5 days after you give birth. The following recommendations can help ease engorgement:  Completely empty your breasts while breastfeeding or pumping. You may want to start by applying warm, moist heat (in the shower or with warm water-soaked hand towels) just before feeding or pumping. This increases circulation and helps the milk flow. If your baby does not completely empty your breasts while breastfeeding, pump any extra milk after he or she is finished.  Wear a snug bra (nursing or regular) or tank top for 1-2 days to signal your body to slightly decrease milk production.  Apply ice packs to your breasts, unless this is too uncomfortable for  you.  Make sure that your baby is latched on and positioned properly while breastfeeding. If engorgement persists after 48 hours of following these recommendations, contact your health care provider or a Science writer. OVERALL HEALTH CARE RECOMMENDATIONS WHILE BREASTFEEDING  Eat healthy foods. Alternate between meals and snacks, eating 3 of each per day. Because what you eat affects your breast milk,  some of the foods may make your baby more irritable than usual. Avoid eating these foods if you are sure that they are negatively affecting your baby.  Drink milk, fruit juice, and water to satisfy your thirst (about 10 glasses a day).   Rest often, relax, and continue to take your prenatal vitamins to prevent fatigue, stress, and anemia.  Continue breast self-awareness checks.  Avoid chewing and smoking tobacco.  Avoid alcohol and drug use. Some medicines that may be harmful to your baby can pass through breast milk. It is important to ask your health care provider before taking any medicine, including all over-the-counter and prescription medicine as well as vitamin and herbal supplements. It is possible to become pregnant while breastfeeding. If birth control is desired, ask your health care provider about options that will be safe for your baby. SEEK MEDICAL CARE IF:   You feel like you want to stop breastfeeding or have become frustrated with breastfeeding.  You have painful breasts or nipples.  Your nipples are cracked or bleeding.  Your breasts are red, tender, or warm.  You have a swollen area on either breast.  You have a fever or chills.  You have nausea or vomiting.  You have drainage other than breast milk from your nipples.  Your breasts do not become full before feedings by the fifth day after you give birth.  You feel sad and depressed.  Your baby is too sleepy to eat well.  Your baby is having trouble sleeping.   Your baby is wetting less than 3 diapers in a 24-hour period.  Your baby has less than 3 stools in a 24-hour period.  Your baby's skin or the white part of his or her eyes becomes yellow.   Your baby is not gaining weight by 23 days of age. SEEK IMMEDIATE MEDICAL CARE IF:   Your baby is overly tired (lethargic) and does not want to wake up and feed.  Your baby develops an unexplained fever. Document Released: 12/22/2004 Document Revised:  12/27/2012 Document Reviewed: 06/15/2012 [facility] Patient Information 2015 [place], [place]. This information is not intended to replace advice given to you by your health care provider. Make sure you discuss any questions you have with your health care provider.

## 2013-10-11 ENCOUNTER — Ambulatory Visit (INDEPENDENT_AMBULATORY_CARE_PROVIDER_SITE_OTHER): Payer: Medicaid Other | Admitting: Physician Assistant

## 2013-10-11 VITALS — BP 133/68 | HR 93 | Temp 98.6°F | Wt 189.9 lb

## 2013-10-11 DIAGNOSIS — Z3492 Encounter for supervision of normal pregnancy, unspecified, second trimester: Secondary | ICD-10-CM

## 2013-10-11 LAB — POCT URINALYSIS DIP (DEVICE)
BILIRUBIN URINE: NEGATIVE
GLUCOSE, UA: NEGATIVE mg/dL
HGB URINE DIPSTICK: NEGATIVE
KETONES UR: NEGATIVE mg/dL
Leukocytes, UA: NEGATIVE
NITRITE: NEGATIVE
Protein, ur: 30 mg/dL — AB
Specific Gravity, Urine: 1.025 (ref 1.005–1.030)
Urobilinogen, UA: 0.2 mg/dL (ref 0.0–1.0)
pH: 6 (ref 5.0–8.0)

## 2013-10-11 NOTE — Patient Instructions (Signed)
Second Trimester of Pregnancy The second trimester is from week 13 through week 28, months 4 through 6. The second trimester is often a time when you feel your best. Your body has also adjusted to being pregnant, and you begin to feel better physically. Usually, morning sickness has lessened or quit completely, you may have more energy, and you may have an increase in appetite. The second trimester is also a time when the fetus is growing rapidly. At the end of the sixth month, the fetus is about 9 inches long and weighs about 1 pounds. You will likely begin to feel the baby move (quickening) between 18 and 20 weeks of the pregnancy. BODY CHANGES Your body goes through many changes during pregnancy. The changes vary from woman to woman.   Your weight will continue to increase. You will notice your lower abdomen bulging out.  You may begin to get stretch marks on your hips, abdomen, and breasts.  You may develop headaches that can be relieved by medicines approved by your health care provider.  You may urinate more often because the fetus is pressing on your bladder.  You may develop or continue to have heartburn as a result of your pregnancy.  You may develop constipation because certain hormones are causing the muscles that push waste through your intestines to slow down.  You may develop hemorrhoids or swollen, bulging veins (varicose veins).  You may have back pain because of the weight gain and pregnancy hormones relaxing your joints between the bones in your pelvis and as a result of a shift in weight and the muscles that support your balance.  Your breasts will continue to grow and be tender.  Your gums may bleed and may be sensitive to brushing and flossing.  Dark spots or blotches (chloasma, mask of pregnancy) may develop on your face. This will likely fade after the baby is born.  A dark line from your belly button to the pubic area (linea nigra) may appear. This will likely fade  after the baby is born.  You may have changes in your hair. These can include thickening of your hair, rapid growth, and changes in texture. Some women also have hair loss during or after pregnancy, or hair that feels dry or thin. Your hair will most likely return to normal after your baby is born. WHAT TO EXPECT AT YOUR PRENATAL VISITS During a routine prenatal visit:  You will be weighed to make sure you and the fetus are growing normally.  Your blood pressure will be taken.  Your abdomen will be measured to track your baby's growth.  The fetal heartbeat will be listened to.  Any test results from the previous visit will be discussed. Your health care provider may ask you:  How you are feeling.  If you are feeling the baby move.  If you have had any abnormal symptoms, such as leaking fluid, bleeding, severe headaches, or abdominal cramping.  If you have any questions. Other tests that may be performed during your second trimester include:  Blood tests that check for:  Low iron levels (anemia).  Gestational diabetes (between 24 and 28 weeks).  Rh antibodies.  Urine tests to check for infections, diabetes, or protein in the urine.  An ultrasound to confirm the proper growth and development of the baby.  An amniocentesis to check for possible genetic problems.  Fetal screens for spina bifida and Down syndrome. HOME CARE INSTRUCTIONS   Avoid all smoking, herbs, alcohol, and unprescribed   drugs. These chemicals affect the formation and growth of the baby.  Follow your health care provider's instructions regarding medicine use. There are medicines that are either safe or unsafe to take during pregnancy.  Exercise only as directed by your health care provider. Experiencing uterine cramps is a good sign to stop exercising.  Continue to eat regular, healthy meals.  Wear a good support bra for breast tenderness.  Do not use hot tubs, steam rooms, or saunas.  Wear your  seat belt at all times when driving.  Avoid raw meat, uncooked cheese, cat litter boxes, and soil used by cats. These carry germs that can cause birth defects in the baby.  Take your prenatal vitamins.  Try taking a stool softener (if your health care provider approves) if you develop constipation. Eat more high-fiber foods, such as fresh vegetables or fruit and whole grains. Drink plenty of fluids to keep your urine clear or pale yellow.  Take warm sitz baths to soothe any pain or discomfort caused by hemorrhoids. Use hemorrhoid cream if your health care provider approves.  If you develop varicose veins, wear support hose. Elevate your feet for 15 minutes, 3-4 times a day. Limit salt in your diet.  Avoid heavy lifting, wear low heel shoes, and practice good posture.  Rest with your legs elevated if you have leg cramps or low back pain.  Visit your dentist if you have not gone yet during your pregnancy. Use a soft toothbrush to brush your teeth and be gentle when you floss.  A sexual relationship may be continued unless your health care provider directs you otherwise.  Continue to go to all your prenatal visits as directed by your health care provider. SEEK MEDICAL CARE IF:   You have dizziness.  You have mild pelvic cramps, pelvic pressure, or nagging pain in the abdominal area.  You have persistent nausea, vomiting, or diarrhea.  You have a bad smelling vaginal discharge.  You have pain with urination. SEEK IMMEDIATE MEDICAL CARE IF:   You have a fever.  You are leaking fluid from your vagina.  You have spotting or bleeding from your vagina.  You have severe abdominal cramping or pain.  You have rapid weight gain or loss.  You have shortness of breath with chest pain.  You notice sudden or extreme swelling of your face, hands, ankles, feet, or legs.  You have not felt your baby move in over an hour.  You have severe headaches that do not go away with  medicine.  You have vision changes. Document Released: 12/16/2000 Document Revised: 12/27/2012 Document Reviewed: 02/23/2012 Exit[employer] Patient Information 2015 [employer]. This information is not intended to replace advice given to you by your health care provider. Make sure you discuss any questions you have with your health care provider.  

## 2013-10-11 NOTE — Progress Notes (Signed)
25 weeks, reports good fetal movement, denies vaginal bleeding, abdominal pain, dysuria.   Encourage po fluids RTC 3 weeks  Will need GTT at that time.

## 2013-10-25 ENCOUNTER — Ambulatory Visit (HOSPITAL_COMMUNITY)
Admission: RE | Admit: 2013-10-25 | Discharge: 2013-10-25 | Disposition: A | Discharge status: Home / Self Care | Payer: Medicaid Other | Source: Ambulatory Visit | Attending: Physician Assistant | Admitting: Physician Assistant

## 2013-10-25 ENCOUNTER — Encounter (HOSPITAL_COMMUNITY): Payer: Self-pay

## 2013-10-25 ENCOUNTER — Ambulatory Visit (HOSPITAL_COMMUNITY)
Admission: RE | Admit: 2013-10-25 | Discharge: 2013-10-25 | Disposition: A | Discharge status: Home / Self Care | Payer: Medicaid Other | Source: Ambulatory Visit | Attending: Obstetrics and Gynecology | Admitting: Obstetrics and Gynecology

## 2013-10-25 VITALS — BP 121/71 | HR 82 | Wt 191.8 lb

## 2013-10-25 DIAGNOSIS — IMO0002 Reserved for concepts with insufficient information to code with codable children: Secondary | ICD-10-CM

## 2013-10-25 DIAGNOSIS — D259 Leiomyoma of uterus, unspecified: Secondary | ICD-10-CM | POA: Diagnosis not present

## 2013-10-25 DIAGNOSIS — Z3A27 27 weeks gestation of pregnancy: Secondary | ICD-10-CM | POA: Insufficient documentation

## 2013-10-25 DIAGNOSIS — O3412 Maternal care for benign tumor of corpus uteri, second trimester: Secondary | ICD-10-CM | POA: Insufficient documentation

## 2013-10-25 DIAGNOSIS — O358XX0 Maternal care for other (suspected) fetal abnormality and damage, not applicable or unspecified: Secondary | ICD-10-CM | POA: Insufficient documentation

## 2013-10-25 DIAGNOSIS — O350XX1 Maternal care for (suspected) central nervous system malformation in fetus, fetus 1: Secondary | ICD-10-CM

## 2013-10-25 DIAGNOSIS — Z0489 Encounter for examination and observation for other specified reasons: Secondary | ICD-10-CM

## 2013-10-25 DIAGNOSIS — Z36 Encounter for antenatal screening of mother: Secondary | ICD-10-CM | POA: Insufficient documentation

## 2013-10-25 DIAGNOSIS — IMO0001 Reserved for inherently not codable concepts without codable children: Secondary | ICD-10-CM

## 2013-10-26 LAB — TOXOPLASMA ANTIBODIES- IGG AND  IGM

## 2013-10-26 LAB — PARVOVIRUS B19 ANTIBODY, IGG AND IGM
PAROVIRUS B19 IGM ABS: 0.2 {index} (ref <0.9)
Parovirus B19 IgG Abs: 0.3 index (ref <0.9)

## 2013-10-26 LAB — CMV ANTIBODY, IGG (EIA)

## 2013-10-26 LAB — CMV IGM: CMV IgM: <8 AU/mL (ref <30.00)

## 2013-10-27 ENCOUNTER — Telehealth (HOSPITAL_COMMUNITY): Payer: Self-pay | Admitting: *Deleted

## 2013-10-27 ENCOUNTER — Encounter: Payer: Self-pay | Admitting: Advanced Practice Midwife

## 2013-10-27 DIAGNOSIS — O3509X0 Maternal care for (suspected) other central nervous system malformation or damage in fetus, not applicable or unspecified: Secondary | ICD-10-CM | POA: Insufficient documentation

## 2013-10-27 DIAGNOSIS — O350XX0 Maternal care for (suspected) central nervous system malformation in fetus, not applicable or unspecified: Secondary | ICD-10-CM | POA: Insufficient documentation

## 2013-10-27 NOTE — Telephone Encounter (Signed)
Called patient with her lab results from CMV, Toxo, and Parvo titers due to US findings for ventriculomegaly.  Informed patient that these are within normal limits.  Katrina Garcia is still pending at this time.  She will receive a call from our genetic counselor once these results are available.  Pt verbalized understanding.  No further questions.

## 2013-11-01 ENCOUNTER — Ambulatory Visit (INDEPENDENT_AMBULATORY_CARE_PROVIDER_SITE_OTHER): Payer: Medicaid Other | Admitting: Physician Assistant

## 2013-11-01 VITALS — BP 128/65 | HR 101 | Temp 97.8°F | Wt 192.0 lb

## 2013-11-01 DIAGNOSIS — Z3493 Encounter for supervision of normal pregnancy, unspecified, third trimester: Secondary | ICD-10-CM

## 2013-11-01 DIAGNOSIS — Z23 Encounter for immunization: Secondary | ICD-10-CM

## 2013-11-01 LAB — CBC
HCT: 32 % — ABNORMAL LOW (ref 36.0–46.0)
Hemoglobin: 10.8 g/dL — ABNORMAL LOW (ref 12.0–15.0)
MCH: 26.9 pg (ref 26.0–34.0)
MCHC: 33.8 g/dL (ref 30.0–36.0)
MCV: 79.6 fL (ref 78.0–100.0)
PLATELETS: 289 10*3/uL (ref 150–400)
RBC: 4.02 MIL/uL (ref 3.87–5.11)
RDW: 14 % (ref 11.5–15.5)
WBC: 10 10*3/uL (ref 4.0–10.5)

## 2013-11-01 LAB — POCT URINALYSIS DIP (DEVICE)
Bilirubin Urine: NEGATIVE
Glucose, UA: 100 mg/dL — AB
Hgb urine dipstick: NEGATIVE
KETONES UR: NEGATIVE mg/dL
LEUKOCYTES UA: NEGATIVE
Nitrite: NEGATIVE
PH: 7 (ref 5.0–8.0)
PROTEIN: NEGATIVE mg/dL
Specific Gravity, Urine: 1.02 (ref 1.005–1.030)
UROBILINOGEN UA: 0.2 mg/dL (ref 0.0–1.0)

## 2013-11-01 LAB — RPR

## 2013-11-01 LAB — HIV ANTIBODY (ROUTINE TESTING W REFLEX): HIV 1&2 Ab, 4th Generation: NONREACTIVE

## 2013-11-01 MED ORDER — TETANUS-DIPHTH-ACELL PERTUSSIS 5-2.5-18.5 LF-MCG/0.5 IM SUSP: 0.5000 mL | Freq: Once | INTRAMUSCULAR | Status: DC

## 2013-11-01 NOTE — Progress Notes (Signed)
28 weeks GTT, 28 week labs, Tdap today [facility], contraception discussed Awaiting results of down syndrome testing - MFM to call pt RTC 2 weeks

## 2013-11-01 NOTE — Progress Notes (Signed)
Reports intermittent pelvic pain/pressure.  1hr gtt and labs today.

## 2013-11-01 NOTE — Patient Instructions (Signed)
Breastfeeding Deciding to breastfeed is one of the best choices you can make for you and your baby. A change in hormones during pregnancy causes your breast tissue to grow and increases the number and size of your milk ducts. These hormones also allow proteins, sugars, and fats from your blood supply to make breast milk in your milk-producing glands. Hormones prevent breast milk from being released before your baby is born as well as prompt milk flow after birth. Once breastfeeding has begun, thoughts of your baby, as well as his or her sucking or crying, can stimulate the release of milk from your milk-producing glands.  BENEFITS OF BREASTFEEDING For Your Baby  Your first milk (colostrum) helps your baby's digestive system function better.   There are antibodies in your milk that help your baby fight off infections.   Your baby has a lower incidence of asthma, allergies, and sudden infant death syndrome.   The nutrients in breast milk are better for your baby than infant formulas and are designed uniquely for your baby's needs.   Breast milk improves your baby's brain development.   Your baby is less likely to develop other conditions, such as childhood obesity, asthma, or type 2 diabetes mellitus.  For You   Breastfeeding helps to create a very special bond between you and your baby.   Breastfeeding is convenient. Breast milk is always available at the correct temperature and costs nothing.   Breastfeeding helps to burn calories and helps you lose the weight gained during pregnancy.   Breastfeeding makes your uterus contract to its prepregnancy size faster and slows bleeding (lochia) after you give birth.   Breastfeeding helps to lower your risk of developing type 2 diabetes mellitus, osteoporosis, and breast or ovarian cancer later in life. SIGNS THAT YOUR BABY IS HUNGRY Early Signs of Hunger  Increased alertness or activity.  Stretching.  Movement of the head from  side to side.  Movement of the head and opening of the mouth when the corner of the mouth or cheek is stroked (rooting).  Increased sucking sounds, smacking lips, cooing, sighing, or squeaking.  Hand-to-mouth movements.  Increased sucking of fingers or hands. Late Signs of Hunger  Fussing.  Intermittent crying. Extreme Signs of Hunger Signs of extreme hunger will require calming and consoling before your baby will be able to breastfeed successfully. Do not wait for the following signs of extreme hunger to occur before you initiate breastfeeding:   Restlessness.  A loud, strong cry.   Screaming. BREASTFEEDING BASICS Breastfeeding Initiation  Find a comfortable place to sit or lie down, with your neck and back well supported.  Place a pillow or rolled up blanket under your baby to bring him or her to the level of your breast (if you are seated). Nursing pillows are specially designed to help support your arms and your baby while you breastfeed.  Make sure that your baby's abdomen is facing your abdomen.   Gently massage your breast. With your fingertips, massage from your chest wall toward your nipple in a circular motion. This encourages milk flow. You may need to continue this action during the feeding if your milk flows slowly.  Support your breast with 4 fingers underneath and your thumb above your nipple. Make sure your fingers are well away from your nipple and your baby's mouth.   Stroke your baby's lips gently with your finger or nipple.   When your baby's mouth is open wide enough, quickly bring your baby to your  breast, placing your entire nipple and as much of the colored area around your nipple (areola) as possible into your baby's mouth.   More areola should be visible above your baby's upper lip than below the lower lip.   Your baby's tongue should be between his or her lower gum and your breast.   Ensure that your baby's mouth is correctly positioned  around your nipple (latched). Your baby's lips should create a seal on your breast and be turned out (everted).  It is common for your baby to suck about 2-3 minutes in order to start the flow of breast milk. Latching Teaching your baby how to latch on to your breast properly is very important. An improper latch can cause nipple pain and decreased milk supply for you and poor weight gain in your baby. Also, if your baby is not latched onto your nipple properly, he or she may swallow some air during feeding. This can make your baby fussy. Burping your baby when you switch breasts during the feeding can help to get rid of the air. However, teaching your baby to latch on properly is still the best way to prevent fussiness from swallowing air while breastfeeding. Signs that your baby has successfully latched on to your nipple:    Silent tugging or silent sucking, without causing you pain.   Swallowing heard between every 3-4 sucks.    Muscle movement above and in front of his or her ears while sucking.  Signs that your baby has not successfully latched on to nipple:   Sucking sounds or smacking sounds from your baby while breastfeeding.  Nipple pain. If you think your baby has not latched on correctly, slip your finger into the corner of your baby's mouth to break the suction and place it between your baby's gums. Attempt breastfeeding initiation again. Signs of Successful Breastfeeding Signs from your baby:   A gradual decrease in the number of sucks or complete cessation of sucking.   Falling asleep.   Relaxation of his or her body.   Retention of a small amount of milk in his or her mouth.   Letting go of your breast by himself or herself. Signs from you:  Breasts that have increased in firmness, weight, and size 1-3 hours after feeding.   Breasts that are softer immediately after breastfeeding.  Increased milk volume, as well as a change in milk consistency and color by  the fifth day of breastfeeding.   Nipples that are not sore, cracked, or bleeding. Signs That Your Randel Books is Getting Enough Milk  Wetting at least 3 diapers in a 24-hour period. The urine should be clear and pale yellow by age 11 days.  At least 3 stools in a 24-hour period by age 11 days. The stool should be soft and yellow.  At least 3 stools in a 24-hour period by age 18 days. The stool should be seedy and yellow.  No loss of weight greater than 10% of birth weight during the first 55 days of age.  Average weight gain of 4-7 ounces (113-198 g) per week after age 36 days.  Consistent daily weight gain by age 9 days, without weight loss after the age of 2 weeks. After a feeding, your baby may spit up a small amount. This is common. BREASTFEEDING FREQUENCY AND DURATION Frequent feeding will help you make more milk and can prevent sore nipples and breast engorgement. Breastfeed when you feel the need to reduce the fullness of your breasts  or when your baby shows signs of hunger. This is called "breastfeeding on demand." Avoid introducing a pacifier to your baby while you are working to establish breastfeeding (the first 4-6 weeks after your baby is born). After this time you may choose to use a pacifier. Research has shown that pacifier use during the first year of a baby's life decreases the risk of sudden infant death syndrome (SIDS). Allow your baby to feed on each breast as long as he or she wants. Breastfeed until your baby is finished feeding. When your baby unlatches or falls asleep while feeding from the first breast, offer the second breast. Because newborns are often sleepy in the first few weeks of life, you may need to awaken your baby to get him or her to feed. Breastfeeding times will vary from baby to baby. However, the following rules can serve as a guide to help you ensure that your baby is properly fed:  Newborns (babies 5 weeks of age or younger) may breastfeed every 1-3  hours.  Newborns should not go longer than 3 hours during the day or 5 hours during the night without breastfeeding.  You should breastfeed your baby a minimum of 8 times in a 24-hour period until you begin to introduce solid foods to your baby at around 54 months of age. BREAST MILK PUMPING Pumping and storing breast milk allows you to ensure that your baby is exclusively fed your breast milk, even at times when you are unable to breastfeed. This is especially important if you are going back to work while you are still breastfeeding or when you are not able to be present during feedings. Your lactation consultant can give you guidelines on how long it is safe to store breast milk.  A breast pump is a machine that allows you to pump milk from your breast into a sterile bottle. The pumped breast milk can then be stored in a refrigerator or freezer. Some breast pumps are operated by hand, while others use electricity. Ask your lactation consultant which type will work best for you. Breast pumps can be purchased, but some hospitals and breastfeeding support groups lease breast pumps on a monthly basis. A lactation consultant can teach you how to hand express breast milk, if you prefer not to use a pump.  CARING FOR YOUR BREASTS WHILE YOU BREASTFEED Nipples can become dry, cracked, and sore while breastfeeding. The following recommendations can help keep your breasts moisturized and healthy:  Avoid using soap on your nipples.   Wear a supportive bra. Although not required, special nursing bras and tank tops are designed to allow access to your breasts for breastfeeding without taking off your entire bra or top. Avoid wearing underwire-style bras or extremely tight bras.  Air dry your nipples for 3-98minutes after each feeding.   Use only cotton bra pads to absorb leaked breast milk. Leaking of breast milk between feedings is normal.   Use lanolin on your nipples after breastfeeding. Lanolin helps to  maintain your skin's normal moisture barrier. If you use pure lanolin, you do not need to wash it off before feeding your baby again. Pure lanolin is not toxic to your baby. You may also hand express a few drops of breast milk and gently massage that milk into your nipples and allow the milk to air dry. In the first few weeks after giving birth, some women experience extremely full breasts (engorgement). Engorgement can make your breasts feel heavy, warm, and tender to the  touch. Engorgement peaks within 3-5 days after you give birth. The following recommendations can help ease engorgement:  Completely empty your breasts while breastfeeding or pumping. You may want to start by applying warm, moist heat (in the shower or with warm water-soaked hand towels) just before feeding or pumping. This increases circulation and helps the milk flow. If your baby does not completely empty your breasts while breastfeeding, pump any extra milk after he or she is finished.  Wear a snug bra (nursing or regular) or tank top for 1-2 days to signal your body to slightly decrease milk production.  Apply ice packs to your breasts, unless this is too uncomfortable for you.  Make sure that your baby is latched on and positioned properly while breastfeeding. If engorgement persists after 48 hours of following these recommendations, contact your health care provider or a lactation consultant. OVERALL HEALTH CARE RECOMMENDATIONS WHILE BREASTFEEDING  Eat healthy foods. Alternate between meals and snacks, eating 3 of each per day. Because what you eat affects your breast milk, some of the foods may make your baby more irritable than usual. Avoid eating these foods if you are sure that they are negatively affecting your baby.  Drink milk, fruit juice, and water to satisfy your thirst (about 10 glasses a day).   Rest often, relax, and continue to take your prenatal vitamins to prevent fatigue, stress, and anemia.  Continue  breast self-awareness checks.  Avoid chewing and smoking tobacco.  Avoid alcohol and drug use. Some medicines that may be harmful to your baby can pass through breast milk. It is important to ask your health care provider before taking any medicine, including all over-the-counter and prescription medicine as well as vitamin and herbal supplements. It is possible to become pregnant while breastfeeding. If birth control is desired, ask your health care provider about options that will be safe for your baby. SEEK MEDICAL CARE IF:   You feel like you want to stop breastfeeding or have become frustrated with breastfeeding.  You have painful breasts or nipples.  Your nipples are cracked or bleeding.  Your breasts are red, tender, or warm.  You have a swollen area on either breast.  You have a fever or chills.  You have nausea or vomiting.  You have drainage other than breast milk from your nipples.  Your breasts do not become full before feedings by the fifth day after you give birth.  You feel sad and depressed.  Your baby is too sleepy to eat well.  Your baby is having trouble sleeping.   Your baby is wetting less than 3 diapers in a 24-hour period.  Your baby has less than 3 stools in a 24-hour period.  Your baby's skin or the white part of his or her eyes becomes yellow.   Your baby is not gaining weight by 5 days of age. SEEK IMMEDIATE MEDICAL CARE IF:   Your baby is overly tired (lethargic) and does not want to wake up and feed.  Your baby develops an unexplained fever. Document Released: 12/22/2004 Document Revised: 12/27/2012 Document Reviewed: 06/15/2012 Exit[employer] Patient Information 2015 [employer]. This information is not intended to replace advice given to you by your health care provider. Make sure you discuss any questions you have with your health care provider.  

## 2013-11-02 ENCOUNTER — Telehealth (HOSPITAL_COMMUNITY): Payer: Self-pay | Admitting: MS"

## 2013-11-02 LAB — GLUCOSE TOLERANCE, 1 HOUR (50G) W/O FASTING: GLUCOSE 1 HOUR GTT: 168 mg/dL — AB (ref 70–140)

## 2013-11-02 NOTE — Telephone Encounter (Signed)
Called Katrina Garcia to discuss her cell free fetal DNA test results.  Katrina Garcia had Panorama testing through [facility].  Testing was offered because of cerebral ventriculomegaly on fetal ultrasound.   The patient was identified by name and DOB.  We reviewed that these are within normal limits, showing a less than 1 in 10,000 risk for trisomies 21, 18 and 13, and monosomy X (Turner syndrome).  In addition, the risk for triploidy/vanishing twin and sex chromosome trisomies (47,XXX and 47,XXY) was also low risk.  [name]  elected to have cffDNA analysis for 22q11 deletion syndrome, which was also low risk (<1 in 13,300).  We reviewed that this testing identifies > 99% of pregnancies with trisomy 103, trisomy 62, sex chromosome trisomies (47,XXX and 47,XXY), and triploidy. The detection rate for trisomy 18 is 96%.  The detection rate for monosomy X is ~92%.  The false positive rate is <0.1% for all conditions. Testing was also consistent with female fetal sex. She understands that this testing does not identify all genetic conditions.  All questions were answered to her satisfaction, she was encouraged to call with additional questions or concerns.  Chipper Oman, MS Insurance risk surveyor

## 2013-11-06 ENCOUNTER — Encounter (HOSPITAL_COMMUNITY): Payer: Self-pay

## 2013-11-09 ENCOUNTER — Telehealth: Payer: Self-pay

## 2013-11-09 NOTE — Telephone Encounter (Signed)
Patient returned call. Called patient. No answer. Left message stating we are returning your call, please call clinic and leave message if it OK to leave detailed message on voicemail.

## 2013-11-09 NOTE — Telephone Encounter (Signed)
Pt called back and left message to call her back ASAP.

## 2013-11-09 NOTE — Telephone Encounter (Signed)
-----   Message from Paticia Stack, PA-C sent at 11/09/2013  7:19 AM EST ----- Regarding: pt needs 3 hour gtt Pt failed one hour gtt.  Please schedule for 3 hour gtt.

## 2013-11-09 NOTE — Telephone Encounter (Signed)
Attempted to contact patient. NO answer. Left message stating we are calling with results and to set up an appointment, please call clinic.

## 2013-11-13 NOTE — Telephone Encounter (Signed)
Patient called and left message stating she is returning our call and if she doesn't answer we can leave messages on her voicemail. Called patient back and informed her of elevated one hour gtt and need for 3 hr. Also explained to patient that she will need to be here for 3 hrs and will need to be fasting after midnight. Patient verbalized understanding to all and states she can come tomorrow @ 830. Patient had no other questions

## 2013-11-14 ENCOUNTER — Other Ambulatory Visit: Payer: Medicaid Other

## 2013-11-14 ENCOUNTER — Other Ambulatory Visit (HOSPITAL_COMMUNITY): Payer: Self-pay

## 2013-11-14 DIAGNOSIS — R7309 Other abnormal glucose: Secondary | ICD-10-CM

## 2013-11-15 LAB — GLUCOSE TOLERANCE, 3 HOURS
GLUCOSE 3 HOUR GTT: 117 mg/dL (ref 70–144)
Glucose Tolerance, 1 hour: 156 mg/dL (ref 70–189)
Glucose Tolerance, 2 hour: 147 mg/dL (ref 70–164)
Glucose Tolerance, Fasting: 90 mg/dL (ref 70–104)

## 2013-11-20 ENCOUNTER — Telehealth: Payer: Self-pay

## 2013-11-20 NOTE — Telephone Encounter (Signed)
Attempted to contact patient with normal results. Will inform patient at appointment on 11/22/13.

## 2013-11-20 NOTE — Telephone Encounter (Signed)
-----   Message from Woodroe Mode, MD sent at 11/17/2013  2:04 PM EST ----- Normal result

## 2013-11-22 ENCOUNTER — Encounter: Payer: Self-pay | Admitting: Physician Assistant

## 2013-11-22 ENCOUNTER — Ambulatory Visit (INDEPENDENT_AMBULATORY_CARE_PROVIDER_SITE_OTHER): Payer: Medicaid Other | Admitting: Physician Assistant

## 2013-11-22 ENCOUNTER — Ambulatory Visit (HOSPITAL_COMMUNITY)
Admission: RE | Admit: 2013-11-22 | Discharge: 2013-11-22 | Disposition: A | Discharge status: Home / Self Care | Payer: Medicaid Other | Source: Ambulatory Visit | Attending: Obstetrics and Gynecology | Admitting: Obstetrics and Gynecology

## 2013-11-22 ENCOUNTER — Encounter (HOSPITAL_COMMUNITY): Payer: Self-pay

## 2013-11-22 VITALS — BP 137/84 | HR 83 | Temp 98.4°F | Wt 194.5 lb

## 2013-11-22 DIAGNOSIS — O99213 Obesity complicating pregnancy, third trimester: Secondary | ICD-10-CM | POA: Insufficient documentation

## 2013-11-22 DIAGNOSIS — IMO0002 Reserved for concepts with insufficient information to code with codable children: Secondary | ICD-10-CM | POA: Insufficient documentation

## 2013-11-22 DIAGNOSIS — O350XX1 Maternal care for (suspected) central nervous system malformation in fetus, fetus 1: Secondary | ICD-10-CM

## 2013-11-22 DIAGNOSIS — Z3A31 31 weeks gestation of pregnancy: Secondary | ICD-10-CM | POA: Insufficient documentation

## 2013-11-22 DIAGNOSIS — D259 Leiomyoma of uterus, unspecified: Secondary | ICD-10-CM | POA: Insufficient documentation

## 2013-11-22 DIAGNOSIS — O350XX0 Maternal care for (suspected) central nervous system malformation in fetus, not applicable or unspecified: Secondary | ICD-10-CM | POA: Diagnosis not present

## 2013-11-22 DIAGNOSIS — O3509X1 Maternal care for (suspected) other central nervous system malformation or damage in fetus, fetus 1: Secondary | ICD-10-CM

## 2013-11-22 DIAGNOSIS — O3413 Maternal care for benign tumor of corpus uteri, third trimester: Secondary | ICD-10-CM | POA: Diagnosis present

## 2013-11-22 DIAGNOSIS — Z0489 Encounter for examination and observation for other specified reasons: Secondary | ICD-10-CM

## 2013-11-22 DIAGNOSIS — IMO0001 Reserved for inherently not codable concepts without codable children: Secondary | ICD-10-CM

## 2013-11-22 DIAGNOSIS — Z3493 Encounter for supervision of normal pregnancy, unspecified, third trimester: Secondary | ICD-10-CM

## 2013-11-22 LAB — POCT URINALYSIS DIP (DEVICE)
Bilirubin Urine: NEGATIVE
Glucose, UA: NEGATIVE mg/dL
Hgb urine dipstick: NEGATIVE
Ketones, ur: NEGATIVE mg/dL
Leukocytes, UA: NEGATIVE
NITRITE: NEGATIVE
PROTEIN: NEGATIVE mg/dL
SPECIFIC GRAVITY, URINE: 1.02 (ref 1.005–1.030)
UROBILINOGEN UA: 0.2 mg/dL (ref 0.0–1.0)
pH: 7 (ref 5.0–8.0)

## 2013-11-22 NOTE — Progress Notes (Signed)
31 weeks.  Stable.  No contractions, LOF, dysuria.  Endorses good fetal movement.   Continue PNV PTL precautions discussed Monitor blood pressure.  Diet discussed.   RTC 2 weeks for ROB

## 2013-11-22 NOTE — Patient Instructions (Signed)
Breastfeeding Deciding to breastfeed is one of the best choices you can make for you and your baby. A change in hormones during pregnancy causes your breast tissue to grow and increases the number and size of your milk ducts. These hormones also allow proteins, sugars, and fats from your blood supply to make breast milk in your milk-producing glands. Hormones prevent breast milk from being released before your baby is born as well as prompt milk flow after birth. Once breastfeeding has begun, thoughts of your baby, as well as his or her sucking or crying, can stimulate the release of milk from your milk-producing glands.  BENEFITS OF BREASTFEEDING For Your Baby  Your first milk (colostrum) helps your baby's digestive system function better.   There are antibodies in your milk that help your baby fight off infections.   Your baby has a lower incidence of asthma, allergies, and sudden infant death syndrome.   The nutrients in breast milk are better for your baby than infant formulas and are designed uniquely for your baby's needs.   Breast milk improves your baby's brain development.   Your baby is less likely to develop other conditions, such as childhood obesity, asthma, or type 2 diabetes mellitus.  For You   Breastfeeding helps to create a very special bond between you and your baby.   Breastfeeding is convenient. Breast milk is always available at the correct temperature and costs nothing.   Breastfeeding helps to burn calories and helps you lose the weight gained during pregnancy.   Breastfeeding makes your uterus contract to its prepregnancy size faster and slows bleeding (lochia) after you give birth.   Breastfeeding helps to lower your risk of developing type 2 diabetes mellitus, osteoporosis, and breast or ovarian cancer later in life. SIGNS THAT YOUR BABY IS HUNGRY Early Signs of Hunger  Increased alertness or activity.  Stretching.  Movement of the head from  side to side.  Movement of the head and opening of the mouth when the corner of the mouth or cheek is stroked (rooting).  Increased sucking sounds, smacking lips, cooing, sighing, or squeaking.  Hand-to-mouth movements.  Increased sucking of fingers or hands. Late Signs of Hunger  Fussing.  Intermittent crying. Extreme Signs of Hunger Signs of extreme hunger will require calming and consoling before your baby will be able to breastfeed successfully. Do not wait for the following signs of extreme hunger to occur before you initiate breastfeeding:   Restlessness.  A loud, strong cry.   Screaming. BREASTFEEDING BASICS Breastfeeding Initiation  Find a comfortable place to sit or lie down, with your neck and back well supported.  Place a pillow or rolled up blanket under your baby to bring him or her to the level of your breast (if you are seated). Nursing pillows are specially designed to help support your arms and your baby while you breastfeed.  Make sure that your baby's abdomen is facing your abdomen.   Gently massage your breast. With your fingertips, massage from your chest wall toward your nipple in a circular motion. This encourages milk flow. You may need to continue this action during the feeding if your milk flows slowly.  Support your breast with 4 fingers underneath and your thumb above your nipple. Make sure your fingers are well away from your nipple and your baby's mouth.   Stroke your baby's lips gently with your finger or nipple.   When your baby's mouth is open wide enough, quickly bring your baby to your  breast, placing your entire nipple and as much of the colored area around your nipple (areola) as possible into your baby's mouth.   More areola should be visible above your baby's upper lip than below the lower lip.   Your baby's tongue should be between his or her lower gum and your breast.   Ensure that your baby's mouth is correctly positioned  around your nipple (latched). Your baby's lips should create a seal on your breast and be turned out (everted).  It is common for your baby to suck about 2-3 minutes in order to start the flow of breast milk. Latching Teaching your baby how to latch on to your breast properly is very important. An improper latch can cause nipple pain and decreased milk supply for you and poor weight gain in your baby. Also, if your baby is not latched onto your nipple properly, he or she may swallow some air during feeding. This can make your baby fussy. Burping your baby when you switch breasts during the feeding can help to get rid of the air. However, teaching your baby to latch on properly is still the best way to prevent fussiness from swallowing air while breastfeeding. Signs that your baby has successfully latched on to your nipple:    Silent tugging or silent sucking, without causing you pain.   Swallowing heard between every 3-4 sucks.    Muscle movement above and in front of his or her ears while sucking.  Signs that your baby has not successfully latched on to nipple:   Sucking sounds or smacking sounds from your baby while breastfeeding.  Nipple pain. If you think your baby has not latched on correctly, slip your finger into the corner of your baby's mouth to break the suction and place it between your baby's gums. Attempt breastfeeding initiation again. Signs of Successful Breastfeeding Signs from your baby:   A gradual decrease in the number of sucks or complete cessation of sucking.   Falling asleep.   Relaxation of his or her body.   Retention of a small amount of milk in his or her mouth.   Letting go of your breast by himself or herself. Signs from you:  Breasts that have increased in firmness, weight, and size 1-3 hours after feeding.   Breasts that are softer immediately after breastfeeding.  Increased milk volume, as well as a change in milk consistency and color by  the fifth day of breastfeeding.   Nipples that are not sore, cracked, or bleeding. Signs That Your Randel Books is Getting Enough Milk  Wetting at least 3 diapers in a 24-hour period. The urine should be clear and pale yellow by age 11 days.  At least 3 stools in a 24-hour period by age 11 days. The stool should be soft and yellow.  At least 3 stools in a 24-hour period by age 18 days. The stool should be seedy and yellow.  No loss of weight greater than 10% of birth weight during the first 55 days of age.  Average weight gain of 4-7 ounces (113-198 g) per week after age 36 days.  Consistent daily weight gain by age 9 days, without weight loss after the age of 2 weeks. After a feeding, your baby may spit up a small amount. This is common. BREASTFEEDING FREQUENCY AND DURATION Frequent feeding will help you make more milk and can prevent sore nipples and breast engorgement. Breastfeed when you feel the need to reduce the fullness of your breasts  or when your baby shows signs of hunger. This is called "breastfeeding on demand." Avoid introducing a pacifier to your baby while you are working to establish breastfeeding (the first 4-6 weeks after your baby is born). After this time you may choose to use a pacifier. Research has shown that pacifier use during the first year of a baby's life decreases the risk of sudden infant death syndrome (SIDS). Allow your baby to feed on each breast as long as he or she wants. Breastfeed until your baby is finished feeding. When your baby unlatches or falls asleep while feeding from the first breast, offer the second breast. Because newborns are often sleepy in the first few weeks of life, you may need to awaken your baby to get him or her to feed. Breastfeeding times will vary from baby to baby. However, the following rules can serve as a guide to help you ensure that your baby is properly fed:  Newborns (babies 5 weeks of age or younger) may breastfeed every 1-3  hours.  Newborns should not go longer than 3 hours during the day or 5 hours during the night without breastfeeding.  You should breastfeed your baby a minimum of 8 times in a 24-hour period until you begin to introduce solid foods to your baby at around 54 months of age. BREAST MILK PUMPING Pumping and storing breast milk allows you to ensure that your baby is exclusively fed your breast milk, even at times when you are unable to breastfeed. This is especially important if you are going back to work while you are still breastfeeding or when you are not able to be present during feedings. Your lactation consultant can give you guidelines on how long it is safe to store breast milk.  A breast pump is a machine that allows you to pump milk from your breast into a sterile bottle. The pumped breast milk can then be stored in a refrigerator or freezer. Some breast pumps are operated by hand, while others use electricity. Ask your lactation consultant which type will work best for you. Breast pumps can be purchased, but some hospitals and breastfeeding support groups lease breast pumps on a monthly basis. A lactation consultant can teach you how to hand express breast milk, if you prefer not to use a pump.  CARING FOR YOUR BREASTS WHILE YOU BREASTFEED Nipples can become dry, cracked, and sore while breastfeeding. The following recommendations can help keep your breasts moisturized and healthy:  Avoid using soap on your nipples.   Wear a supportive bra. Although not required, special nursing bras and tank tops are designed to allow access to your breasts for breastfeeding without taking off your entire bra or top. Avoid wearing underwire-style bras or extremely tight bras.  Air dry your nipples for 3-98minutes after each feeding.   Use only cotton bra pads to absorb leaked breast milk. Leaking of breast milk between feedings is normal.   Use lanolin on your nipples after breastfeeding. Lanolin helps to  maintain your skin's normal moisture barrier. If you use pure lanolin, you do not need to wash it off before feeding your baby again. Pure lanolin is not toxic to your baby. You may also hand express a few drops of breast milk and gently massage that milk into your nipples and allow the milk to air dry. In the first few weeks after giving birth, some women experience extremely full breasts (engorgement). Engorgement can make your breasts feel heavy, warm, and tender to the  touch. Engorgement peaks within 3-5 days after you give birth. The following recommendations can help ease engorgement:  Completely empty your breasts while breastfeeding or pumping. You may want to start by applying warm, moist heat (in the shower or with warm water-soaked hand towels) just before feeding or pumping. This increases circulation and helps the milk flow. If your baby does not completely empty your breasts while breastfeeding, pump any extra milk after he or she is finished.  Wear a snug bra (nursing or regular) or tank top for 1-2 days to signal your body to slightly decrease milk production.  Apply ice packs to your breasts, unless this is too uncomfortable for you.  Make sure that your baby is latched on and positioned properly while breastfeeding. If engorgement persists after 48 hours of following these recommendations, contact your health care provider or a lactation consultant. OVERALL HEALTH CARE RECOMMENDATIONS WHILE BREASTFEEDING  Eat healthy foods. Alternate between meals and snacks, eating 3 of each per day. Because what you eat affects your breast milk, some of the foods may make your baby more irritable than usual. Avoid eating these foods if you are sure that they are negatively affecting your baby.  Drink milk, fruit juice, and water to satisfy your thirst (about 10 glasses a day).   Rest often, relax, and continue to take your prenatal vitamins to prevent fatigue, stress, and anemia.  Continue  breast self-awareness checks.  Avoid chewing and smoking tobacco.  Avoid alcohol and drug use. Some medicines that may be harmful to your baby can pass through breast milk. It is important to ask your health care provider before taking any medicine, including all over-the-counter and prescription medicine as well as vitamin and herbal supplements. It is possible to become pregnant while breastfeeding. If birth control is desired, ask your health care provider about options that will be safe for your baby. SEEK MEDICAL CARE IF:   You feel like you want to stop breastfeeding or have become frustrated with breastfeeding.  You have painful breasts or nipples.  Your nipples are cracked or bleeding.  Your breasts are red, tender, or warm.  You have a swollen area on either breast.  You have a fever or chills.  You have nausea or vomiting.  You have drainage other than breast milk from your nipples.  Your breasts do not become full before feedings by the fifth day after you give birth.  You feel sad and depressed.  Your baby is too sleepy to eat well.  Your baby is having trouble sleeping.   Your baby is wetting less than 3 diapers in a 24-hour period.  Your baby has less than 3 stools in a 24-hour period.  Your baby's skin or the white part of his or her eyes becomes yellow.   Your baby is not gaining weight by 5 days of age. SEEK IMMEDIATE MEDICAL CARE IF:   Your baby is overly tired (lethargic) and does not want to wake up and feed.  Your baby develops an unexplained fever. Document Released: 12/22/2004 Document Revised: 12/27/2012 Document Reviewed: 06/15/2012 Exit[employer] Patient Information 2015 [employer]. This information is not intended to replace advice given to you by your health care provider. Make sure you discuss any questions you have with your health care provider.  

## 2013-11-22 NOTE — ED Notes (Signed)
Pt states her BP was elevated at her prenatal appointment this morning. They will continue to monitor it.  Dr. Lisbeth Renshaw notified of elevated BP here today.

## 2013-11-27 ENCOUNTER — Inpatient Hospital Stay (HOSPITAL_COMMUNITY)
Admission: AD | Admit: 2013-11-27 | Discharge: 2013-11-27 | Disposition: A | Discharge status: Home / Self Care | Payer: Medicaid Other | Source: Ambulatory Visit | Attending: Family Medicine | Admitting: Family Medicine

## 2013-11-27 ENCOUNTER — Encounter (HOSPITAL_COMMUNITY): Payer: Self-pay | Admitting: *Deleted

## 2013-11-27 DIAGNOSIS — R109 Unspecified abdominal pain: Secondary | ICD-10-CM | POA: Diagnosis not present

## 2013-11-27 DIAGNOSIS — Z3A31 31 weeks gestation of pregnancy: Secondary | ICD-10-CM | POA: Diagnosis not present

## 2013-11-27 DIAGNOSIS — Z87891 Personal history of nicotine dependence: Secondary | ICD-10-CM | POA: Insufficient documentation

## 2013-11-27 DIAGNOSIS — R1084 Generalized abdominal pain: Secondary | ICD-10-CM

## 2013-11-27 DIAGNOSIS — O26893 Other specified pregnancy related conditions, third trimester: Secondary | ICD-10-CM | POA: Insufficient documentation

## 2013-11-27 DIAGNOSIS — M549 Dorsalgia, unspecified: Secondary | ICD-10-CM | POA: Diagnosis not present

## 2013-11-27 LAB — URINALYSIS, ROUTINE W REFLEX MICROSCOPIC
BILIRUBIN URINE: NEGATIVE
Glucose, UA: NEGATIVE mg/dL
HGB URINE DIPSTICK: NEGATIVE
KETONES UR: NEGATIVE mg/dL
Leukocytes, UA: NEGATIVE
Nitrite: NEGATIVE
PROTEIN: NEGATIVE mg/dL
Specific Gravity, Urine: 1.02 (ref 1.005–1.030)
UROBILINOGEN UA: 0.2 mg/dL (ref 0.0–1.0)
pH: 7 (ref 5.0–8.0)

## 2013-11-27 NOTE — Discharge Instructions (Signed)

## 2013-11-27 NOTE — MAU Note (Signed)
Pt states here for lower abd pain and low back pain that wraps around to front. Denies uti s/s.

## 2013-11-27 NOTE — MAU Provider Note (Signed)
Chief Complaint:  Back Pain and Abdominal Pain   First Provider Initiated Contact with Patient 11/27/13 1413      HPI: Katrina Garcia is a 28 y.o. G1P0 at [redacted]w[redacted]d who presents to maternity admissions reporting pain in lower back and pelvic pain.  Pt notes over the past 2-3 days she has noted worsening low back pain. States that the pain will wrap around to the front and she feels occasional abdominal tightening with this.   Denies contractions, leakage of fluid or vaginal bleeding. Good fetal movement.   Pregnancy Course:  Fibroid Uterus PTSD MDD Fetal cerebral ventriculomegaly, normal TORCH  Past Medical History: Past Medical History  Diagnosis Date  . Fibroid   . HPV (human papilloma virus) infection   . MDD (major depressive disorder)   . PTSD (post-traumatic stress disorder)   . Fibroid uterus 07/29/2013    Left subserosal 3.5 x 3.1 x 2.5 Left Intramural 1.2 x 1.3 x 1.4      Past obstetric history: OB History  Gravida Para Term Preterm AB SAB TAB Ectopic Multiple Living  1             # Outcome Date GA Lbr Len/2nd Weight Sex Delivery Anes PTL Lv  1 Current               Past Surgical History: Past Surgical History  Procedure Laterality Date  . Wisdom tooth extraction       Family History: History reviewed. No pertinent family history.  Social History: History  Substance Use Topics  . Smoking status: Former Smoker    Types: Cigarettes    Quit date: 09/06/2011  . Smokeless tobacco: Never Used  . Alcohol Use: No    Allergies: No Known Allergies  Meds:  No prescriptions prior to admission    ROS: Pertinent findings in history of present illness.  Physical Exam  Blood pressure 125/63, pulse 75, temperature 98.2 F (36.8 C), temperature source Oral, resp. rate 18, height 5\' 1"  (1.549 m), weight 198 lb 8 oz (90.039 kg), last menstrual period 04/19/2013. GENERAL: Well-developed, well-nourished female in no acute distress.  HEENT: normocephalic HEART: normal  rate RESP: normal effort ABDOMEN: Soft, non-tender, gravid appropriate for gestational age EXTREMITIES: Nontender, no edema NEURO: alert and oriented SPECULUM EXAM: NEFG, physiologic discharge, no blood, cervix clean  SVE c/t/h.  FHT:  Baseline 135 , moderate variability, accelerations present, no decelerations Contractions:none   Labs: Results for orders placed or performed during the hospital encounter of 11/27/13 (from the past 24 hour(s))  Urinalysis, Routine w reflex microscopic     Status: None   Collection Time: 11/27/13 11:56 AM  Result Value Ref Range   Color, Urine YELLOW YELLOW   APPearance CLEAR CLEAR   Specific Gravity, Urine 1.020 1.005 - 1.030   pH 7.0 5.0 - 8.0   Glucose, UA NEGATIVE NEGATIVE mg/dL   Hgb urine dipstick NEGATIVE NEGATIVE   Bilirubin Urine NEGATIVE NEGATIVE   Ketones, ur NEGATIVE NEGATIVE mg/dL   Protein, ur NEGATIVE NEGATIVE mg/dL   Urobilinogen, UA 0.2 0.0 - 1.0 mg/dL   Nitrite NEGATIVE NEGATIVE   Leukocytes, UA NEGATIVE NEGATIVE    Imaging:  US Ob Follow Up  11/22/2013   OBSTETRICAL ULTRASOUND: This exam was performed within a [facility]. The OB US report was generated in the [facility] system, and faxed to the ordering physician.   This report is available in the [facility]. See the [facility] Obstetric US report via the Image Link.  MAU Course: Neg U/A SVE c/t/h SSE visually closed with normal physiologic discharge Upon admission with slightly elevated BP 139/80, but normalized on repeat. No s/sx pre-eclampsia. Denies HA, N/V, RUQ pain, scotoma  Assessment: 1. Generalized abdominal pain     Plan: Discharge home Not in active labor Likely round ligament pain vs progression of GA causing lower pelvic pressure Pre-eclampsia and labor precations Labor precautions and fetal kick counts     Follow-up Information    Follow up with Damien Fusi, NP.   Specialty:  Obstetrics and Gynecology   Why:  If symptoms worsen    Contact information:   [place] [address], [place] [address] [phone]        Medication List    TAKE these medications        prenatal multivitamin Tabs tablet  Take 1 tablet by mouth daily at 12 noon.        Josephine Cables, MD 11/28/2013 5:20 AM

## 2013-12-06 ENCOUNTER — Encounter: Payer: Self-pay | Admitting: Physician Assistant

## 2013-12-07 ENCOUNTER — Encounter: Payer: Self-pay | Admitting: Physician Assistant

## 2013-12-15 ENCOUNTER — Encounter (HOSPITAL_COMMUNITY): Payer: Self-pay | Admitting: *Deleted

## 2013-12-15 ENCOUNTER — Ambulatory Visit (INDEPENDENT_AMBULATORY_CARE_PROVIDER_SITE_OTHER): Payer: Medicaid Other | Admitting: Family Medicine

## 2013-12-15 ENCOUNTER — Inpatient Hospital Stay (HOSPITAL_COMMUNITY)
Admission: AD | Admit: 2013-12-15 | Discharge: 2013-12-22 | DRG: 765 | Disposition: A | Discharge status: Home / Self Care | Payer: Medicaid Other | Source: Ambulatory Visit | Attending: Family Medicine | Admitting: Family Medicine

## 2013-12-15 VITALS — BP 166/92 | HR 86 | Wt 206.5 lb

## 2013-12-15 DIAGNOSIS — O9832 Other infections with a predominantly sexual mode of transmission complicating childbirth: Secondary | ICD-10-CM | POA: Diagnosis present

## 2013-12-15 DIAGNOSIS — O163 Unspecified maternal hypertension, third trimester: Secondary | ICD-10-CM | POA: Diagnosis present

## 2013-12-15 DIAGNOSIS — F329 Major depressive disorder, single episode, unspecified: Secondary | ICD-10-CM | POA: Diagnosis present

## 2013-12-15 DIAGNOSIS — B977 Papillomavirus as the cause of diseases classified elsewhere: Secondary | ICD-10-CM | POA: Diagnosis present

## 2013-12-15 DIAGNOSIS — O99344 Other mental disorders complicating childbirth: Secondary | ICD-10-CM | POA: Diagnosis present

## 2013-12-15 DIAGNOSIS — O350XX1 Maternal care for (suspected) central nervous system malformation in fetus, fetus 1: Secondary | ICD-10-CM

## 2013-12-15 DIAGNOSIS — O133 Gestational [pregnancy-induced] hypertension without significant proteinuria, third trimester: Secondary | ICD-10-CM

## 2013-12-15 DIAGNOSIS — D259 Leiomyoma of uterus, unspecified: Secondary | ICD-10-CM | POA: Diagnosis present

## 2013-12-15 DIAGNOSIS — F4310 Post-traumatic stress disorder, unspecified: Secondary | ICD-10-CM | POA: Diagnosis present

## 2013-12-15 DIAGNOSIS — O99824 Streptococcus B carrier state complicating childbirth: Secondary | ICD-10-CM | POA: Diagnosis present

## 2013-12-15 DIAGNOSIS — A630 Anogenital (venereal) warts: Secondary | ICD-10-CM | POA: Diagnosis present

## 2013-12-15 DIAGNOSIS — O1413 Severe pre-eclampsia, third trimester: Secondary | ICD-10-CM | POA: Diagnosis present

## 2013-12-15 DIAGNOSIS — Z3493 Encounter for supervision of normal pregnancy, unspecified, third trimester: Secondary | ICD-10-CM

## 2013-12-15 DIAGNOSIS — O3509X1 Maternal care for (suspected) other central nervous system malformation or damage in fetus, fetus 1: Secondary | ICD-10-CM

## 2013-12-15 DIAGNOSIS — O26893 Other specified pregnancy related conditions, third trimester: Secondary | ICD-10-CM | POA: Diagnosis present

## 2013-12-15 DIAGNOSIS — Z3A34 34 weeks gestation of pregnancy: Secondary | ICD-10-CM | POA: Diagnosis present

## 2013-12-15 HISTORY — DX: Gestational (pregnancy-induced) hypertension without significant proteinuria, unspecified trimester: O13.9

## 2013-12-15 LAB — COMPREHENSIVE METABOLIC PANEL
ALT: 19 U/L (ref 0–35)
AST: 23 U/L (ref 0–37)
Albumin: 2.8 g/dL — ABNORMAL LOW (ref 3.5–5.2)
Alkaline Phosphatase: 138 U/L — ABNORMAL HIGH (ref 39–117)
Anion gap: 11 (ref 5–15)
BILIRUBIN TOTAL: 0.2 mg/dL — AB (ref 0.3–1.2)
BUN: 6 mg/dL (ref 6–23)
CO2: 23 mEq/L (ref 19–32)
Calcium: 8.6 mg/dL (ref 8.4–10.5)
Chloride: 100 mEq/L (ref 96–112)
Creatinine, Ser: 0.52 mg/dL (ref 0.50–1.10)
GFR calc Af Amer: >90 mL/min (ref >90)
GFR calc non Af Amer: >90 mL/min (ref >90)
Glucose, Bld: 86 mg/dL (ref 70–99)
POTASSIUM: 4.1 meq/L (ref 3.7–5.3)
Sodium: 134 mEq/L — ABNORMAL LOW (ref 137–147)
Total Protein: 6.3 g/dL (ref 6.0–8.3)

## 2013-12-15 LAB — RPR

## 2013-12-15 LAB — POCT URINALYSIS DIP (DEVICE)
Bilirubin Urine: NEGATIVE
Glucose, UA: NEGATIVE mg/dL
HGB URINE DIPSTICK: NEGATIVE
Ketones, ur: NEGATIVE mg/dL
Leukocytes, UA: NEGATIVE
Nitrite: NEGATIVE
PH: 7 (ref 5.0–8.0)
PROTEIN: NEGATIVE mg/dL
SPECIFIC GRAVITY, URINE: 1.02 (ref 1.005–1.030)
UROBILINOGEN UA: 0.2 mg/dL (ref 0.0–1.0)

## 2013-12-15 LAB — ABO/RH: ABO/RH(D): B POS

## 2013-12-15 LAB — CBC
HCT: 33.6 % — ABNORMAL LOW (ref 36.0–46.0)
Hemoglobin: 11.3 g/dL — ABNORMAL LOW (ref 12.0–15.0)
MCH: 27.4 pg (ref 26.0–34.0)
MCHC: 33.6 g/dL (ref 30.0–36.0)
MCV: 81.4 fL (ref 78.0–100.0)
Platelets: 242 10*3/uL (ref 150–400)
RBC: 4.13 MIL/uL (ref 3.87–5.11)
RDW: 13.2 % (ref 11.5–15.5)
WBC: 11.5 10*3/uL — AB (ref 4.0–10.5)

## 2013-12-15 LAB — GROUP B STREP BY PCR: GROUP B STREP BY PCR: POSITIVE — AB

## 2013-12-15 LAB — OB RESULTS CONSOLE GBS: GBS: POSITIVE

## 2013-12-15 LAB — PROTEIN / CREATININE RATIO, URINE
Creatinine, Urine: 57.33 mg/dL
Protein Creatinine Ratio: 0.2 — ABNORMAL HIGH (ref 0.00–0.15)
Total Protein, Urine: 11.2 mg/dL

## 2013-12-15 MED ORDER — CITRIC ACID-SODIUM CITRATE 334-500 MG/5ML PO SOLN
30.0000 mL | ORAL | Status: DC | PRN
  Administered 2013-12-18: 30 mL via ORAL
  Filled 2013-12-15: qty 15

## 2013-12-15 MED ORDER — LACTATED RINGERS IV SOLN
INTRAVENOUS | Status: DC
  Administered 2013-12-15 – 2013-12-18 (×6): via INTRAVENOUS

## 2013-12-15 MED ORDER — FLEET ENEMA 7-19 GM/118ML RE ENEM: 1.0000 | ENEMA | RECTAL | Status: DC | PRN

## 2013-12-15 MED ORDER — PENICILLIN G POTASSIUM 5000000 UNITS IJ SOLR
5.0000 10*6.[IU] | Freq: Once | INTRAVENOUS | Status: DC
  Filled 2013-12-15: qty 5

## 2013-12-15 MED ORDER — LACTATED RINGERS IV SOLN: 500.0000 mL | INTRAVENOUS | Status: DC | PRN

## 2013-12-15 MED ORDER — MAGNESIUM SULFATE BOLUS VIA INFUSION
4.0000 g | Freq: Once | INTRAVENOUS | Status: AC
  Administered 2013-12-15: 4 g via INTRAVENOUS
  Filled 2013-12-15: qty 500

## 2013-12-15 MED ORDER — OXYCODONE-ACETAMINOPHEN 5-325 MG PO TABS: 1.0000 | ORAL_TABLET | ORAL | Status: DC | PRN

## 2013-12-15 MED ORDER — ACETAMINOPHEN 325 MG PO TABS: 650.0000 mg | ORAL_TABLET | ORAL | Status: DC | PRN

## 2013-12-15 MED ORDER — LIDOCAINE HCL (PF) 1 % IJ SOLN: 30.0000 mL | INTRAMUSCULAR | Status: DC | PRN

## 2013-12-15 MED ORDER — PENICILLIN G POTASSIUM 5000000 UNITS IJ SOLR
2.5000 10*6.[IU] | INTRAVENOUS | Status: DC
  Filled 2013-12-15 (×4): qty 2.5

## 2013-12-15 MED ORDER — MAGNESIUM SULFATE 40 G IN LACTATED RINGERS - SIMPLE
2.0000 g/h | INTRAVENOUS | Status: DC
  Administered 2013-12-15: 1 g/h via INTRAVENOUS
  Administered 2013-12-16 – 2013-12-18 (×3): 2 g/h via INTRAVENOUS
  Filled 2013-12-15 (×5): qty 500

## 2013-12-15 MED ORDER — OXYCODONE-ACETAMINOPHEN 5-325 MG PO TABS: 2.0000 | ORAL_TABLET | ORAL | Status: DC | PRN

## 2013-12-15 MED ORDER — OXYTOCIN 40 UNITS IN LACTATED RINGERS INFUSION - SIMPLE MED: 62.5000 mL/h | INTRAVENOUS | Status: DC

## 2013-12-15 MED ORDER — OXYTOCIN BOLUS FROM INFUSION
500.0000 mL | INTRAVENOUS | Status: DC
Start: 2013-12-15 — End: 2013-12-18

## 2013-12-15 MED ORDER — TERBUTALINE SULFATE 1 MG/ML IJ SOLN: 0.2500 mg | Freq: Once | INTRAMUSCULAR | Status: AC | PRN

## 2013-12-15 MED ORDER — LABETALOL HCL 5 MG/ML IV SOLN
10.0000 mg | INTRAVENOUS | Status: DC | PRN
  Administered 2013-12-15 – 2013-12-16 (×6): 10 mg via INTRAVENOUS
  Filled 2013-12-15 (×4): qty 4

## 2013-12-15 MED ORDER — ZOLPIDEM TARTRATE 5 MG PO TABS
5.0000 mg | ORAL_TABLET | Freq: Every evening | ORAL | Status: DC | PRN
  Administered 2013-12-16: 5 mg via ORAL
  Filled 2013-12-15: qty 1

## 2013-12-15 MED ORDER — ONDANSETRON HCL 4 MG/2ML IJ SOLN
4.0000 mg | Freq: Four times a day (QID) | INTRAMUSCULAR | Status: DC | PRN
  Filled 2013-12-15: qty 2

## 2013-12-15 MED ORDER — MISOPROSTOL 25 MCG QUARTER TABLET
25.0000 ug | ORAL_TABLET | ORAL | Status: DC
  Administered 2013-12-15 – 2013-12-16 (×6): 25 ug via VAGINAL
  Filled 2013-12-15 (×6): qty 0.25

## 2013-12-15 NOTE — Progress Notes (Signed)
Pt reports swelling in her feet. No visual disturbances. She does report a headache a few days ago.

## 2013-12-15 NOTE — H&P (Signed)
Katrina Garcia is a 28 y.o. female presenting from clinic for gestational hypertension with severe features. Pt has had systolic readings all over 967, and diastolic readings in 893-810 range.  Pt was observed in the MAU and continued to have severe hypertension.  Pt reports she has been having elevated BP mostly in the 130/80 range for the last 3 weeks for which she has been monitored in the clinic.  She currently reports a mild headache and some mild leg/ankle swelling, but otherwise denies any changes in vision, abdominal pain, SOB, or CP.      Clinic [address] [facility]  Dating LMP verified by 8 week [place]  Genetic Screen 1 Screen: Neg AFP: Neg Quad: NIPS:  Anatomic [place] Normal female, incomplete  GTT Early: 125 Third trimester:   TDaP vaccine 11/01/13  Flu vaccine 09/13/13  GBS   Contraception NuvaRing  Baby Food Breast  Circumcision Boy - circ desired but undecided where  Pediatrician   Support Person FOB   Maternal Medical History:  Reason for admission: Nausea.  Fetal activity: Perceived fetal activity is normal.   Last perceived fetal movement was within the past hour.    Prenatal complications: PIH.   No bleeding, cholelithiasis, HIV, infection, IUGR, nephrolithiasis, oligohydramnios, placental abnormality, polyhydramnios, pre-eclampsia, preterm labor, substance abuse, thrombocytopenia or thrombophilia.   Prenatal Complications - Diabetes: none.    OB History    Gravida Para Term Preterm AB TAB SAB Ectopic Multiple Living   1              Past Medical History  Diagnosis Date  . Fibroid   . HPV (human papilloma virus) infection   . MDD (major depressive disorder)   . PTSD (post-traumatic stress disorder)   . Fibroid uterus 07/29/2013    Left subserosal 3.5 x 3.1 x 2.5 Left Intramural 1.2 x 1.3 x 1.4    . Pregnancy induced hypertension    Past Surgical History  Procedure Laterality Date  . Wisdom tooth extraction     Family History: family history is not on  file. Social History:  reports that she quit smoking about 2 years ago. Her smoking use included Cigarettes. She smoked 0.00 packs per day. She has never used smokeless tobacco. She reports that she does not drink alcohol or use illicit drugs.   Prenatal Transfer Tool  Maternal Diabetes: No Genetic Screening: Normal Maternal Ultrasounds/Referrals: Normal Fetal Ultrasounds or other Referrals:  None Maternal Substance Abuse:  No Significant Maternal Medications:  None Significant Maternal Lab Results:  None Other Comments:  None  Review of Systems  Constitutional: Positive for malaise/fatigue. Negative for fever and chills.  Eyes: Negative for blurred vision and double vision.  Respiratory: Negative for shortness of breath.   Cardiovascular: Positive for leg swelling. Negative for chest pain.  Gastrointestinal: Negative for nausea and vomiting.  Genitourinary: Negative for dysuria.  Musculoskeletal: Positive for back pain.  Neurological: Positive for headaches. Negative for dizziness.      Blood pressure 166/98, pulse 85, resp. rate 18, height 5\' 1"  (1.549 m), weight 94.065 kg (207 lb 6 oz), last menstrual period 04/19/2013. Maternal Exam:  Uterine Assessment: Contraction strength is mild.  Contraction frequency is irregular.   Introitus: not evaluated.   Ferning test: not done.  Nitrazine test: not done.  Cervix: not evaluated.   Physical Exam  Constitutional: She is oriented to person, place, and time. She appears well-developed and well-nourished. She is cooperative.  HENT:  Head: Normocephalic and atraumatic.  Eyes: Conjunctivae and EOM  are normal. Pupils are equal, round, and reactive to light.  Neck: Normal range of motion. Neck supple.  Cardiovascular: Normal rate and regular rhythm.   Respiratory: Effort normal. No respiratory distress.  GI: She exhibits no distension. There is no tenderness.  Genitourinary:  Patient admitted for gestational hypertension with  severe features and will be induced.  Cervical check will be done once patient arrives to the birthing suite.    Musculoskeletal: Normal range of motion. She exhibits edema.  Neurological: She is alert and oriented to person, place, and time. She has normal reflexes.  Skin: Skin is warm and dry.    Prenatal labs: ABO, Rh: B/POS/-- (07/15 1319) Antibody: NEG (07/15 1319) Rubella: 1.15 (07/15 1319) RPR: NON REAC (10/28 1127)  HBsAg: NEGATIVE (07/15 1319)  HIV: NONREACTIVE (10/28 1127)  GBS:     Assessment/Plan: Pt is a 28 y.o. G1P0 [redacted]w[redacted]d who presents with gestational hypertension with severe features.  Pt will be admitted for management of her gestational hypertension and pt will be induced with anticpation of NSVD.    1) Gestational HTN w/severe features:  -Labetelol 100mg  BID -Magnesium loading dose and maintenance per protocol -Fetal monitoring   2)Induction of Labor  -Pt will be admitted to L&D and induced per routine with cytotec/foleybulb/and/or pitocin -Continuous fetal monitoring  Remus Blake 12/15/2013, 12:19 PM

## 2013-12-15 NOTE — MAU Note (Signed)
Pt here from clinic downstairs for PIH eval. Denies h/a, dizziness, blurred vision. No contractions or pain.

## 2013-12-15 NOTE — Patient Instructions (Signed)
Preeclampsia and Eclampsia Preeclampsia is a serious condition that develops only during pregnancy. It is also called toxemia of pregnancy. This condition causes high blood pressure along with other symptoms, such as swelling and headaches. These may develop as the condition gets worse. Preeclampsia may occur 20 weeks or later into your pregnancy.  Diagnosing and treating preeclampsia early is very important. If not treated early, it can cause serious problems for you and your baby. One problem it can lead to is eclampsia, which is a condition that causes muscle jerking or shaking (convulsions) in the mother. Delivering your baby is the best treatment for preeclampsia or eclampsia.  RISK FACTORS The cause of preeclampsia is not known. You may be more likely to develop preeclampsia if you have certain risk factors. These include:   Being pregnant for the first time.  Having preeclampsia in a past pregnancy.  Having a family history of preeclampsia.  Having high blood pressure.  Being pregnant with twins or triplets.  Being 35 or older.  Being African American.  Having kidney disease or diabetes.  Having medical conditions such as lupus or blood diseases.  Being very overweight (obese). SIGNS AND SYMPTOMS  The earliest signs of preeclampsia are:  High blood pressure.  Increased protein in your urine. Your health care provider will check for this at every prenatal visit. Other symptoms that can develop include:   Severe headaches.  Sudden weight gain.  Swelling of your hands, face, legs, and feet.  Feeling sick to your stomach (nauseous) and throwing up (vomiting).  Vision problems (blurred or double vision).  Numbness in your face, arms, legs, and feet.  Dizziness.  Slurred speech.  Sensitivity to bright lights.  Abdominal pain. DIAGNOSIS  There are no screening tests for preeclampsia. Your health care provider will ask you about symptoms and check for signs of  preeclampsia during your prenatal visits. You may also have tests, including:  Urine testing.  Blood testing.  Checking your baby's heart rate.  Checking the health of your baby and your placenta using images created with sound waves (ultrasound). TREATMENT  You can work out the best treatment approach together with your health care provider. It is very important to keep all prenatal appointments. If you have an increased risk of preeclampsia, you may need more frequent prenatal exams.  Your health care provider may prescribe bed rest.  You may have to eat as little salt as possible.  You may need to take medicine to lower your blood pressure if the condition does not respond to more conservative measures.  You may need to stay in the hospital if your condition is severe. There, treatment will focus on controlling your blood pressure and fluid retention. You may also need to take medicine to prevent seizures.  If the condition gets worse, your baby may need to be delivered early to protect you and the baby. You may have your labor started with medicine (be induced), or you may have a cesarean delivery.  Preeclampsia usually goes away after the baby is born. HOME CARE INSTRUCTIONS   Only take over-the-counter or prescription medicines as directed by your health care provider.  Lie on your left side while resting. This keeps pressure off your baby.  Elevate your feet while resting.  Get regular exercise. Ask your health care provider what type of exercise is safe for you.  Avoid caffeine and alcohol.  Do not smoke.  Drink 6-8 glasses of water every day.  Eat a balanced diet   that is low in salt. Do not add salt to your food.  Avoid stressful situations as much as possible.  Get plenty of rest and sleep.  Keep all prenatal appointments and tests as scheduled. SEEK MEDICAL CARE IF:  You are gaining more weight than expected.  You have any headaches, abdominal pain, or  nausea.  You are bruising more than usual.  You feel dizzy or light-headed. SEEK IMMEDIATE MEDICAL CARE IF:   You develop sudden or severe swelling anywhere in your body. This usually happens in the legs.  You gain 5 lb (2.3 kg) or more in a week.  You have a severe headache, dizziness, problems with your vision, or confusion.  You have severe abdominal pain.  You have lasting nausea or vomiting.  You have a seizure.  You have trouble moving any part of your body.  You develop numbness in your body.  You have trouble speaking.  You have any abnormal bleeding.  You develop a stiff neck.  You pass out. MAKE SURE YOU:   Understand these instructions.  Will watch your condition.  Will get help right away if you are not doing well or get worse. Document Released: 12/20/1999 Document Revised: 12/27/2012 Document Reviewed: 10/14/2012 Exit[employer] Patient Information 2015 [employer]. This information is not intended to replace advice given to you by your health care provider. Make sure you discuss any questions you have with your health care provider.  Breastfeeding Deciding to breastfeed is one of the best choices you can make for you and your baby. A change in hormones during pregnancy causes your breast tissue to grow and increases the number and size of your milk ducts. These hormones also allow proteins, sugars, and fats from your blood supply to make breast milk in your milk-producing glands. Hormones prevent breast milk from being released before your baby is born as well as prompt milk flow after birth. Once breastfeeding has begun, thoughts of your baby, as well as his or her sucking or crying, can stimulate the release of milk from your milk-producing glands.  BENEFITS OF BREASTFEEDING For Your Baby  Your first milk (colostrum) helps your baby's digestive system function better.   There are antibodies in your milk that help your baby fight off infections.   Your  baby has a lower incidence of asthma, allergies, and sudden infant death syndrome.   The nutrients in breast milk are better for your baby than infant formulas and are designed uniquely for your baby's needs.   Breast milk improves your baby's brain development.   Your baby is less likely to develop other conditions, such as childhood obesity, asthma, or type 2 diabetes mellitus.  For You   Breastfeeding helps to create a very special bond between you and your baby.   Breastfeeding is convenient. Breast milk is always available at the correct temperature and costs nothing.   Breastfeeding helps to burn calories and helps you lose the weight gained during pregnancy.   Breastfeeding makes your uterus contract to its prepregnancy size faster and slows bleeding (lochia) after you give birth.   Breastfeeding helps to lower your risk of developing type 2 diabetes mellitus, osteoporosis, and breast or ovarian cancer later in life. SIGNS THAT YOUR BABY IS HUNGRY Early Signs of Hunger  Increased alertness or activity.  Stretching.  Movement of the head from side to side.  Movement of the head and opening of the mouth when the corner of the mouth or cheek is stroked (rooting).    Increased sucking sounds, smacking lips, cooing, sighing, or squeaking.  Hand-to-mouth movements.  Increased sucking of fingers or hands. Late Signs of Hunger  Fussing.  Intermittent crying. Extreme Signs of Hunger Signs of extreme hunger will require calming and consoling before your baby will be able to breastfeed successfully. Do not wait for the following signs of extreme hunger to occur before you initiate breastfeeding:   Restlessness.  A loud, strong cry.   Screaming. BREASTFEEDING BASICS Breastfeeding Initiation  Find a comfortable place to sit or lie down, with your neck and back well supported.  Place a pillow or rolled up blanket under your baby to bring him or her to the level of  your breast (if you are seated). Nursing pillows are specially designed to help support your arms and your baby while you breastfeed.  Make sure that your baby's abdomen is facing your abdomen.   Gently massage your breast. With your fingertips, massage from your chest wall toward your nipple in a circular motion. This encourages milk flow. You may need to continue this action during the feeding if your milk flows slowly.  Support your breast with 4 fingers underneath and your thumb above your nipple. Make sure your fingers are well away from your nipple and your baby's mouth.   Stroke your baby's lips gently with your finger or nipple.   When your baby's mouth is open wide enough, quickly bring your baby to your breast, placing your entire nipple and as much of the colored area around your nipple (areola) as possible into your baby's mouth.   More areola should be visible above your baby's upper lip than below the lower lip.   Your baby's tongue should be between his or her lower gum and your breast.   Ensure that your baby's mouth is correctly positioned around your nipple (latched). Your baby's lips should create a seal on your breast and be turned out (everted).  It is common for your baby to suck about 2-3 minutes in order to start the flow of breast milk. Latching Teaching your baby how to latch on to your breast properly is very important. An improper latch can cause nipple pain and decreased milk supply for you and poor weight gain in your baby. Also, if your baby is not latched onto your nipple properly, he or she may swallow some air during feeding. This can make your baby fussy. Burping your baby when you switch breasts during the feeding can help to get rid of the air. However, teaching your baby to latch on properly is still the best way to prevent fussiness from swallowing air while breastfeeding. Signs that your baby has successfully latched on to your nipple:    Silent  tugging or silent sucking, without causing you pain.   Swallowing heard between every 3-4 sucks.    Muscle movement above and in front of his or her ears while sucking.  Signs that your baby has not successfully latched on to nipple:   Sucking sounds or smacking sounds from your baby while breastfeeding.  Nipple pain. If you think your baby has not latched on correctly, slip your finger into the corner of your baby's mouth to break the suction and place it between your baby's gums. Attempt breastfeeding initiation again. Signs of Successful Breastfeeding Signs from your baby:   A gradual decrease in the number of sucks or complete cessation of sucking.   Falling asleep.   Relaxation of his or her body.     Retention of a small amount of milk in his or her mouth.   Letting go of your breast by himself or herself. Signs from you:  Breasts that have increased in firmness, weight, and size 1-3 hours after feeding.   Breasts that are softer immediately after breastfeeding.  Increased milk volume, as well as a change in milk consistency and color by the fifth day of breastfeeding.   Nipples that are not sore, cracked, or bleeding. Signs That Your Baby is Getting Enough Milk  Wetting at least 3 diapers in a 24-hour period. The urine should be clear and pale yellow by age 5 days.  At least 3 stools in a 24-hour period by age 5 days. The stool should be soft and yellow.  At least 3 stools in a 24-hour period by age 7 days. The stool should be seedy and yellow.  No loss of weight greater than 10% of birth weight during the first 3 days of age.  Average weight gain of 4-7 ounces (113-198 g) per week after age 4 days.  Consistent daily weight gain by age 5 days, without weight loss after the age of 2 weeks. After a feeding, your baby may spit up a small amount. This is common. BREASTFEEDING FREQUENCY AND DURATION Frequent feeding will help you make more milk and can prevent  sore nipples and breast engorgement. Breastfeed when you feel the need to reduce the fullness of your breasts or when your baby shows signs of hunger. This is called "breastfeeding on demand." Avoid introducing a pacifier to your baby while you are working to establish breastfeeding (the first 4-6 weeks after your baby is born). After this time you may choose to use a pacifier. Research has shown that pacifier use during the first year of a baby's life decreases the risk of sudden infant death syndrome (SIDS). Allow your baby to feed on each breast as long as he or she wants. Breastfeed until your baby is finished feeding. When your baby unlatches or falls asleep while feeding from the first breast, offer the second breast. Because newborns are often sleepy in the first few weeks of life, you may need to awaken your baby to get him or her to feed. Breastfeeding times will vary from baby to baby. However, the following rules can serve as a guide to help you ensure that your baby is properly fed:  Newborns (babies 4 weeks of age or younger) may breastfeed every 1-3 hours.  Newborns should not go longer than 3 hours during the day or 5 hours during the night without breastfeeding.  You should breastfeed your baby a minimum of 8 times in a 24-hour period until you begin to introduce solid foods to your baby at around 6 months of age. BREAST MILK PUMPING Pumping and storing breast milk allows you to ensure that your baby is exclusively fed your breast milk, even at times when you are unable to breastfeed. This is especially important if you are going back to work while you are still breastfeeding or when you are not able to be present during feedings. Your lactation consultant can give you guidelines on how long it is safe to store breast milk.  A breast pump is a machine that allows you to pump milk from your breast into a sterile bottle. The pumped breast milk can then be stored in a refrigerator or freezer.  Some breast pumps are operated by hand, while others use electricity. Ask your lactation consultant which type   will work best for you. Breast pumps can be purchased, but some hospitals and breastfeeding support groups lease breast pumps on a monthly basis. A lactation consultant can teach you how to hand express breast milk, if you prefer not to use a pump.  CARING FOR YOUR BREASTS WHILE YOU BREASTFEED Nipples can become dry, cracked, and sore while breastfeeding. The following recommendations can help keep your breasts moisturized and healthy:  Avoid using soap on your nipples.   Wear a supportive bra. Although not required, special nursing bras and tank tops are designed to allow access to your breasts for breastfeeding without taking off your entire bra or top. Avoid wearing underwire-style bras or extremely tight bras.  Air dry your nipples for 3-4minutes after each feeding.   Use only cotton bra pads to absorb leaked breast milk. Leaking of breast milk between feedings is normal.   Use lanolin on your nipples after breastfeeding. Lanolin helps to maintain your skin's normal moisture barrier. If you use pure lanolin, you do not need to wash it off before feeding your baby again. Pure lanolin is not toxic to your baby. You may also hand express a few drops of breast milk and gently massage that milk into your nipples and allow the milk to air dry. In the first few weeks after giving birth, some women experience extremely full breasts (engorgement). Engorgement can make your breasts feel heavy, warm, and tender to the touch. Engorgement peaks within 3-5 days after you give birth. The following recommendations can help ease engorgement:  Completely empty your breasts while breastfeeding or pumping. You may want to start by applying warm, moist heat (in the shower or with warm water-soaked hand towels) just before feeding or pumping. This increases circulation and helps the milk flow. If your baby  does not completely empty your breasts while breastfeeding, pump any extra milk after he or she is finished.  Wear a snug bra (nursing or regular) or tank top for 1-2 days to signal your body to slightly decrease milk production.  Apply ice packs to your breasts, unless this is too uncomfortable for you.  Make sure that your baby is latched on and positioned properly while breastfeeding. If engorgement persists after 48 hours of following these recommendations, contact your health care provider or a lactation consultant. OVERALL HEALTH CARE RECOMMENDATIONS WHILE BREASTFEEDING  Eat healthy foods. Alternate between meals and snacks, eating 3 of each per day. Because what you eat affects your breast milk, some of the foods may make your baby more irritable than usual. Avoid eating these foods if you are sure that they are negatively affecting your baby.  Drink milk, fruit juice, and water to satisfy your thirst (about 10 glasses a day).   Rest often, relax, and continue to take your prenatal vitamins to prevent fatigue, stress, and anemia.  Continue breast self-awareness checks.  Avoid chewing and smoking tobacco.  Avoid alcohol and drug use. Some medicines that may be harmful to your baby can pass through breast milk. It is important to ask your health care provider before taking any medicine, including all over-the-counter and prescription medicine as well as vitamin and herbal supplements. It is possible to become pregnant while breastfeeding. If birth control is desired, ask your health care provider about options that will be safe for your baby. SEEK MEDICAL CARE IF:   You feel like you want to stop breastfeeding or have become frustrated with breastfeeding.  You have painful breasts or nipples.  Your   nipples are cracked or bleeding.  Your breasts are red, tender, or warm.  You have a swollen area on either breast.  You have a fever or chills.  You have nausea or  vomiting.  You have drainage other than breast milk from your nipples.  Your breasts do not become full before feedings by the fifth day after you give birth.  You feel sad and depressed.  Your baby is too sleepy to eat well.  Your baby is having trouble sleeping.   Your baby is wetting less than 3 diapers in a 24-hour period.  Your baby has less than 3 stools in a 24-hour period.  Your baby's skin or the white part of his or her eyes becomes yellow.   Your baby is not gaining weight by 5 days of age. SEEK IMMEDIATE MEDICAL CARE IF:   Your baby is overly tired (lethargic) and does not want to wake up and feed.  Your baby develops an unexplained fever. Document Released: 12/22/2004 Document Revised: 12/27/2012 Document Reviewed: 06/15/2012 [employer] Patient Information 2015 [employer]. This information is not intended to replace advice given to you by your health care provider. Make sure you discuss any questions you have with your health care provider.  

## 2013-12-15 NOTE — Plan of Care (Signed)
Problem: Phase I Progression Outcomes Goal: Assess per MD/Nurse,Routine-VS,FHR,UC,Head to Toe assess Outcome: Progressing Pt instructed on s/s of pre-eclampsia and expressed understanding of magnesium sulfate.

## 2013-12-15 NOTE — Progress Notes (Signed)
Markedly elevated BP No proteinuria To MAU for further evaluation--MAU and Dr. Elly Modena aware.

## 2013-12-16 LAB — CBC
HCT: 33.4 % — ABNORMAL LOW (ref 36.0–46.0)
HEMATOCRIT: 32.8 % — AB (ref 36.0–46.0)
HEMOGLOBIN: 11.2 g/dL — AB (ref 12.0–15.0)
Hemoglobin: 11.2 g/dL — ABNORMAL LOW (ref 12.0–15.0)
MCH: 27.8 pg (ref 26.0–34.0)
MCH: 27.3 pg (ref 26.0–34.0)
MCHC: 34.1 g/dL (ref 30.0–36.0)
MCHC: 33.5 g/dL (ref 30.0–36.0)
MCV: 81.4 fL (ref 78.0–100.0)
MCV: 81.3 fL (ref 78.0–100.0)
PLATELETS: 251 10*3/uL (ref 150–400)
Platelets: 273 10*3/uL (ref 150–400)
RBC: 4.03 MIL/uL (ref 3.87–5.11)
RBC: 4.11 MIL/uL (ref 3.87–5.11)
RDW: 13.3 % (ref 11.5–15.5)
RDW: 13.4 % (ref 11.5–15.5)
WBC: 12.1 10*3/uL — ABNORMAL HIGH (ref 4.0–10.5)
WBC: 13.8 10*3/uL — ABNORMAL HIGH (ref 4.0–10.5)

## 2013-12-16 LAB — COMPREHENSIVE METABOLIC PANEL
ALBUMIN: 2.7 g/dL — AB (ref 3.5–5.2)
ALBUMIN: 2.7 g/dL — AB (ref 3.5–5.2)
ALK PHOS: 145 U/L — AB (ref 39–117)
ALT: 18 U/L (ref 0–35)
ALT: 19 U/L (ref 0–35)
AST: 19 U/L (ref 0–37)
AST: 18 U/L (ref 0–37)
Alkaline Phosphatase: 147 U/L — ABNORMAL HIGH (ref 39–117)
Anion gap: 10 (ref 5–15)
Anion gap: 10 (ref 5–15)
BUN: 6 mg/dL (ref 6–23)
BUN: 6 mg/dL (ref 6–23)
CALCIUM: 7.7 mg/dL — AB (ref 8.4–10.5)
CO2: 23 mEq/L (ref 19–32)
CO2: 24 mEq/L (ref 19–32)
Calcium: 7.6 mg/dL — ABNORMAL LOW (ref 8.4–10.5)
Chloride: 103 mEq/L (ref 96–112)
Chloride: 103 mEq/L (ref 96–112)
Creatinine, Ser: 0.53 mg/dL (ref 0.50–1.10)
Creatinine, Ser: 0.56 mg/dL (ref 0.50–1.10)
GFR calc non Af Amer: >90 mL/min (ref >90)
GFR calc non Af Amer: >90 mL/min (ref >90)
GLUCOSE: 134 mg/dL — AB (ref 70–99)
GLUCOSE: 103 mg/dL — AB (ref 70–99)
POTASSIUM: 4.2 meq/L (ref 3.7–5.3)
Potassium: 4.4 mEq/L (ref 3.7–5.3)
SODIUM: 137 meq/L (ref 137–147)
Sodium: 136 mEq/L — ABNORMAL LOW (ref 137–147)
Total Bilirubin: <0.2 mg/dL — ABNORMAL LOW (ref 0.3–1.2)
Total Bilirubin: <0.2 mg/dL — ABNORMAL LOW (ref 0.3–1.2)
Total Protein: 6.1 g/dL (ref 6.0–8.3)
Total Protein: 6 g/dL (ref 6.0–8.3)

## 2013-12-16 LAB — PROTEIN / CREATININE RATIO, URINE
Creatinine, Urine: 106.46 mg/dL
Protein Creatinine Ratio: 0.15 (ref 0.00–0.15)
Total Protein, Urine: 16.5 mg/dL

## 2013-12-16 MED ORDER — MISOPROSTOL 25 MCG QUARTER TABLET: 50.0000 ug | ORAL_TABLET | ORAL | Status: DC

## 2013-12-16 MED ORDER — HYDRALAZINE HCL 20 MG/ML IJ SOLN
5.0000 mg | Freq: Once | INTRAMUSCULAR | Status: AC
  Administered 2013-12-16: 5 mg via INTRAVENOUS
  Filled 2013-12-16: qty 1

## 2013-12-16 MED ORDER — MISOPROSTOL 50MCG HALF TABLET
50.0000 ug | ORAL_TABLET | ORAL | Status: DC
  Administered 2013-12-16 – 2013-12-17 (×2): 50 ug via ORAL
  Filled 2013-12-16 (×11): qty 1

## 2013-12-16 MED ORDER — HYDRALAZINE HCL 20 MG/ML IJ SOLN
5.0000 mg | INTRAMUSCULAR | Status: DC | PRN
  Administered 2013-12-16: 5 mg via INTRAVENOUS
  Filled 2013-12-16 (×2): qty 1

## 2013-12-16 MED ORDER — FENTANYL CITRATE 0.05 MG/ML IJ SOLN
INTRAMUSCULAR | Status: AC
  Administered 2013-12-16: 50 ug via INTRAVENOUS
  Filled 2013-12-16: qty 2

## 2013-12-16 MED ORDER — LABETALOL HCL 5 MG/ML IV SOLN
0.5000 mg/min | INTRAVENOUS | Status: DC
  Filled 2013-12-16: qty 100

## 2013-12-16 MED ORDER — DEXTROSE 5 % IV SOLN
0.5000 mg/min | INTRAVENOUS | Status: DC
  Administered 2013-12-16: 0.5 mg/min via INTRAVENOUS
  Administered 2013-12-17: 1 mg/min via INTRAVENOUS
  Administered 2013-12-17: 0.5 mg/min via INTRAVENOUS
  Administered 2013-12-17: 1 mg/min via INTRAVENOUS
  Administered 2013-12-18 (×2): 1.5 mg/min via INTRAVENOUS
  Administered 2013-12-18: 2 mg/min via INTRAVENOUS
  Filled 2013-12-16 (×7): qty 100

## 2013-12-16 MED ORDER — PENICILLIN G POTASSIUM 5000000 UNITS IJ SOLR
5.0000 10*6.[IU] | Freq: Once | INTRAVENOUS | Status: AC
  Administered 2013-12-17: 5 10*6.[IU] via INTRAVENOUS
  Filled 2013-12-16: qty 5

## 2013-12-16 MED ORDER — DEXTROSE 5 % IV SOLN
2.5000 10*6.[IU] | INTRAVENOUS | Status: DC
  Administered 2013-12-18 (×5): 2.5 10*6.[IU] via INTRAVENOUS
  Filled 2013-12-16 (×20): qty 2.5

## 2013-12-16 MED ORDER — FENTANYL CITRATE 0.05 MG/ML IJ SOLN
50.0000 ug | INTRAMUSCULAR | Status: DC | PRN
  Administered 2013-12-16 – 2013-12-18 (×6): 50 ug via INTRAVENOUS
  Filled 2013-12-16 (×5): qty 2

## 2013-12-16 NOTE — Progress Notes (Signed)
Monitor resume.

## 2013-12-16 NOTE — Progress Notes (Addendum)
   12/16/13 1849  Vital Signs  BP (!) 158/95 mmHg  Pulse Rate 91  Labetalol gtt started at 0.5 mg/min. No bolus given. AICU will monitor telemetry remotely while pt is on gtt.

## 2013-12-16 NOTE — Progress Notes (Signed)
Katrina Garcia is a 28 y.o. G1P0 at [redacted]w[redacted]d by ultrasound admitted for induction of labor due to gestational hypertension with severe features.  Subjective: Pt denies any headache, abdominal pain, RUQ tenderness, blurred vision, still complains of upper and lower extremity edema.   Objective: BP 170/95 mmHg  Pulse 99  Temp(Src) 98.5 F (36.9 C) (Oral)  Resp 20  Ht 5\' 1"  (1.549 m)  Wt 94.065 kg (207 lb 6 oz)  BMI 39.20 kg/m2  LMP 04/19/2013 I/O last 3 completed shifts: In: 3871.7 [P.O.:1480; I.V.:2391.7] Out: 2125 [Urine:2125] Total I/O In: 5361 [P.O.:480; I.V.:1375] Out: 4431 [Urine:1375]  FHT:  FHR: 145 bpm, variability: moderate,  accelerations:  Present,  decelerations:  Absent UC:   irregular, every 6 minutes SVE:   Dilation: Closed Effacement (%): 40 Station: -3 Exam by:: Franchot Erichsen, RN  Labs: Lab Results  Component Value Date   WBC 13.8* 12/16/2013   HGB 11.2* 12/16/2013   HCT 33.4* 12/16/2013   MCV 81.3 12/16/2013   PLT 251 12/16/2013    Assessment / Plan: 1) Induction of labor: continue Cytotec per protocol  2) Severe HTN: Labetelol drip, on telemetry   Labor: Pt still has no s/sx of labor, continue cytotec Preeclampsia:  on magnesium sulfate, no signs or symptoms of toxicity, intake and ouput balanced and labs stable Fetal Wellbeing:  Category I Pain Control:  Labor support without medications I/D:  n/a Anticipated MOD:  NSVD  Remus Blake, MD 12/16/2013, 6:26 PM

## 2013-12-16 NOTE — Progress Notes (Signed)
Grandville Silos, MD, notified of BP's. Orders received for an additional 5 mg hydralazine IV, CBC, CMET and protein/creatinine ratio.

## 2013-12-16 NOTE — Progress Notes (Signed)
Pt to be transferred from room 168 to room 172 in order to be placed on telemetry for labetalol drip per MD's.

## 2013-12-16 NOTE — Progress Notes (Signed)
Jimmye Norman, CNM, in department. Parameters for giving IV Hydralazine are >160/105.

## 2013-12-16 NOTE — Plan of Care (Signed)
Problem: Phase I Progression Outcomes Goal: Assess per MD/Nurse,Routine-VS,FHR,UC,Head to Toe assess Outcome: Completed/Met Date Met:  12/16/13 Pt informed of the signs and symptoms of high blood pressure in pregnancy and to report any changes in symptoms  Problem: Phase II Progression Outcomes Goal: Adjust medications prn Outcome: Progressing Pt informed of various medications for hypertension and updated on changes in the plan of care to control HTN

## 2013-12-16 NOTE — Progress Notes (Signed)
Pt transferred to room 172. All pt belongings packed up and moved to room

## 2013-12-16 NOTE — Progress Notes (Signed)
Patient ID: Katrina Garcia, female   DOB: January 21, 1985, 28 y.o.   MRN: [id number]  CTSP for cytotec placement as she was contracting too regularly for RN to place. Pt not really feeling them. Mag sulfate infusing.  BP 157/100, 151/89 FHR 150s, +accels, no decels Ctx q 2-3 mins Cx C/L  IUP@ 34.3wks Severe Pre-e Cx unfavorable  Cytotec 87mcg placed PV  Katrina Garcia 12/16/2013 1:09 AM

## 2013-12-16 NOTE — Progress Notes (Signed)
Katrina Garcia is a 28 y.o. G1P0 at [redacted]w[redacted]d by ultrasound admitted for induction of labor due to gestational hypertension with severe features.  Subjective: Patient denies any HA, RUQ pain, vision changes.  Reports LE edema is slightly improved.  Objective: BP 141/77 mmHg  Pulse 83  Temp(Src) 97.8 F (36.6 C) (Oral)  Resp 20  Ht 5\' 1"  (1.549 m)  Wt 94.065 kg (207 lb 6 oz)  BMI 39.20 kg/m2  LMP 04/19/2013 I/O last 3 completed shifts: In: 5853.2 [P.O.:1960; I.V.:3893.2] Out: 3625 [Urine:3625] Total I/O In: 316.9 [P.O.:60; I.V.:256.9] Out: 150 [Urine:150]  FHT:  FHR: 140 bpm, variability: moderate,  accelerations:  Present,  decelerations:  Absent UC:   irregular SVE:   Dilation: 1 Effacement (%): 40 Station: -3 Exam by:: Zilphia Kozinski MD  Labs: Lab Results  Component Value Date   WBC 13.8* 12/16/2013   HGB 11.2* 12/16/2013   HCT 33.4* 12/16/2013   MCV 81.3 12/16/2013   PLT 251 12/16/2013    Assessment / Plan: Induction of labor 2/2 gHTN with severe features, on cytotec. Attempted placement of FB unsuccessfully.  Labor: Progressing normally Preeclampsia:  on magnesium sulfate, no signs or symptoms of toxicity, intake and ouput balanced and labs stable Fetal Wellbeing:  Category I Pain Control:  Fentanyl I/D:  GBS pos - PCN when in labor Anticipated MOD:  NSVD  Katrina Garcia 12/16/2013, 10:08 PM

## 2013-12-16 NOTE — Progress Notes (Signed)
Provider in room.  Reviews strip and uc pattern.  SVE done, provider places cytotec due to contractions being too close for RN to place.

## 2013-12-16 NOTE — Progress Notes (Addendum)
Patient ID: Katrina Garcia, female   DOB: 07/14/1985, 28 y.o.   MRN: [id number]  Feels ok, denies headache or  visiual changes  Filed Vitals:   12/16/13 1035 12/16/13 1039 12/16/13 1050 12/16/13 1059  BP: 159/91 169/113 155/98 155/95  Pulse: 87 94 88 88  Temp:    98.2 F (36.8 C)  TempSrc:    Axillary  Resp:      Height:      Weight:       Has required 2 doses of Labetalol  FHR reassuring, but nonreactive at times UCs every 2-5 min  HR RRR, no murmur Lungs CTAB  Dilation: Closed (cytotec placed) Effacement (%): 40 Cervical Position: Posterior Station: -3 Presentation: Vertex Exam by:: Jimmye Norman, CNM  Will continue plan of care Discussed with Dr Kennon Rounds. Will try adding Apresoline IV PRN in 5mg  doses for BP control.

## 2013-12-16 NOTE — Consult Note (Signed)
  Asked by Dr Kennon Rounds to speak to Ms Winiecki to discuss expectations of preterm pregnancy at 105 3/7 weeks. Chart reviewed. She was admitted for IOL for severe PIH. She is on Magnesium sulfate, Labetalol, and hydralazine. Other complications during pregnancy include: GBS colonization, currently on  Pen G.   I spoke to Ms Shimer with the FOB in her room. I discussed the typical clinical course for a 34 wk preterm.  I discussed common morbidities associated with this gestation such as temp instability  and immaturity of suck.  I also discussed possibility of respiratory issues and effects of magnesium sulfate on resp and GI.  I discussed possible need for temp support, IVF, gavage feeding, and LOS. I also discussed breast feeding and benefits especially to a preterm baby. Ms Pates plans to breastfeed.  I answered their questions to their satisfaction.   Thank you for this consult.  I spent 20 minutes meeting with Ms Kohlbeck and her partner.   Tommie Sams, MD  Neonatologist

## 2013-12-16 NOTE — Progress Notes (Signed)
See MAR for IV labetalol given for increased b/p per protocol.  Will recheck in 72m.

## 2013-12-16 NOTE — Progress Notes (Signed)
Pt in rest room, off monitor.

## 2013-12-17 LAB — CBC
HEMATOCRIT: 31.8 % — AB (ref 36.0–46.0)
HEMOGLOBIN: 10.7 g/dL — AB (ref 12.0–15.0)
MCH: 27.2 pg (ref 26.0–34.0)
MCHC: 33.6 g/dL (ref 30.0–36.0)
MCV: 80.7 fL (ref 78.0–100.0)
Platelets: 246 10*3/uL (ref 150–400)
RBC: 3.94 MIL/uL (ref 3.87–5.11)
RDW: 13.3 % (ref 11.5–15.5)
WBC: 13.9 10*3/uL — ABNORMAL HIGH (ref 4.0–10.5)

## 2013-12-17 LAB — COMPREHENSIVE METABOLIC PANEL
ALK PHOS: 138 U/L — AB (ref 39–117)
ALT: 17 U/L (ref 0–35)
ANION GAP: 11 (ref 5–15)
AST: 18 U/L (ref 0–37)
Albumin: 2.6 g/dL — ABNORMAL LOW (ref 3.5–5.2)
BILIRUBIN TOTAL: 0.3 mg/dL (ref 0.3–1.2)
BUN: 5 mg/dL — AB (ref 6–23)
CHLORIDE: 103 meq/L (ref 96–112)
CO2: 21 mEq/L (ref 19–32)
Calcium: 7.7 mg/dL — ABNORMAL LOW (ref 8.4–10.5)
Creatinine, Ser: 0.5 mg/dL (ref 0.50–1.10)
GFR calc non Af Amer: >90 mL/min (ref >90)
GLUCOSE: 117 mg/dL — AB (ref 70–99)
POTASSIUM: 3.9 meq/L (ref 3.7–5.3)
Sodium: 135 mEq/L — ABNORMAL LOW (ref 137–147)
Total Protein: 6.2 g/dL (ref 6.0–8.3)

## 2013-12-17 LAB — PREPARE RBC (CROSSMATCH)

## 2013-12-17 MED ORDER — MISOPROSTOL 50MCG HALF TABLET
50.0000 ug | ORAL_TABLET | ORAL | Status: DC
  Filled 2013-12-17 (×11): qty 1

## 2013-12-17 MED ORDER — OXYTOCIN 40 UNITS IN LACTATED RINGERS INFUSION - SIMPLE MED
1.0000 m[IU]/min | INTRAVENOUS | Status: DC
  Administered 2013-12-17: 2 m[IU]/min via INTRAVENOUS
  Filled 2013-12-17 (×2): qty 1000

## 2013-12-17 MED ORDER — PHENYLEPHRINE 40 MCG/ML (10ML) SYRINGE FOR IV PUSH (FOR BLOOD PRESSURE SUPPORT)
80.0000 ug | PREFILLED_SYRINGE | INTRAVENOUS | Status: DC | PRN
  Filled 2013-12-17: qty 10

## 2013-12-17 MED ORDER — DIPHENHYDRAMINE HCL 50 MG/ML IJ SOLN: 12.5000 mg | INTRAMUSCULAR | Status: DC | PRN

## 2013-12-17 MED ORDER — MISOPROSTOL 200 MCG PO TABS
50.0000 ug | ORAL_TABLET | ORAL | Status: DC
  Administered 2013-12-17 – 2013-12-18 (×2): 50 ug via ORAL

## 2013-12-17 MED ORDER — EPHEDRINE 5 MG/ML INJ
10.0000 mg | INTRAVENOUS | Status: DC | PRN
  Filled 2013-12-17: qty 4

## 2013-12-17 MED ORDER — TERBUTALINE SULFATE 1 MG/ML IJ SOLN: 0.2500 mg | Freq: Once | INTRAMUSCULAR | Status: AC | PRN

## 2013-12-17 MED ORDER — EPHEDRINE 5 MG/ML INJ: 10.0000 mg | INTRAVENOUS | Status: DC | PRN

## 2013-12-17 MED ORDER — PHENYLEPHRINE 40 MCG/ML (10ML) SYRINGE FOR IV PUSH (FOR BLOOD PRESSURE SUPPORT): 80.0000 ug | PREFILLED_SYRINGE | INTRAVENOUS | Status: DC | PRN

## 2013-12-17 MED ORDER — LACTATED RINGERS IV SOLN: 500.0000 mL | Freq: Once | INTRAVENOUS | Status: DC

## 2013-12-17 MED ORDER — FENTANYL 2.5 MCG/ML BUPIVACAINE 1/10 % EPIDURAL INFUSION (WH - ANES)
14.0000 mL/h | INTRAMUSCULAR | Status: DC | PRN
  Administered 2013-12-18 (×2): 14 mL/h via EPIDURAL
  Filled 2013-12-17: qty 125

## 2013-12-17 NOTE — Progress Notes (Signed)
Jaymie Mckiddy is a 28 y.o. G1P0 at [redacted]w[redacted]d by ultrasound admitted for induction of labor due to gestational HTN with severe features.  Subjective: Patient denies any HA, RUQ pain, vision changes. LE edema unchanged. Continues to be nauseous.  Objective: BP 147/97 mmHg  Pulse 77  Temp(Src) 97.6 F (36.4 C) (Oral)  Resp 18  Ht 5\' 1"  (1.549 m)  Wt 94.065 kg (207 lb 6 oz)  BMI 39.20 kg/m2  SpO2 100%  LMP 04/19/2013 I/O last 3 completed shifts: In: 5491.5 [P.O.:990; I.V.:4501.5] Out: 3376 [Urine:3375; Emesis/NG output:1] Total I/O In: 104.5 [I.V.:104.5] Out: 0   FHT:  FHR: 130 bpm, variability: moderate,  accelerations:  Present,  decelerations:  Absent - previously with prolonged decels, responded to trendelenburg and pitocin break UC:   irregular, every 8-10 minutes SVE:   Dilation: 3.5 Effacement (%): 40 Station: -3 Exam by:: Kellie Simmering, CNM  Labs: Lab Results  Component Value Date   WBC 13.9* 12/17/2013   HGB 10.7* 12/17/2013   HCT 31.8* 12/17/2013   MCV 80.7 12/17/2013   PLT 246 12/17/2013    Assessment / Plan: IOL for gHTN with severe features. s/p FB, cytotec and pitocin.  Will give PO Cyotec after contractions space.  Labor: Progressing slowly through IOL, latent phase Preeclampsia:  on magnesium sulfate, no signs or symptoms of toxicity, intake and ouput balanced and labs stable Fetal Wellbeing:  Category I Pain Control:  Fentanyl I/D:  GBS pos - on PCN Anticipated MOD:  NSVD  Lavon Paganini 12/17/2013, 8:50 PM

## 2013-12-17 NOTE — Progress Notes (Signed)
Monitor resume.

## 2013-12-17 NOTE — Progress Notes (Signed)
Adams, CNM student, notified of FHR deceleration, intervention and decreased variability. Notified second IV site obtained. RN requested to wait to start administration of pitocin until FHR becomes more reassuring. Provider reviewed strip. Orders to hold on pitocin and continue to monitor at this time.

## 2013-12-17 NOTE — Progress Notes (Signed)
Patient in bathroom, off monitor.

## 2013-12-17 NOTE — Progress Notes (Signed)
Katrina Garcia, CNM, notified that foley bulb is still in place on 14 milliunits of pitocin. Orders received to stop pitocin at 2000 and administer 50 mcg of oral cytotec. Pt may also have a light laboring diet when pitocin is off.

## 2013-12-17 NOTE — Progress Notes (Signed)
Patient ID: Katrina Garcia, female   DOB: 07-Jun-1985, 28 y.o.   MRN: [id number] S: C/O mild contractions O: FHR pattern baseline 130's with mod variability and accels noted. Sterile spec exam done a foly bulb introduced thru cervix and inflated with 60 cc sterile water. Pt and fetus tolerated well.  A: IOL for severe pre eclampsia. P: Manage BP's and pti induction

## 2013-12-17 NOTE — Progress Notes (Signed)
Katrina Garcia, CNM, in department. Pitocin to remain off and patient to receive a dose of oral cytotec as next plan of care.

## 2013-12-17 NOTE — Progress Notes (Signed)
Kellie Simmering, CNM, updated on pt status. Notified of BP's, rate of IV labetalol, H-PPH risk - orders received to type and cross for 2 units. No order received for a second IV line at this time.

## 2013-12-17 NOTE — Progress Notes (Signed)
MD resident notified of BP's of 109/61 and 122/66 on 1mg /min IV labetalol. Notified pt reported some nausea. Per Dr. Kennon Rounds, RN to decrease IV labetalol to 0.5mg /min.

## 2013-12-18 ENCOUNTER — Encounter (HOSPITAL_COMMUNITY)
Admission: AD | Disposition: A | Discharge status: Home / Self Care | Payer: Self-pay | Source: Ambulatory Visit | Attending: Family Medicine

## 2013-12-18 ENCOUNTER — Encounter (HOSPITAL_COMMUNITY): Payer: Self-pay | Admitting: Certified Registered Nurse Anesthetist

## 2013-12-18 ENCOUNTER — Inpatient Hospital Stay (HOSPITAL_COMMUNITY): Payer: Medicaid Other | Admitting: Anesthesiology

## 2013-12-18 ENCOUNTER — Encounter (HOSPITAL_COMMUNITY): Payer: Self-pay | Admitting: *Deleted

## 2013-12-18 DIAGNOSIS — O1413 Severe pre-eclampsia, third trimester: Secondary | ICD-10-CM

## 2013-12-18 DIAGNOSIS — O99344 Other mental disorders complicating childbirth: Secondary | ICD-10-CM

## 2013-12-18 DIAGNOSIS — O9832 Other infections with a predominantly sexual mode of transmission complicating childbirth: Secondary | ICD-10-CM

## 2013-12-18 DIAGNOSIS — Z3A34 34 weeks gestation of pregnancy: Secondary | ICD-10-CM

## 2013-12-18 LAB — COMPREHENSIVE METABOLIC PANEL
ALT: 17 U/L (ref 0–35)
AST: 20 U/L (ref 0–37)
Albumin: 2.6 g/dL — ABNORMAL LOW (ref 3.5–5.2)
Alkaline Phosphatase: 153 U/L — ABNORMAL HIGH (ref 39–117)
Anion gap: 13 (ref 5–15)
BUN: 7 mg/dL (ref 6–23)
CO2: 21 meq/L (ref 19–32)
Calcium: 7.8 mg/dL — ABNORMAL LOW (ref 8.4–10.5)
Chloride: 103 mEq/L (ref 96–112)
Creatinine, Ser: 0.61 mg/dL (ref 0.50–1.10)
GFR calc Af Amer: >90 mL/min (ref >90)
Glucose, Bld: 109 mg/dL — ABNORMAL HIGH (ref 70–99)
Potassium: 4.1 mEq/L (ref 3.7–5.3)
SODIUM: 137 meq/L (ref 137–147)
TOTAL PROTEIN: 6.2 g/dL (ref 6.0–8.3)
Total Bilirubin: 0.4 mg/dL (ref 0.3–1.2)

## 2013-12-18 LAB — CBC
HCT: 33.4 % — ABNORMAL LOW (ref 36.0–46.0)
HCT: 32.5 % — ABNORMAL LOW (ref 36.0–46.0)
HCT: 30.9 % — ABNORMAL LOW (ref 36.0–46.0)
Hemoglobin: 11.2 g/dL — ABNORMAL LOW (ref 12.0–15.0)
Hemoglobin: 11.2 g/dL — ABNORMAL LOW (ref 12.0–15.0)
Hemoglobin: 10.5 g/dL — ABNORMAL LOW (ref 12.0–15.0)
MCH: 27.3 pg (ref 26.0–34.0)
MCH: 27.7 pg (ref 26.0–34.0)
MCH: 27.3 pg (ref 26.0–34.0)
MCHC: 33.5 g/dL (ref 30.0–36.0)
MCHC: 34.5 g/dL (ref 30.0–36.0)
MCHC: 34 g/dL (ref 30.0–36.0)
MCV: 81.3 fL (ref 78.0–100.0)
MCV: 80.4 fL (ref 78.0–100.0)
MCV: 80.3 fL (ref 78.0–100.0)
PLATELETS: 292 10*3/uL (ref 150–400)
Platelets: 265 10*3/uL (ref 150–400)
Platelets: 273 10*3/uL (ref 150–400)
RBC: 4.11 MIL/uL (ref 3.87–5.11)
RBC: 4.04 MIL/uL (ref 3.87–5.11)
RBC: 3.85 MIL/uL — ABNORMAL LOW (ref 3.87–5.11)
RDW: 13.4 % (ref 11.5–15.5)
RDW: 13.2 % (ref 11.5–15.5)
RDW: 13.4 % (ref 11.5–15.5)
WBC: 13.5 10*3/uL — ABNORMAL HIGH (ref 4.0–10.5)
WBC: 13.5 10*3/uL — ABNORMAL HIGH (ref 4.0–10.5)
WBC: 13.7 10*3/uL — ABNORMAL HIGH (ref 4.0–10.5)

## 2013-12-18 LAB — TYPE AND SCREEN
ABO/RH(D): B POS
Antibody Screen: NEGATIVE

## 2013-12-18 LAB — MAGNESIUM: Magnesium: 4.6 mg/dL — ABNORMAL HIGH (ref 1.5–2.5)

## 2013-12-18 SURGERY — Surgical Case
Anesthesia: Epidural | Site: Abdomen

## 2013-12-18 MED ORDER — FENTANYL CITRATE 0.05 MG/ML IJ SOLN
INTRAMUSCULAR | Status: AC
  Filled 2013-12-18: qty 2

## 2013-12-18 MED ORDER — ONDANSETRON HCL 4 MG/2ML IJ SOLN
INTRAMUSCULAR | Status: AC
  Filled 2013-12-18: qty 2

## 2013-12-18 MED ORDER — OXYCODONE-ACETAMINOPHEN 5-325 MG PO TABS
2.0000 | ORAL_TABLET | ORAL | Status: DC | PRN
  Administered 2013-12-20: 2 via ORAL

## 2013-12-18 MED ORDER — ONDANSETRON HCL 4 MG/2ML IJ SOLN
INTRAMUSCULAR | Status: DC | PRN
  Administered 2013-12-18: 4 mg via INTRAVENOUS

## 2013-12-18 MED ORDER — LIDOCAINE-EPINEPHRINE (PF) 2 %-1:200000 IJ SOLN
INTRAMUSCULAR | Status: AC
  Filled 2013-12-18: qty 20

## 2013-12-18 MED ORDER — LACTATED RINGERS IV SOLN
INTRAVENOUS | Status: DC
Start: 2013-12-19 — End: 2013-12-19
  Administered 2013-12-19: 03:00:00 via INTRAVENOUS

## 2013-12-18 MED ORDER — MENTHOL 3 MG MT LOZG: 1.0000 | LOZENGE | OROMUCOSAL | Status: DC | PRN

## 2013-12-18 MED ORDER — MEPERIDINE HCL 25 MG/ML IJ SOLN: 6.2500 mg | INTRAMUSCULAR | Status: DC | PRN

## 2013-12-18 MED ORDER — MORPHINE SULFATE 0.5 MG/ML IJ SOLN
INTRAMUSCULAR | Status: AC
Start: 2013-12-18 — End: 2013-12-18
  Filled 2013-12-18: qty 10

## 2013-12-18 MED ORDER — SIMETHICONE 80 MG PO CHEW
80.0000 mg | CHEWABLE_TABLET | ORAL | Status: DC | PRN
  Filled 2013-12-18: qty 1

## 2013-12-18 MED ORDER — LANOLIN HYDROUS EX OINT: 1.0000 "application " | TOPICAL_OINTMENT | CUTANEOUS | Status: DC | PRN

## 2013-12-18 MED ORDER — LIDOCAINE HCL (PF) 1 % IJ SOLN
INTRAMUSCULAR | Status: DC | PRN
  Administered 2013-12-18 (×4): 4 mL

## 2013-12-18 MED ORDER — TETANUS-DIPHTH-ACELL PERTUSSIS 5-2.5-18.5 LF-MCG/0.5 IM SUSP
0.5000 mL | Freq: Once | INTRAMUSCULAR | Status: DC
  Filled 2013-12-18: qty 0.5

## 2013-12-18 MED ORDER — FENTANYL CITRATE 0.05 MG/ML IJ SOLN
25.0000 ug | INTRAMUSCULAR | Status: DC | PRN
  Administered 2013-12-18 (×2): 50 ug via INTRAVENOUS

## 2013-12-18 MED ORDER — OXYTOCIN 40 UNITS IN LACTATED RINGERS INFUSION - SIMPLE MED: 62.5000 mL/h | INTRAVENOUS | Status: AC

## 2013-12-18 MED ORDER — SODIUM CHLORIDE 0.9 % IJ SOLN
INTRAMUSCULAR | Status: AC
  Filled 2013-12-18: qty 3

## 2013-12-18 MED ORDER — SIMETHICONE 80 MG PO CHEW
80.0000 mg | CHEWABLE_TABLET | ORAL | Status: DC
  Administered 2013-12-19 – 2013-12-21 (×4): 80 mg via ORAL
  Filled 2013-12-18 (×4): qty 1

## 2013-12-18 MED ORDER — LACTATED RINGERS IV SOLN: INTRAVENOUS | Status: DC

## 2013-12-18 MED ORDER — SIMETHICONE 80 MG PO CHEW
80.0000 mg | CHEWABLE_TABLET | Freq: Three times a day (TID) | ORAL | Status: DC
  Administered 2013-12-19 – 2013-12-22 (×8): 80 mg via ORAL
  Filled 2013-12-18 (×6): qty 1

## 2013-12-18 MED ORDER — MORPHINE SULFATE (PF) 0.5 MG/ML IJ SOLN
INTRAMUSCULAR | Status: DC | PRN
  Administered 2013-12-18: 4 mg via EPIDURAL

## 2013-12-18 MED ORDER — MEPERIDINE HCL 25 MG/ML IJ SOLN
INTRAMUSCULAR | Status: AC
  Filled 2013-12-18: qty 1

## 2013-12-18 MED ORDER — WITCH HAZEL-GLYCERIN EX PADS: 1.0000 "application " | MEDICATED_PAD | CUTANEOUS | Status: DC | PRN

## 2013-12-18 MED ORDER — OXYTOCIN 40 UNITS IN LACTATED RINGERS INFUSION - SIMPLE MED
1.0000 m[IU]/min | INTRAVENOUS | Status: DC
  Administered 2013-12-18 (×2): 2 m[IU]/min via INTRAVENOUS

## 2013-12-18 MED ORDER — DIBUCAINE 1 % RE OINT: 1.0000 "application " | TOPICAL_OINTMENT | RECTAL | Status: DC | PRN

## 2013-12-18 MED ORDER — ZOLPIDEM TARTRATE 5 MG PO TABS
5.0000 mg | ORAL_TABLET | Freq: Every evening | ORAL | Status: DC | PRN
  Administered 2013-12-21: 5 mg via ORAL
  Filled 2013-12-18: qty 1

## 2013-12-18 MED ORDER — PRENATAL MULTIVITAMIN CH
1.0000 | ORAL_TABLET | Freq: Every day | ORAL | Status: DC
  Administered 2013-12-19 – 2013-12-22 (×4): 1 via ORAL
  Filled 2013-12-18 (×4): qty 1

## 2013-12-18 MED ORDER — ONDANSETRON HCL 4 MG/2ML IJ SOLN: 4.0000 mg | INTRAMUSCULAR | Status: DC | PRN

## 2013-12-18 MED ORDER — BUPIVACAINE HCL (PF) 0.25 % IJ SOLN
INTRAMUSCULAR | Status: AC
  Filled 2013-12-18: qty 30

## 2013-12-18 MED ORDER — HYDRALAZINE HCL 20 MG/ML IJ SOLN
10.0000 mg | Freq: Once | INTRAMUSCULAR | Status: AC
  Administered 2013-12-18: 10 mg via INTRAVENOUS

## 2013-12-18 MED ORDER — OXYTOCIN 10 UNIT/ML IJ SOLN
40.0000 [IU] | INTRAMUSCULAR | Status: DC | PRN
  Administered 2013-12-18: 40 [IU] via INTRAVENOUS

## 2013-12-18 MED ORDER — ONDANSETRON HCL 4 MG PO TABS: 4.0000 mg | ORAL_TABLET | ORAL | Status: DC | PRN

## 2013-12-18 MED ORDER — MAGNESIUM SULFATE 40 G IN LACTATED RINGERS - SIMPLE
2.0000 g/h | INTRAVENOUS | Status: DC
  Filled 2013-12-18: qty 500

## 2013-12-18 MED ORDER — LACTATED RINGERS IV SOLN
INTRAVENOUS | Status: DC | PRN
  Administered 2013-12-18: 20:00:00 via INTRAVENOUS

## 2013-12-18 MED ORDER — CEFAZOLIN SODIUM-DEXTROSE 2-3 GM-% IV SOLR
2.0000 g | INTRAVENOUS | Status: DC
  Filled 2013-12-18: qty 50

## 2013-12-18 MED ORDER — IBUPROFEN 600 MG PO TABS
600.0000 mg | ORAL_TABLET | Freq: Four times a day (QID) | ORAL | Status: DC
  Administered 2013-12-19 – 2013-12-22 (×14): 600 mg via ORAL
  Filled 2013-12-18 (×14): qty 1

## 2013-12-18 MED ORDER — PROMETHAZINE HCL 25 MG/ML IJ SOLN: 6.2500 mg | INTRAMUSCULAR | Status: DC | PRN

## 2013-12-18 MED ORDER — DIPHENHYDRAMINE HCL 25 MG PO CAPS
25.0000 mg | ORAL_CAPSULE | Freq: Four times a day (QID) | ORAL | Status: DC | PRN
  Filled 2013-12-18: qty 1

## 2013-12-18 MED ORDER — LIDOCAINE-EPINEPHRINE (PF) 2 %-1:200000 IJ SOLN
INTRAMUSCULAR | Status: DC | PRN
  Administered 2013-12-18: 5 mL via EPIDURAL
  Administered 2013-12-18: 2 mL via EPIDURAL
  Administered 2013-12-18: 5 mL via EPIDURAL
  Administered 2013-12-18: 3 mL via EPIDURAL
  Administered 2013-12-18: 5 mL via EPIDURAL

## 2013-12-18 MED ORDER — SENNOSIDES-DOCUSATE SODIUM 8.6-50 MG PO TABS
2.0000 | ORAL_TABLET | ORAL | Status: DC
  Administered 2013-12-19 – 2013-12-21 (×4): 2 via ORAL
  Filled 2013-12-18 (×4): qty 2

## 2013-12-18 MED ORDER — KETOROLAC TROMETHAMINE 30 MG/ML IJ SOLN: 15.0000 mg | Freq: Once | INTRAMUSCULAR | Status: DC | PRN

## 2013-12-18 MED ORDER — SCOPOLAMINE 1 MG/3DAYS TD PT72
MEDICATED_PATCH | TRANSDERMAL | Status: AC
  Administered 2013-12-18: 1.5 mg
  Filled 2013-12-18: qty 1

## 2013-12-18 MED ORDER — MEPERIDINE HCL 25 MG/ML IJ SOLN
INTRAMUSCULAR | Status: DC | PRN
  Administered 2013-12-18: 12.5 mg via INTRAVENOUS

## 2013-12-18 MED ORDER — CEFAZOLIN SODIUM-DEXTROSE 2-3 GM-% IV SOLR
INTRAVENOUS | Status: DC | PRN
  Administered 2013-12-18: 2 g via INTRAVENOUS

## 2013-12-18 MED ORDER — OXYTOCIN 10 UNIT/ML IJ SOLN
INTRAMUSCULAR | Status: AC
  Filled 2013-12-18: qty 4

## 2013-12-18 MED ORDER — OXYCODONE-ACETAMINOPHEN 5-325 MG PO TABS
1.0000 | ORAL_TABLET | ORAL | Status: DC | PRN
  Administered 2013-12-19 – 2013-12-22 (×5): 1 via ORAL
  Filled 2013-12-18 (×8): qty 1

## 2013-12-18 MED ORDER — BUPIVACAINE HCL (PF) 0.25 % IJ SOLN
INTRAMUSCULAR | Status: DC | PRN
  Administered 2013-12-18: 30 mL

## 2013-12-18 MED ORDER — KETOROLAC TROMETHAMINE 30 MG/ML IJ SOLN
INTRAMUSCULAR | Status: AC
  Administered 2013-12-18: 30 mg via INTRAVENOUS
  Filled 2013-12-18: qty 1

## 2013-12-18 MED ORDER — FENTANYL CITRATE 0.05 MG/ML IJ SOLN
INTRAMUSCULAR | Status: DC | PRN
  Administered 2013-12-18: 100 ug via EPIDURAL

## 2013-12-18 SURGICAL SUPPLY — 39 items
APL SKNCLS STERI-STRIP NONHPOA (GAUZE/BANDAGES/DRESSINGS) ×1
BENZOIN TINCTURE PRP APPL 2/3 (GAUZE/BANDAGES/DRESSINGS) ×3 IMPLANT
CATH ROBINSON RED A/P 16FR (CATHETERS) IMPLANT
CLAMP CORD UMBIL (MISCELLANEOUS) IMPLANT
CLOSURE WOUND 1/2 X4 (GAUZE/BANDAGES/DRESSINGS) ×1
CLOTH BEACON ORANGE TIMEOUT ST (SAFETY) ×3 IMPLANT
DRAPE SHEET LG 3/4 BI-LAMINATE (DRAPES) IMPLANT
DRSG OPSITE POSTOP 4X10 (GAUZE/BANDAGES/DRESSINGS) ×3 IMPLANT
DRSG TELFA 3X8 NADH (GAUZE/BANDAGES/DRESSINGS) IMPLANT
DURAPREP 26ML APPLICATOR (WOUND CARE) ×3 IMPLANT
ELECT REM PT RETURN 9FT ADLT (ELECTROSURGICAL) ×3
ELECTRODE REM PT RTRN 9FT ADLT (ELECTROSURGICAL) ×1 IMPLANT
EXTRACTOR VACUUM M CUP 4 TUBE (SUCTIONS) IMPLANT
EXTRACTOR VACUUM M CUP 4' TUBE (SUCTIONS)
GLOVE BIOGEL PI IND STRL 7.5 (GLOVE) ×2 IMPLANT
GLOVE BIOGEL PI INDICATOR 7.5 (GLOVE) ×4
GLOVE ECLIPSE 7.5 STRL STRAW (GLOVE) ×3 IMPLANT
GOWN STRL REUS W/TWL LRG LVL3 (GOWN DISPOSABLE) ×9 IMPLANT
KIT ABG SYR 3ML LUER SLIP (SYRINGE) IMPLANT
NDL HYPO 25X5/8 SAFETYGLIDE (NEEDLE) IMPLANT
NEEDLE HYPO 22GX1.5 SAFETY (NEEDLE) ×3 IMPLANT
NEEDLE HYPO 25X5/8 SAFETYGLIDE (NEEDLE) IMPLANT
NS IRRIG 1000ML POUR BTL (IV SOLUTION) ×3 IMPLANT
PACK C SECTION WH (CUSTOM PROCEDURE TRAY) ×3 IMPLANT
PAD DRESSING TELFA 3X8 NADH (GAUZE/BANDAGES/DRESSINGS) ×1 IMPLANT
PAD OB MATERNITY 4.3X12.25 (PERSONAL CARE ITEMS) ×3 IMPLANT
RTRCTR C-SECT PINK 25CM LRG (MISCELLANEOUS) ×2 IMPLANT
STRIP CLOSURE SKIN 1/2X4 (GAUZE/BANDAGES/DRESSINGS) ×2 IMPLANT
SUT MNCRL 0 VIOLET CTX 36 (SUTURE) IMPLANT
SUT MONOCRYL 0 CTX 36 (SUTURE)
SUT VIC AB 0 CTX 36 (SUTURE) ×9
SUT VIC AB 0 CTX36XBRD ANBCTRL (SUTURE) ×3 IMPLANT
SUT VIC AB 2-0 CT1 27 (SUTURE) ×3
SUT VIC AB 2-0 CT1 TAPERPNT 27 (SUTURE) ×1 IMPLANT
SUT VIC AB 4-0 KS 27 (SUTURE) ×3 IMPLANT
SYR 30ML LL (SYRINGE) ×3 IMPLANT
TOWEL OR 17X24 6PK STRL BLUE (TOWEL DISPOSABLE) ×3 IMPLANT
TRAY FOLEY CATH 14FR (SET/KITS/TRAYS/PACK) ×1 IMPLANT
WATER STERILE IRR 1000ML POUR (IV SOLUTION) ×1 IMPLANT

## 2013-12-18 NOTE — Anesthesia Postprocedure Evaluation (Signed)
  Anesthesia Post-op Note  Patient: Katrina Garcia  Procedure(s) Performed: Procedure(s): CESAREAN SECTION (N/A)  Patient Location: PACU  Anesthesia Type:Epidural  Level of Consciousness: awake, alert  and oriented  Airway and Oxygen Therapy: Patient Spontanous Breathing  Post-op Pain: none  Post-op Assessment: Post-op Vital signs reviewed, Patient's Cardiovascular Status Stable, Respiratory Function Stable, Patent Airway, No signs of Nausea or vomiting, Pain level controlled, No headache, No backache, No residual numbness and No residual motor weakness. Will leave A-line in place for hemodynamic monitoring in AICU. D/C Epidural catheter.  Post-op Vital Signs: Reviewed and stable  Last Vitals:  Filed Vitals:   12/18/13 2115  BP: 109/53  Pulse: 70  Temp: 36.4 C  Resp: 23    Complications: No apparent anesthesia complications

## 2013-12-18 NOTE — Op Note (Signed)
Katrina Garcia PROCEDURE DATE: 12/15/2013 - 12/18/2013  PREOPERATIVE DIAGNOSIS: Intrauterine pregnancy at  [redacted]w[redacted]d weeks gestation; failure to progress: arrest of dilation, non-reassuring fetal status and severe preeclampsia  POSTOPERATIVE DIAGNOSIS: The same  PROCEDURE: Primary Low Transverse Cesarean Section  SURGEON:  Dr. Loma Boston  ASSISTANT: Dr Shelbie Hutching  INDICATIONS: Katrina Garcia is a 28 y.o. G1P0101 at [redacted]w[redacted]d scheduled for cesarean section secondary to failure to progress: arrest of dilation, non-reassuring fetal status and severe preeclampsia.  The risks of cesarean section discussed with the patient included but were not limited to: bleeding which may require transfusion or reoperation; infection which may require antibiotics; injury to bowel, bladder, ureters or other surrounding organs; injury to the fetus; need for additional procedures including hysterectomy in the event of a life-threatening hemorrhage; placental abnormalities wth subsequent pregnancies, incisional problems, thromboembolic phenomenon and other postoperative/anesthesia complications. The patient concurred with the proposed plan, giving informed written consent for the procedure.    FINDINGS:  Viable female infant in cephalic presentation.  Apgars 3 and 7, weight, 5 pounds and 12 ounces.  Clear amniotic fluid.  Intact placenta, three vessel cord.  Normal uterus, fallopian tubes and ovaries bilaterally.  ANESTHESIA:    Spinal INTRAVENOUS FLUIDS:400 ml ESTIMATED BLOOD LOSS: 700 ml URINE OUTPUT:  50 ml SPECIMENS: Placenta sent to pathology  COMPLICATIONS: None immediate  PROCEDURE IN DETAIL:  The patient received intravenous antibiotics and had sequential compression devices applied to her lower extremities while in the preoperative area.  She was then taken to the operating room where epidural anesthesia was dosed up to surgical level and was found to be adequate. She was then placed in a dorsal supine position  with a leftward tilt, and prepped and draped in a sterile manner.  A foley catheter was placed into her bladder and attached to constant gravity, which drained clear fluid throughout.  After an adequate timeout was performed, a Pfannenstiel skin incision was made with scalpel and carried through to the underlying layer of fascia. The fascia was incised in the midline and this incision was extended bilaterally using the Mayo scissors. Kocher clamps were applied to the superior aspect of the fascial incision and the underlying rectus muscles were dissected off bluntly. A similar process was carried out on the inferior aspect of the facial incision. The rectus muscles were separated in the midline bluntly and the peritoneum was entered bluntly. An Alexis retractor was placed to aid in visualization of the uterus.  Attention was turned to the lower uterine segment where a transverse hysterotomy was made with a scalpel and extended bilaterally bluntly. The infant was successfully delivered, and cord was clamped and cut and infant was handed over to awaiting neonatology team. Uterine massage was then administered and the placenta delivered intact with three-vessel cord. The uterus was then cleared of clot and debris.  The hysterotomy was closed with 0 Vicryl in a running locked fashion, and an imbricating layer was also placed with a 0 Vicryl. Overall, excellent hemostasis was noted. The abdomen and the pelvis were cleared of all clot and debris and the Katrina Garcia was removed. Hemostasis was confirmed on all surfaces.  The peritoneum was reapproximated using 2-0 vicryl running stitches. The fascia was then closed using 0 Vicryl in a running fashion.  The skin was closed with 4-0 vicryl. The patient tolerated the procedure well. Sponge, lap, instrument and needle counts were correct x 2. She was taken to the recovery room in stable condition.    Katrina Garcia  Katrina Garcia 12/18/2013 9:07 PM

## 2013-12-18 NOTE — Progress Notes (Signed)
Arterial line inserted.  Will monitor patients blood pressure until and during epidural.  BP increased, DR.Cassidy aware and advised to titrate medication as ordered. MD aware SBP >200, DBP>110.

## 2013-12-18 NOTE — Progress Notes (Signed)
Patient ID: Katrina Garcia, female   DOB: 1985/12/06, 28 y.o.   MRN: [id number]  Patient having recurring late decelerations with every contraction since restarting the pitocin.   Dilation: 4 Effacement (%): 50 Cervical Position: Posterior Station: -3 Presentation: Vertex Exam by:: Dr Nehemiah Settle  Exam unchanged.  The risks of cesarean section discussed with the patient included but were not limited to: bleeding which may require transfusion or reoperation; infection which may require antibiotics; injury to bowel, bladder, ureters or other surrounding organs; injury to the fetus; need for additional procedures including hysterectomy in the event of a life-threatening hemorrhage; placental abnormalities wth subsequent pregnancies, incisional problems, thromboembolic phenomenon and other postoperative/anesthesia complications. The patient concurred with the proposed plan, giving informed written consent for the procedure.   Patient has been NPO since last night she will remain NPO for procedure. Anesthesia and OR aware.  Preoperative prophylactic Ancef ordered on call to the OR.  To OR when ready.  Loma Boston, DO 12/18/2013 7:13 PM

## 2013-12-18 NOTE — Progress Notes (Signed)
Katrina Garcia is a 28 y.o. G1P0 at [redacted]w[redacted]d by ultrasound admitted for induction of labor due to Pre-eclamptic toxemia of pregnancy..  Subjective:   Objective: BP 150/89 mmHg  Pulse 76  Temp(Src) 97.9 F (36.6 C) (Axillary)  Resp 20  Ht 5\' 1"  (1.549 m)  Wt 207 lb 6 oz (94.065 kg)  BMI 39.20 kg/m2  SpO2 100%  LMP 04/19/2013 I/O last 3 completed shifts: In: 5491.5 [P.O.:990; I.V.:4501.5] Out: 3376 [Urine:3375; Emesis/NG output:1] Total I/O In: 2957 [P.O.:40; I.V.:950; IV Piggyback:350] Out: 1200 [Urine:1200]  FHT:  FHR: 140 bpm, variability: moderate,  accelerations:  Present,  decelerations:  Absent UC:   Mild irreg contractions SVE:   Dilation: 4 Effacement (%): 40 Station: -3 Exam by:: Katrina Garcia, CNM  Labs: Lab Results  Component Value Date   WBC 13.9* 12/17/2013   HGB 10.7* 12/17/2013   HCT 31.8* 12/17/2013   MCV 80.7 12/17/2013   PLT 246 12/17/2013    Assessment / Plan: Induction of labor due to preeclampsia,  progressing well on pitocin  Labor: mild irreg pit pains Preeclampsia:  intake and ouput balanced and labs stable Fetal Wellbeing:  Category I Pain Control:  Labor support without medications I/D:  n/a Anticipated MOD:  NSVD  Katrina Garcia Katrina Garcia 12/18/2013, 5:35 AM

## 2013-12-18 NOTE — Transfer of Care (Signed)
Immediate Anesthesia Transfer of Care Note  Patient: Katrina Garcia  Procedure(s) Performed: Procedure(s): CESAREAN SECTION (N/A)  Patient Location: PACU  Anesthesia Type:Epidural  Level of Consciousness: sedated  Airway & Oxygen Therapy: Patient Spontanous Breathing and Patient connected to nasal cannula oxygen  Post-op Assessment: Report given to PACU RN and Post -op Vital signs reviewed and stable  Post vital signs: stable  Complications: No apparent anesthesia complications

## 2013-12-18 NOTE — Anesthesia Procedure Notes (Signed)
Epidural Patient location during procedure: OB Start time: 12/18/2013 3:22 PM  Staffing Anesthesiologist: Tobias Avitabile Performed by: anesthesiologist   Preanesthetic Checklist Completed: patient identified, site marked, surgical consent, pre-op evaluation, timeout performed, IV checked, risks and benefits discussed and monitors and equipment checked  Epidural Patient position: sitting Prep: site prepped and draped and DuraPrep Patient monitoring: continuous pulse ox and blood pressure Approach: midline Location: L3-L4 Injection technique: LOR air  Needle:  Needle type: Tuohy  Needle gauge: 17 G Needle length: 9 cm and 9 Needle insertion depth: 6 cm Catheter type: closed end flexible Catheter size: 19 Gauge Catheter at skin depth: 11 cm Test dose: negative  Assessment Events: blood not aspirated, injection not painful, no injection resistance, negative IV test and no paresthesia  Additional Notes Discussed risk of headache, infection, bleeding, nerve injury and failed or incomplete block.  Patient voices understanding and wishes to proceed.  Epidural placed easily on first attempt.  No paresthesia.  Patient tolerated procedure well with no apparent complications.  A. Royale Lennartz< MDReason for block:procedure for pain
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
## 2013-12-19 ENCOUNTER — Encounter (HOSPITAL_COMMUNITY): Payer: Self-pay | Admitting: Family Medicine

## 2013-12-19 ENCOUNTER — Telehealth (HOSPITAL_COMMUNITY): Payer: Self-pay | Admitting: *Deleted

## 2013-12-19 LAB — CBC
HCT: 31.2 % — ABNORMAL LOW (ref 36.0–46.0)
Hemoglobin: 10.6 g/dL — ABNORMAL LOW (ref 12.0–15.0)
MCH: 27.3 pg (ref 26.0–34.0)
MCHC: 34 g/dL (ref 30.0–36.0)
MCV: 80.4 fL (ref 78.0–100.0)
Platelets: 275 10*3/uL (ref 150–400)
RBC: 3.88 MIL/uL (ref 3.87–5.11)
RDW: 13.3 % (ref 11.5–15.5)
WBC: 13.1 10*3/uL — AB (ref 4.0–10.5)

## 2013-12-19 LAB — TYPE AND SCREEN
ABO/RH(D): B POS
Antibody Screen: NEGATIVE
UNIT DIVISION: 0
Unit division: 0
Unit division: 0

## 2013-12-19 MED ORDER — FUROSEMIDE 10 MG/ML IJ SOLN
20.0000 mg | Freq: Once | INTRAMUSCULAR | Status: AC
Start: 2013-12-19 — End: 2013-12-19
  Administered 2013-12-19: 20 mg via INTRAVENOUS
  Filled 2013-12-19: qty 2

## 2013-12-19 MED ORDER — KETOROLAC TROMETHAMINE 30 MG/ML IJ SOLN: 30.0000 mg | Freq: Four times a day (QID) | INTRAMUSCULAR | Status: AC | PRN

## 2013-12-19 MED ORDER — SCOPOLAMINE 1 MG/3DAYS TD PT72
1.0000 | MEDICATED_PATCH | Freq: Once | TRANSDERMAL | Status: DC
  Filled 2013-12-19: qty 1

## 2013-12-19 MED ORDER — FUROSEMIDE 10 MG/ML IJ SOLN
10.0000 mg | Freq: Once | INTRAMUSCULAR | Status: AC
  Administered 2013-12-19: 10 mg via INTRAVENOUS
  Filled 2013-12-19: qty 2

## 2013-12-19 MED ORDER — NALBUPHINE HCL 10 MG/ML IJ SOLN: 5.0000 mg | INTRAMUSCULAR | Status: DC | PRN

## 2013-12-19 MED ORDER — ONDANSETRON HCL 4 MG/2ML IJ SOLN: 4.0000 mg | Freq: Three times a day (TID) | INTRAMUSCULAR | Status: DC | PRN

## 2013-12-19 MED ORDER — DEXTROSE 5 % IV SOLN
1.0000 ug/kg/h | INTRAVENOUS | Status: DC | PRN
  Filled 2013-12-19: qty 2

## 2013-12-19 MED ORDER — LACTATED RINGERS IV SOLN
INTRAVENOUS | Status: AC
  Administered 2013-12-19: 13:00:00 via INTRAVENOUS

## 2013-12-19 MED ORDER — SODIUM CHLORIDE 0.9 % IJ SOLN
3.0000 mL | Freq: Two times a day (BID) | INTRAMUSCULAR | Status: DC
  Administered 2013-12-19 – 2013-12-21 (×3): 3 mL via INTRAVENOUS

## 2013-12-19 MED ORDER — SODIUM CHLORIDE 0.9 % IJ SOLN
3.0000 mL | INTRAMUSCULAR | Status: DC | PRN
Start: 2013-12-19 — End: 2013-12-22
  Administered 2013-12-21: 3 mL via INTRAVENOUS
  Filled 2013-12-19: qty 3

## 2013-12-19 MED ORDER — HYDRALAZINE HCL 20 MG/ML IJ SOLN: 5.0000 mg | INTRAMUSCULAR | Status: DC | PRN

## 2013-12-19 MED ORDER — SODIUM CHLORIDE 0.9 % IJ SOLN: 3.0000 mL | INTRAMUSCULAR | Status: DC | PRN

## 2013-12-19 MED ORDER — DIPHENHYDRAMINE HCL 50 MG/ML IJ SOLN: 12.5000 mg | INTRAMUSCULAR | Status: DC | PRN

## 2013-12-19 MED ORDER — AMLODIPINE BESYLATE 10 MG PO TABS
10.0000 mg | ORAL_TABLET | Freq: Every day | ORAL | Status: DC
  Administered 2013-12-19 – 2013-12-22 (×4): 10 mg via ORAL
  Filled 2013-12-19 (×5): qty 1

## 2013-12-19 MED ORDER — MAGNESIUM SULFATE 40 G IN LACTATED RINGERS - SIMPLE
2.0000 g/h | INTRAVENOUS | Status: AC
  Administered 2013-12-19: 2 g/h via INTRAVENOUS
  Filled 2013-12-19: qty 500

## 2013-12-19 MED ORDER — NALBUPHINE HCL 10 MG/ML IJ SOLN: 5.0000 mg | Freq: Once | INTRAMUSCULAR | Status: AC | PRN

## 2013-12-19 MED ORDER — NALOXONE HCL 0.4 MG/ML IJ SOLN: 0.4000 mg | INTRAMUSCULAR | Status: DC | PRN

## 2013-12-19 MED ORDER — DIPHENHYDRAMINE HCL 25 MG PO CAPS: 25.0000 mg | ORAL_CAPSULE | ORAL | Status: DC | PRN

## 2013-12-19 NOTE — Telephone Encounter (Signed)
mistake

## 2013-12-19 NOTE — Lactation Note (Signed)
This note was copied from the chart of [facility]. Lactation Consultation Note  Visit with mom in [place].  RN has assisted mom with pumping with DEBP.  Mom states she has seen a few drops on the inside of the flange.  Providing Breastmilk for Your Baby In NICU given.  Instructed to pump every 3 hours for 15 minutes. Encouraged to call with concerns prn.  Patient Name: [name] [id number] Date: 12/19/2013     Maternal Data    Feeding    LATCH Score/Interventions                      Lactation Tools Discussed/Used     Consult Status      Ave Filter 12/19/2013, 4:51 PM

## 2013-12-19 NOTE — Progress Notes (Signed)
Subjective: Postpartum Day 1: Cesarean Delivery Patient reports incisional pain and tolerating PO.  OOB and ambulating with assistance.  Had 1 dose of IV lasix with good diuresis.  BP fluctuating.  Objective: Vital signs in last 24 hours: Temp:  [97.5 F (36.4 C)-98.8 F (37.1 C)] 97.8 F (36.6 C) (12/15 0400) Pulse Rate:  [20-162] 66 (12/15 0700) Resp:  [13-34] 13 (12/15 0700) BP: (109-185)/(53-108) 151/94 mmHg (12/15 0700) SpO2:  [87 %-100 %] 98 % (12/15 0700) Arterial Line BP: (140-171)/(64-93) 150/79 mmHg (12/15 0300) Weight:  [203 lb 9.6 oz (92.352 kg)] 203 lb 9.6 oz (92.352 kg) (12/15 0000)  Physical Exam:  General: alert, cooperative and no distress Heart: regular rate, no murmur Lungs: clear to auscultation bilaterally, no wheezing. Lochia: appropriate Uterine Fundus: firm Incision: no significant drainage DVT Evaluation: No evidence of DVT seen on physical exam. Negative Homan's sign. No cords or calf tenderness. 2+ edema   Recent Labs  12/18/13 2143 12/19/13 0515  HGB 10.5* 10.6*  HCT 30.9* 31.2*    Assessment/Plan: Status post Cesarean section. Doing well postoperatively.  Continue current care. Will give additional dose of lasix. Will also start amlodipine with SBP 150s Magnesium for 24 hours (end 2100 tonight). D/c tele and A line.  STINSON, JACOB JEHIEL 12/19/2013, 7:53 AM

## 2013-12-19 NOTE — Anesthesia Postprocedure Evaluation (Signed)
Anesthesia Post Note  Patient: Katrina Garcia  Procedure(s) Performed: Procedure(s) (LRB): CESAREAN SECTION (N/A)  Anesthesia type: Epidural  Patient location: Mother/Baby  Post pain: Pain level controlled  Post assessment: Post-op Vital signs reviewed  Last Vitals:  Filed Vitals:   12/19/13 1000  BP: 151/82  Pulse: 64  Temp:   Resp: 18    Post vital signs: Reviewed  Level of consciousness:alert  Complications: No apparent anesthesia complications

## 2013-12-19 NOTE — Addendum Note (Signed)
Addendum  created 12/19/13 1016 by Lenox Ponds, CRNA   Modules edited: Notes Section   Notes Section:  File: [id number]

## 2013-12-20 ENCOUNTER — Ambulatory Visit (HOSPITAL_COMMUNITY): Payer: Medicaid Other

## 2013-12-20 ENCOUNTER — Encounter (HOSPITAL_COMMUNITY): Payer: Self-pay | Admitting: *Deleted

## 2013-12-20 NOTE — Lactation Note (Signed)
This note was copied from the chart of [place]. Lactation Consultation Note  NICU RN called for feeding assist.  Assisted with postioning baby skin to skin in cross cradle hold.  Mom's nipple brushed lightly on baby's lips but baby showing no interest in feeding and very sleepy.  Reassured mom and discussed normal late preterm feeding norms.  Mom is pumping every 3 hours and obtaining drops of colostrum.  Encouraged to continue pumping schedule and to call with concerns prn. Patient Name: Katrina Garcia [id number] Date: 12/20/2013 Reason for consult: Follow-up assessment;NICU baby   Maternal Data    Feeding Feeding Type: Breast Fed Nipple Type: Slow - flow Length of feed: 30 min  LATCH Score/Interventions Latch: Too sleepy or reluctant, no latch achieved, no sucking elicited.  Audible Swallowing: None Intervention(s): Skin to skin;Hand expression  Type of Nipple: Everted at rest and after stimulation  Comfort (Breast/Nipple): Soft / non-tender     Hold (Positioning): Assistance needed to correctly position infant at breast and maintain latch. Intervention(s): Breastfeeding basics reviewed;Support Pillows;Position options;Skin to skin  LATCH Score: 5  Lactation Tools Discussed/Used     Consult Status      Ave Filter 12/20/2013, 12:06 PM

## 2013-12-20 NOTE — Progress Notes (Signed)
Subjective: Postpartum Day 2: Cesarean Delivery Patient reports incisional pain, tolerating PO, + flatus and no problems voiding.  BP well controlled with amlodipine.  BP this AM high - taken prior to receiving medication  Objective: Vital signs in last 24 hours: Temp:  [97.9 F (36.6 C)-98.9 F (37.2 C)] 98.9 F (37.2 C) (12/16 0555) Pulse Rate:  [61-87] 61 (12/16 0555) Resp:  [16-20] 18 (12/16 0555) BP: (106-165)/(56-84) 165/84 mmHg (12/16 0555) SpO2:  [97 %-100 %] 100 % (12/16 0555) Weight:  [202 lb 1.9 oz (91.681 kg)] 202 lb 1.9 oz (91.681 kg) (12/16 0555)  Physical Exam:  General: alert, cooperative and no distress Lochia: appropriate Uterine Fundus: firm Incision: no significant drainage, no significant erythema DVT Evaluation: No evidence of DVT seen on physical exam. Negative Homan's sign. No cords or calf tenderness. No significant calf/ankle edema.   Recent Labs  12/18/13 2143 12/19/13 0515  HGB 10.5* 10.6*  HCT 30.9* 31.2*    Assessment/Plan: Status post Cesarean section. Doing well postoperatively.  Continue current care.  STINSON, JACOB JEHIEL 12/20/2013, 9:41 AM

## 2013-12-21 MED ORDER — TRIAMTERENE-HCTZ 75-50 MG PO TABS
1.0000 | ORAL_TABLET | Freq: Every day | ORAL | Status: DC
  Filled 2013-12-21: qty 1

## 2013-12-21 MED ORDER — TRIAMTERENE-HCTZ 37.5-25 MG PO TABS
2.0000 | ORAL_TABLET | Freq: Every day | ORAL | Status: DC
  Administered 2013-12-21 – 2013-12-22 (×2): 2 via ORAL
  Filled 2013-12-21 (×2): qty 2

## 2013-12-21 MED ORDER — FUROSEMIDE 10 MG/ML IJ SOLN
40.0000 mg | Freq: Once | INTRAMUSCULAR | Status: AC
  Administered 2013-12-21: 40 mg via INTRAVENOUS
  Filled 2013-12-21: qty 4

## 2013-12-21 MED ORDER — LORAZEPAM 1 MG PO TABS
1.0000 mg | ORAL_TABLET | Freq: Once | ORAL | Status: AC
  Administered 2013-12-21: 1 mg via ORAL
  Filled 2013-12-21: qty 1

## 2013-12-21 MED ORDER — HYDRALAZINE HCL 20 MG/ML IJ SOLN
10.0000 mg | INTRAMUSCULAR | Status: AC
  Administered 2013-12-21 (×6): 10 mg via INTRAVENOUS
  Filled 2013-12-21 (×3): qty 1

## 2013-12-21 NOTE — Progress Notes (Signed)
Clinical Social Work Department PSYCHOSOCIAL ASSESSMENT - MATERNAL/CHILD 12/21/2013  Patient:  Katrina Garcia,Katrina Garcia  Account Number:  [id number]  Admit Date:  12/15/2013  Childs Name:   Katrina Garcia    Clinical Social Worker:  Lebron Nauert, LCSW   Date/Time:  12/21/2013 03:00 PM  Date Referred:        Other referral source:   No referral-NICU admission  Upon chart review, CSW notes hx of Major Depressive Disorder, PTSD and Inpatient Psychiatric Hospitalization in 08/2012.    I:  FAMILY / HOME ENVIRONMENT Child's legal guardian:  PARENT  Guardian - Name Guardian - Age Guardian - Address  Marybelle [address] [place] [address]  Terrell Garcia  same   Other household support members/support persons Other support:   MOB states she and FOB have a good support system.  She states their mothers and FOB's brother are their main supports.    II  PSYCHOSOCIAL DATA Information Source:  Patient Interview  Financial and Community Resources Employment:   Financial resources:  Medicaid If Medicaid - County:  [place]  School / Grade:   Maternity Care Coordinator / Child Services Coordination / Early Interventions:  Cultural issues impacting care:   None stated    III  STRENGTHS Strengths  Adequate Resources  Compliance with medical plan  Home prepared for Child (including basic supplies)  Other - See comment  Supportive family/friends  Understanding of illness   Strength comment:  MOB states she would like to take baby to [facility] for follow up.  CSW suggests she call to inquire about whether they are taking new Medicaid patients at this time.  CSW informed her that she may get a pediatrician list from the NICU if needed.   IV  RISK FACTORS AND CURRENT PROBLEMS Current Problem:  YES   Risk Factor & Current Problem Patient Issue Family Issue Risk Factor / Current Problem Comment  Mental Illness Y N Hx of MDD, PTSD         V  SOCIAL WORK ASSESSMENT  CSW  met with MOB in her third floor room to complete assessment due to baby's NICU admission and offer support.  MOB was resting, but states she is planning to go feed baby soon, so this is a good time for CSW to visit with her.  She reports that she and baby Katrina are doing well.  She states his early arrival was completely unexpected, but that she feels it is setting in and that she is coping well.  She states she feels sad that she will have to leave him in the hospital and go home tomorrow, but again, feels she is handling things well.  CSW encouraged her to allow herself to be emotional and validated her feelings of sadness.  CSW talked in depth about feelings after birth as well as PPD signs and symptoms and when to call her doctor if she has concerns.  CSW talked about the importance of having a plan for prevention/treatment of symptoms, especially given her hx of MDD and PTSD.  MOB was fairly open about this hx and states the depressive episode she had in August of 2014 was isolated, with no concerns since.  She states she had counseling at [facility] after that brief hospitalization and then switched to seeing Nikki Caldwell.  She states she likes Nikki very much and still sees her when needed.  She was attentive to information given by CSW and commits to talking with her doctor   and counselor if emotional concerns arise.  She states she has taken medication for depression in the past, but she feels like it was numbing and didn't help her deal with her feelings so she prefers not to be on it.  She states she has all needed supplies for baby at home and that her boyfriend is very supportive.  MOB has no questions regarding baby's care in NICU.  She appears to be coping well at this time.  CSW has no other questions or concerns and identifies no barriers to discharge when baby is medically ready.  CSW provided MOB with contact information and explained ongoing support services offered by NICU  CSW.   VI SOCIAL WORK PLAN Social Work Plan  Psychosocial Support/Ongoing Assessment of Needs  Patient/Family Education   Type of pt/family education:   PPD signs and symptoms  Importance of mental health care   If child protective services report - county:   If child protective services report - date:   Information/referral to community resources comment:   No referral needs noted at this time.   Other social work plan:   

## 2013-12-21 NOTE — Progress Notes (Signed)
B/P 163/ 96, Dr. Elonda Husky notified, orders received and followed

## 2013-12-21 NOTE — Progress Notes (Addendum)
Patient snoring very loudly, foaming mucus coming from mouth.  Aroused with shaking and calling name.  Sleep apnea noted while in room.

## 2013-12-21 NOTE — Progress Notes (Signed)
   12/21/13 1500  Clinical Encounter Type  Visited With Patient not available;Health care provider   Per RN referral, Katrina Garcia had a rough night and may benefit from support/encouragement. Attempted visit twice today, but pt not in room and then sleeping.   Spiritual Care will attempt f/u tomorrow and is available 24/7; please page as needs arise.  Thank you.  [place], [place]

## 2013-12-21 NOTE — Progress Notes (Signed)
Subjective: Postpartum Day 3: Cesarean Delivery Patient reports incisional pain, tolerating PO and no problems voiding.    Objective: Vital signs in last 24 hours: Temp:  [98.1 F (36.7 C)-99.7 F (37.6 C)] 98.1 F (36.7 C) (12/17 0458) Pulse Rate:  [81-120] 98 (12/17 0601) Resp:  [18-30] 22 (12/17 0601) BP: (96-180)/(37-96) 125/49 mmHg (12/17 0601) SpO2:  [97 %-100 %] 98 % (12/17 0601) Weight:  [195 lb 12 oz (88.792 kg)] 195 lb 12 oz (88.792 kg) (12/17 0458)  Physical Exam:  General: alert, cooperative and no distress Lochia: appropriate Uterine Fundus: firm Incision: dressing dry DVT Evaluation: No evidence of DVT seen on physical exam.   Recent Labs  12/18/13 2143 12/19/13 0515  HGB 10.5* 10.6*  HCT 30.9* 31.2*    Assessment/Plan: Status post Cesarean section. Postoperative course complicated by elevated BP, received 5 total doses of apresoline last night now well below threshold, has 3+ edema will give an IV dose of lasix and begin maxzide 75/50 to complement norvasc  Continue current care Anticipate discharge tomorrow.  Kodee Drury H 12/21/2013, 7:01 AM

## 2013-12-22 ENCOUNTER — Ambulatory Visit: Payer: Self-pay

## 2013-12-22 DIAGNOSIS — O163 Unspecified maternal hypertension, third trimester: Secondary | ICD-10-CM

## 2013-12-22 MED ORDER — OXYCODONE-ACETAMINOPHEN 5-325 MG PO TABS: 1.0000 | ORAL_TABLET | ORAL | Status: DC | PRN

## 2013-12-22 MED ORDER — AMLODIPINE BESYLATE 10 MG PO TABS: 10.0000 mg | ORAL_TABLET | Freq: Every day | ORAL | Status: DC

## 2013-12-22 MED ORDER — DOCUSATE SODIUM 100 MG PO CAPS: 100.0000 mg | ORAL_CAPSULE | Freq: Two times a day (BID) | ORAL | Status: DC | PRN

## 2013-12-22 MED ORDER — TRIAMTERENE-HCTZ 37.5-25 MG PO TABS: 2.0000 | ORAL_TABLET | Freq: Every day | ORAL | Status: DC

## 2013-12-22 MED ORDER — IBUPROFEN 600 MG PO TABS: 600.0000 mg | ORAL_TABLET | Freq: Four times a day (QID) | ORAL | Status: DC

## 2013-12-22 NOTE — Discharge Instructions (Signed)

## 2013-12-22 NOTE — Lactation Note (Signed)
This note was copied from the chart of [place]. Lactation Consultation Note  Patient Name: Katrina Garcia [id number] Date: 12/22/2013   Mom was discharged today.  Left before being seen by LC.  RN stated mom's milk is coming-in with a pumping session of 45 ml.  RN stated mom has a DEBP at home for home use.    Katrina Garcia 12/22/2013, 12:48 PM

## 2013-12-22 NOTE — Progress Notes (Signed)
Pt discharged to home with significant other.  Condition stable.  Pt ambulated to NICU with plans to leave hospital from there.  Teach back performed regarding symptoms of high blood pressure and how to take her medicines for hypertension.  No equipment for home ordered at discharge.

## 2013-12-22 NOTE — Discharge Summary (Signed)
Obstetric Discharge Summary Reason for Admission: induction of labor secondary to gestational hypertension Prenatal Procedures: none Intrapartum Procedures: cesarean: low cervical, transverse secondary to failure to progress in the setting of severe preeclampsia Postpartum Procedures: none Complications-Operative and Postpartum: none HEMOGLOBIN  Date Value Ref Range Status  12/19/2013 10.6* 12.0 - 15.0 g/dL Final   HCT  Date Value Ref Range Status  12/19/2013 31.2* 36.0 - 46.0 % Final    Physical Exam:  General: alert, cooperative and no distress Lochia: appropriate Uterine Fundus: firm, NT Incision: no significant drainage, honeycomb dressing clean, dry and intact DVT Evaluation: No evidence of DVT seen on physical exam. No cords or calf tenderness.  Discharge Diagnoses: Preelampsia  Discharge Information: Date: 12/22/2013 Activity: pelvic rest Diet: routine Medications: Ibuprofen, Colace, Percocet and antihypertensive Condition: stable Instructions: refer to practice specific booklet Discharge to: home Follow-up Information    [facility] up with [facility]   Specialty:  Obstetrics and Gynecology   Why:  Call and make an appointment to follow up in 2 weeks   Contact information:   [place] [phone]      Newborn Data: Live born female  Birth Weight: 5 lb 12.8 oz (2631 g) APGAR: 3, 7  Will remain in NICU until feeding improves  Katrina Garcia 12/22/2013, 7:06 AM

## 2014-01-16 ENCOUNTER — Encounter: Payer: Self-pay | Admitting: General Practice

## 2014-01-22 ENCOUNTER — Encounter: Payer: Self-pay | Admitting: Nurse Practitioner

## 2014-01-22 ENCOUNTER — Ambulatory Visit (INDEPENDENT_AMBULATORY_CARE_PROVIDER_SITE_OTHER): Payer: Medicaid Other | Admitting: Nurse Practitioner

## 2014-01-22 DIAGNOSIS — I10 Essential (primary) hypertension: Secondary | ICD-10-CM

## 2014-01-22 DIAGNOSIS — Z309 Encounter for contraceptive management, unspecified: Secondary | ICD-10-CM

## 2014-01-22 MED ORDER — ETONOGESTREL-ETHINYL ESTRADIOL 0.12-0.015 MG/24HR VA RING: VAGINAL_RING | VAGINAL | Status: DC

## 2014-01-22 MED ORDER — TRIAMTERENE-HCTZ 37.5-25 MG PO TABS: 2.0000 | ORAL_TABLET | Freq: Every day | ORAL | Status: DC

## 2014-01-22 NOTE — Patient Instructions (Signed)
Contraceptive Barrier Methods A barrier method is a type of birth control (contraception) that is used to prevent pregnancy. These methods include:   Female condom.   Female condom.   Diaphragm.   Cervical cap.   Sponge.   Spermicide.  Your health care provider can help you decide what form of contraception is best for you. Always keep in mind the risks of sexually transmitted infections (STIs).  FEMALE CONDOM A female condom is a thin sheath (latex or rubber) that is worn over the penis during sexual intercourse. The condom prevents pregnancy by catching and stopping the sperm from reaching the uterus. Condoms may come with a spermicide on them, and they can only be worn once. Condoms should not be used with petroleum jelly, lotions, or oils. These things decrease their effectiveness. Condoms can be used with water-based lubricants. Condoms help protect against STIs. Latex and polyurethane condoms provide the best available protection against many STIs, including HIV.  FEMALE CONDOM The female condom is a soft, loose-fitting sheath that is put into the vagina before sexual intercourse. It prevents pregnancy by catching the sperm in the condom and blocking the passage of sperm to the uterus. It is intended for one-time use only. A female partner should not use a condom at the same time. The female and female condoms may stick together and break. A female condom can be inserted as long as 8 hours before intercourse. Condoms help protect against STIs.  DIAPHRAGM A diaphragm is a soft, latex, dome-shaped barrier that is placed in the vagina with spermicidal jelly before sexual intercourse. It covers the cervix, kills sperm, and blocks the passage of semen into the cervix. The diaphragm can be inserted up to 2 hours before sex. If it is inserted more than 2 hours before intercourse, then spermicide must be applied again. The diaphragm should be left in the vagina for 6-8 hours after intercourse. It must  be fitted by a health care provider. This method does not protect against STIs.  CERVICAL CAP A cervical cap is a round, soft, latex or plastic cup that is put in the vagina and fits over the cervix. It may be inserted as long as 6 hours before sexual activity. It must be left in place for at least 6 hours after intercourse and can be left in place for as long as 48 hours. It provides continuous protection as long as it is in place, regardless of the number of intercourse acts. The cervical cap cannot be used during your period. It must be fitted by a health care provider. Cervical caps do not protect against STIs. SPONGE A sponge is a soft, circular piece of polyurethane foam that has spermicide in it. The sponge has a loop for removal. It is inserted into the vagina after wetting it and is placed over the cervix before sexual intercourse. The foam is designed to trap and absorb sperm before it enters the cervix. The spermicide kills or immobilizes sperm. The sponge offers an immediate and continuous presence of spermicide throughout a 24-hour period regardless of the number of intercourse acts. The sponge should be left in place for at least 6 hours after sex. It should not be left in for more than 24 hours, and it cannot be reused. The sponge does not protect against STIs. SPERMICIDES Spermicides are chemicals that kill or block sperm from entering the cervix and uterus. They come in the form of creams, jellies, suppositories, foam, film, or tablets. The film, tablets, and  suppositories should be inserted 10 to 30 minutes before sexual intercourse so they can dissolve. They are inserted into the vagina with an applicator before having sexual intercourse. This must be repeated every time you have sexual intercourse. The use of spermicides does not protect against STIs. Document Released: 10/19/2006 Document Revised: 08/24/2012 Document Reviewed: 06/05/2012 [facility] Patient Information 2015 [place], [place].  This information is not intended to replace advice given to you by your health care provider. Make sure you discuss any questions you have with your health care provider. Hypertension Hypertension, commonly called high blood pressure, is when the force of blood pumping through your arteries is too strong. Your arteries are the blood vessels that carry blood from your heart throughout your body. A blood pressure reading consists of a higher number over a lower number, such as 110/72. The higher number (systolic) is the pressure inside your arteries when your heart pumps. The lower number (diastolic) is the pressure inside your arteries when your heart relaxes. Ideally you want your blood pressure below 120/80. Hypertension forces your heart to work harder to pump blood. Your arteries may become narrow or stiff. Having hypertension puts you at risk for heart disease, stroke, and other problems.  RISK FACTORS Some risk factors for high blood pressure are controllable. Others are not.  Risk factors you cannot control include:   Race. You may be at higher risk if you are African American.  Age. Risk increases with age.  Gender. Men are at higher risk than women before age 56 years. After age 37, women are at higher risk than men. Risk factors you can control include:  Not getting enough exercise or physical activity.  Being overweight.  Getting too much fat, sugar, calories, or salt in your diet.  Drinking too much alcohol. SIGNS AND SYMPTOMS Hypertension does not usually cause signs or symptoms. Extremely high blood pressure (hypertensive crisis) may cause headache, anxiety, shortness of breath, and nosebleed. DIAGNOSIS  To check if you have hypertension, your health care provider will measure your blood pressure while you are seated, with your arm held at the level of your heart. It should be measured at least twice using the same arm. Certain conditions can cause a difference in blood pressure  between your right and left arms. A blood pressure reading that is higher than normal on one occasion does not mean that you need treatment. If one blood pressure reading is high, ask your health care provider about having it checked again. TREATMENT  Treating high blood pressure includes making lifestyle changes and possibly taking medicine. Living a healthy lifestyle can help lower high blood pressure. You may need to change some of your habits. Lifestyle changes may include:  Following the DASH diet. This diet is high in fruits, vegetables, and whole grains. It is low in salt, red meat, and added sugars.  Getting at least 2 hours of brisk physical activity every week.  Losing weight if necessary.  Not smoking.  Limiting alcoholic beverages.  Learning ways to reduce stress. If lifestyle changes are not enough to get your blood pressure under control, your health care provider may prescribe medicine. You may need to take more than one. Work closely with your health care provider to understand the risks and benefits. HOME CARE INSTRUCTIONS  Have your blood pressure rechecked as directed by your health care provider.   Take medicines only as directed by your health care provider. Follow the directions carefully. Blood pressure medicines must be taken as  prescribed. The medicine does not work as well when you skip doses. Skipping doses also puts you at risk for problems.   Do not smoke.   Monitor your blood pressure at home as directed by your health care provider. SEEK MEDICAL CARE IF:   You think you are having a reaction to medicines taken.  You have recurrent headaches or feel dizzy.  You have swelling in your ankles.  You have trouble with your vision. SEEK IMMEDIATE MEDICAL CARE IF:  You develop a severe headache or confusion.  You have unusual weakness, numbness, or feel faint.  You have severe chest or abdominal pain.  You vomit repeatedly.  You have trouble  breathing. MAKE SURE YOU:   Understand these instructions.  Will watch your condition.  Will get help right away if you are not doing well or get worse. Document Released: 12/22/2004 Document Revised: 05/08/2013 Document Reviewed: 10/14/2012 [facility] Patient Information 2015 [place], [place]. This information is not intended to replace advice given to you by your health care provider. Make sure you discuss any questions you have with your health care provider. Postpartum Care After Cesarean Delivery After you deliver your newborn (postpartum period), the usual stay in the hospital is 24-72 hours. If there were problems with your labor or delivery, or if you have other medical problems, you might be in the hospital longer.  While you are in the hospital, you will receive help and instructions on how to care for yourself and your newborn during the postpartum period.  While you are in the hospital:  It is normal for you to have pain or discomfort from the incision in your abdomen. Be sure to tell your nurses when you are having pain, where the pain is located, and what makes the pain worse.  If you are breastfeeding, you may feel uncomfortable contractions of your uterus for a couple of weeks. This is normal. The contractions help your uterus get back to normal size.  It is normal to have some bleeding after delivery.  For the first 1-3 days after delivery, the flow is red and the amount may be similar to a period.  It is common for the flow to start and stop.  In the first few days, you may pass some small clots. Let your nurses know if you begin to pass large clots or your flow increases.  Do not  flush blood clots down the toilet before having the nurse look at them.  During the next 3-10 days after delivery, your flow should become more watery and pink or brown-tinged in color.  Ten to fourteen days after delivery, your flow should be a small amount of yellowish-white  discharge.  The amount of your flow will decrease over the first few weeks after delivery. Your flow may stop in 6-8 weeks. Most women have had their flow stop by 12 weeks after delivery.  You should change your sanitary pads frequently.  Wash your hands thoroughly with soap and water for at least 20 seconds after changing pads, using the toilet, or before holding or feeding your newborn.  Your intravenous (IV) tubing will be removed when you are drinking enough fluids.  The urine drainage tube (urinary catheter) that was inserted before delivery may be removed within 6-8 hours after delivery or when feeling returns to your legs. You should feel like you need to empty your bladder within the first 6-8 hours after the catheter has been removed.  In case you become weak, lightheaded,  or faint, call your nurse before you get out of bed for the first time and before you take a shower for the first time.  Within the first few days after delivery, your breasts may begin to feel tender and full. This is called engorgement. Breast tenderness usually goes away within 48-72 hours after engorgement occurs. You may also notice milk leaking from your breasts. If you are not breastfeeding, do not stimulate your breasts. Breast stimulation can make your breasts produce more milk.  Spending as much time as possible with your newborn is very important. During this time, you and your newborn can feel close and get to know each other. Having your newborn stay in your room (rooming in) will help to strengthen the bond with your newborn. It will give you time to get to know your newborn and become comfortable caring for your newborn.  Your hormones change after delivery. Sometimes the hormone changes can temporarily cause you to feel sad or tearful. These feelings should not last more than a few days. If these feelings last longer than that, you should talk to your caregiver.  If desired, talk to your caregiver about  methods of family planning or contraception.  Talk to your caregiver about immunizations. Your caregiver may want you to have the following immunizations before leaving the hospital:  Tetanus, diphtheria, and pertussis (Tdap) or tetanus and diphtheria (Td) immunization. It is very important that you and your family (including grandparents) or others caring for your newborn are up-to-date with the Tdap or Td immunizations. The Tdap or Td immunization can help protect your newborn from getting ill.  Rubella immunization.  Varicella (chickenpox) immunization.  Influenza immunization. You should receive this annual immunization if you did not receive the immunization during your pregnancy. Document Released: 09/16/2011 Document Reviewed: 09/16/2011 [facility] Patient Information 2015 [place]. This information is not intended to replace advice given to you by your health care provider. Make sure you discuss any questions you have with your health care provider.

## 2014-01-22 NOTE — Progress Notes (Signed)
Patient ID: Katrina Garcia, female   DOB: 08/13/1985, 29 y.o.   MRN: [id number] Subjective:     Katrina Garcia is a 29 y.o. female who presents for a postpartum visit. She is 4 week postpartum following a low cervical transverse Cesarean section. I have fully reviewed the prenatal and intrapartum course. The delivery was at 34 weeks and 5 days gestational weeks. Outcome: primary cesarean section, low transverse incision. Anesthesia: epidural and spinal. Postpartum course has been hypertension not taking medications routinely, denies any postpartum depression. 91 course has been Neonatal ICU for 13 days for prematurity. Baby is feeding by bottle - Similac Neosure. Bleeding no bleeding. Bowel function is normal. Bladder function is normal. Patient is sexually active. Contraception method is NuvaRing vaginal inserts. Postpartum depression screening: negative.  The following portions of the patient's history were reviewed and updated as appropriate: current medications, past family history, past medical history, past social history, past surgical history and problem list.  Review of Systems Pertinent items are noted in HPI.   Objective:    BP 148/86 mmHg  Pulse 65  Temp(Src) 98.3 F (36.8 C)  Wt 182 lb 4.8 oz (82.691 kg)  Breastfeeding? No  General:  alert, cooperative and appears stated age   Breasts:  inspection negative, no nipple discharge or bleeding, no masses or nodularity palpable and not examined  Lungs: clear to auscultation bilaterally  Heart:  regular rate and rhythm, S1, S2 normal, no murmur, click, rub or gallop  Abdomen: soft, non-tender; bowel sounds normal; no masses,  no organomegalyWell healed C/S scar   Vulva:  not evaluated  Vagina: not evaluated  Cervix:  not examined  Corpus: not examined  Adnexa:  not evaluated  Rectal Exam: Not performed.        Assessment:   postpartum exam. Pap smear not done at today's visit.   Plan:    1. Contraception: NuvaRing vaginal  inserts 2. Hypertension/ refilled Maxzide x 3 months Refer to PCP to manage Hypertension FMLA forms  3. Follow up in: 1 year or as needed.

## 2014-01-23 ENCOUNTER — Telehealth: Payer: Self-pay

## 2014-01-23 NOTE — Telephone Encounter (Signed)
FMLA/Disability paperwork completed and faxed to [facility] [phone]. Copy made and placed in front office file holder for patient to obtain. Called patient-- no answer-- left message informing her paperwork has been completed and faxed. Paperwork to be scanned into file.

## 2014-06-21 ENCOUNTER — Encounter (HOSPITAL_COMMUNITY): Payer: Self-pay

## 2014-06-21 ENCOUNTER — Emergency Department (HOSPITAL_COMMUNITY): Payer: Medicaid Other

## 2014-06-21 ENCOUNTER — Emergency Department (HOSPITAL_COMMUNITY)
Admission: EM | Admit: 2014-06-21 | Discharge: 2014-06-21 | Disposition: A | Discharge status: Home / Self Care | Payer: Self-pay | Attending: Emergency Medicine | Admitting: Emergency Medicine

## 2014-06-21 DIAGNOSIS — Z3202 Encounter for pregnancy test, result negative: Secondary | ICD-10-CM | POA: Insufficient documentation

## 2014-06-21 DIAGNOSIS — Z79899 Other long term (current) drug therapy: Secondary | ICD-10-CM | POA: Insufficient documentation

## 2014-06-21 DIAGNOSIS — Z8619 Personal history of other infectious and parasitic diseases: Secondary | ICD-10-CM | POA: Insufficient documentation

## 2014-06-21 DIAGNOSIS — Z8659 Personal history of other mental and behavioral disorders: Secondary | ICD-10-CM | POA: Insufficient documentation

## 2014-06-21 DIAGNOSIS — R2240 Localized swelling, mass and lump, unspecified lower limb: Secondary | ICD-10-CM | POA: Insufficient documentation

## 2014-06-21 DIAGNOSIS — R1011 Right upper quadrant pain: Secondary | ICD-10-CM | POA: Insufficient documentation

## 2014-06-21 DIAGNOSIS — Z86018 Personal history of other benign neoplasm: Secondary | ICD-10-CM | POA: Insufficient documentation

## 2014-06-21 DIAGNOSIS — Z793 Long term (current) use of hormonal contraceptives: Secondary | ICD-10-CM | POA: Insufficient documentation

## 2014-06-21 DIAGNOSIS — Z87891 Personal history of nicotine dependence: Secondary | ICD-10-CM | POA: Insufficient documentation

## 2014-06-21 DIAGNOSIS — R0789 Other chest pain: Secondary | ICD-10-CM | POA: Insufficient documentation

## 2014-06-21 DIAGNOSIS — R1013 Epigastric pain: Secondary | ICD-10-CM | POA: Insufficient documentation

## 2014-06-21 LAB — COMPREHENSIVE METABOLIC PANEL
ALBUMIN: 4.1 g/dL (ref 3.5–5.0)
ALT: 17 U/L (ref 14–54)
ANION GAP: 8 (ref 5–15)
AST: 19 U/L (ref 15–41)
Alkaline Phosphatase: 72 U/L (ref 38–126)
BUN: 13 mg/dL (ref 6–20)
CALCIUM: 9 mg/dL (ref 8.9–10.3)
CO2: 26 mmol/L (ref 22–32)
Chloride: 104 mmol/L (ref 101–111)
Creatinine, Ser: 0.72 mg/dL (ref 0.44–1.00)
GFR calc Af Amer: >60 mL/min (ref >60)
GFR calc non Af Amer: >60 mL/min (ref >60)
Glucose, Bld: 110 mg/dL — ABNORMAL HIGH (ref 65–99)
Potassium: 3.8 mmol/L (ref 3.5–5.1)
SODIUM: 138 mmol/L (ref 135–145)
TOTAL PROTEIN: 7.3 g/dL (ref 6.5–8.1)
Total Bilirubin: 0.3 mg/dL (ref 0.3–1.2)

## 2014-06-21 LAB — CBC WITH DIFFERENTIAL/PLATELET
Basophils Absolute: 0 10*3/uL (ref 0.0–0.1)
Basophils Relative: 0 % (ref 0–1)
Eosinophils Absolute: 0.2 10*3/uL (ref 0.0–0.7)
Eosinophils Relative: 2 % (ref 0–5)
HCT: 35.4 % — ABNORMAL LOW (ref 36.0–46.0)
Hemoglobin: 11.3 g/dL — ABNORMAL LOW (ref 12.0–15.0)
LYMPHS ABS: 2.1 10*3/uL (ref 0.7–4.0)
LYMPHS PCT: 31 % (ref 12–46)
MCH: 25.3 pg — ABNORMAL LOW (ref 26.0–34.0)
MCHC: 31.9 g/dL (ref 30.0–36.0)
MCV: 79.2 fL (ref 78.0–100.0)
Monocytes Absolute: 0.5 10*3/uL (ref 0.1–1.0)
Monocytes Relative: 7 % (ref 3–12)
NEUTROS PCT: 60 % (ref 43–77)
Neutro Abs: 4.1 10*3/uL (ref 1.7–7.7)
Platelets: 281 10*3/uL (ref 150–400)
RBC: 4.47 MIL/uL (ref 3.87–5.11)
RDW: 13.6 % (ref 11.5–15.5)
WBC: 6.9 10*3/uL (ref 4.0–10.5)

## 2014-06-21 LAB — URINALYSIS, ROUTINE W REFLEX MICROSCOPIC
Bilirubin Urine: NEGATIVE
Glucose, UA: NEGATIVE mg/dL
Hgb urine dipstick: NEGATIVE
KETONES UR: NEGATIVE mg/dL
LEUKOCYTES UA: NEGATIVE
NITRITE: NEGATIVE
PROTEIN: NEGATIVE mg/dL
Specific Gravity, Urine: 1.028 (ref 1.005–1.030)
Urobilinogen, UA: 0.2 mg/dL (ref 0.0–1.0)
pH: 6 (ref 5.0–8.0)

## 2014-06-21 LAB — I-STAT TROPONIN, ED: Troponin i, poc: 0.01 ng/mL (ref 0.00–0.08)

## 2014-06-21 LAB — LIPASE, BLOOD: Lipase: 30 U/L (ref 22–51)

## 2014-06-21 LAB — POC URINE PREG, ED: Preg Test, Ur: NEGATIVE

## 2014-06-21 LAB — D-DIMER, QUANTITATIVE: D-Dimer, Quant: <0.27 ug/mL-FEU (ref 0.00–0.48)

## 2014-06-21 MED ORDER — KETOROLAC TROMETHAMINE 15 MG/ML IJ SOLN
15.0000 mg | Freq: Once | INTRAMUSCULAR | Status: AC
  Administered 2014-06-21: 15 mg via INTRAVENOUS
  Filled 2014-06-21: qty 1

## 2014-06-21 NOTE — Discharge Instructions (Signed)
Take aleve, available over the counter, one tablet by mouth 1-2 times daily for the next five days.   Costochondritis Costochondritis, sometimes called Tietze syndrome, is a swelling and irritation (inflammation) of the tissue (cartilage) that connects your ribs with your breastbone (sternum). It causes pain in the chest and rib area. Costochondritis usually goes away on its own over time. It can take up to 6 weeks or longer to get better, especially if you are unable to limit your activities. CAUSES  Some cases of costochondritis have no known cause. Possible causes include:  Injury (trauma).  Exercise or activity such as lifting.  Severe coughing. SIGNS AND SYMPTOMS  Pain and tenderness in the chest and rib area.  Pain that gets worse when coughing or taking deep breaths.  Pain that gets worse with specific movements. DIAGNOSIS  Your health care provider will do a physical exam and ask about your symptoms. Chest X-rays or other tests may be done to rule out other problems. TREATMENT  Costochondritis usually goes away on its own over time. Your health care provider may prescribe medicine to help relieve pain. HOME CARE INSTRUCTIONS   Avoid exhausting physical activity. Try not to strain your ribs during normal activity. This would include any activities using chest, abdominal, and side muscles, especially if heavy weights are used.  Apply ice to the affected area for the first 2 days after the pain begins.  Put ice in a plastic bag.  Place a towel between your skin and the bag.  Leave the ice on for 20 minutes, 2-3 times a day.  Only take over-the-counter or prescription medicines as directed by your health care provider. SEEK MEDICAL CARE IF:  You have redness or swelling at the rib joints. These are signs of infection.  Your pain does not go away despite rest or medicine. SEEK IMMEDIATE MEDICAL CARE IF:   Your pain increases or you are very uncomfortable.  You have  shortness of breath or difficulty breathing.  You cough up blood.  You have worse chest pains, sweating, or vomiting.  You have a fever or persistent symptoms for more than 2-3 days.  You have a fever and your symptoms suddenly get worse. MAKE SURE YOU:   Understand these instructions.  Will watch your condition.  Will get help right away if you are not doing well or get worse. Document Released: 10/01/2004 Document Revised: 10/12/2012 Document Reviewed: 07/26/2012 [facility] Patient Information 2015 [place], [place]. This information is not intended to replace advice given to you by your health care provider. Make sure you discuss any questions you have with your health care provider.

## 2014-06-21 NOTE — ED Notes (Signed)
Pt alert and oriented x4. Respirations even and unlabored, bilateral symmetrical rise and fall of chest. Skin warm and dry. In no acute distress. Denies needs.   Pt reports she is unable to void at this time

## 2014-06-21 NOTE — ED Notes (Signed)
Pt escorted to discharge window. Pt verbalized understanding discharge instructions. In no acute distress.  

## 2014-06-21 NOTE — ED Notes (Signed)
Patient reports intermittent right-sided chest pain x 5 days.  Denies N/V/shortness of breath.

## 2014-06-21 NOTE — ED Provider Notes (Signed)
CSN: [id number]     Arrival date & time 06/21/14  0549 History   First MD Initiated Contact with Patient 06/21/14 [phone]     Chief Complaint  Patient presents with  . Chest Pain     Patient is a 29 y.o. female presenting with chest pain. The history is provided by the patient. No language interpreter was used.  Chest Pain  Ms. Sida presents for evaluation of chest pain. She's had 5 days of central and right-sided chest pain that is constant nature. It is described as burning and pressure. It awoke her from sleep 5 days ago with shortness of breath. The pain is worse with activity. She has some mild epigastric and right upper quadrant pain as well but these are minimal. She has some lower extremity swelling that is improving for her. She is 6 months postpartum following a cesarean delivery for preeclampsia. She is still on her blood pressure medications following that delivery. She takes HCTZ and amlodipine. She has no history of heart disease or DVT/PE. She does not take any oral estrogens. Symptoms are moderate, constant.  Past Medical History  Diagnosis Date  . Fibroid   . HPV (human papilloma virus) infection   . MDD (major depressive disorder)   . PTSD (post-traumatic stress disorder)   . Fibroid uterus 07/29/2013    Left subserosal 3.5 x 3.1 x 2.5 Left Intramural 1.2 x 1.3 x 1.4    . Pregnancy induced hypertension    Past Surgical History  Procedure Laterality Date  . Wisdom tooth extraction    . Cesarean section N/A 12/18/2013    Procedure: CESAREAN SECTION;  Surgeon: Truett Mainland, DO;  Location: [facility];  Service: Obstetrics;  Laterality: N/A;   No family history on file. History  Substance Use Topics  . Smoking status: Former Smoker    Types: Cigarettes    Quit date: 09/06/2011  . Smokeless tobacco: Never Used  . Alcohol Use: No   OB History    Gravida Para Term Preterm AB TAB SAB Ectopic Multiple Living   1 1  1      0 1     Review of Systems  Cardiovascular:  Positive for chest pain.  All other systems reviewed and are negative.     Allergies  Review of patient's allergies indicates no known allergies.  Home Medications   Prior to Admission medications   Medication Sig Start Date End Date Taking? Authorizing Provider  HYDROCHLOROTHIAZIDE PO Take 1 tablet by mouth 2 (two) times daily.   Yes Historical Provider, MD  amLODipine (NORVASC) 10 MG tablet Take 1 tablet (10 mg total) by mouth daily. Patient not taking: Reported on 01/22/2014 12/22/13   Mora Bellman, MD  docusate sodium (COLACE) 100 MG capsule Take 1 capsule (100 mg total) by mouth 2 (two) times daily as needed. Patient not taking: Reported on 01/22/2014 12/22/13   Mora Bellman, MD  etonogestrel-ethinyl estradiol (NUVARING) 0.12-0.015 MG/24HR vaginal ring Insert vaginally and leave in place for 3 consecutive weeks, then remove for 1 week. Patient not taking: Reported on 06/21/2014 01/22/14   Olegario Messier, NP  ibuprofen (ADVIL,MOTRIN) 600 MG tablet Take 1 tablet (600 mg total) by mouth every 6 (six) hours. Patient not taking: Reported on 01/22/2014 12/22/13   Mora Bellman, MD  oxyCODONE-acetaminophen (PERCOCET/ROXICET) 5-325 MG per tablet Take 1 tablet by mouth every 4 (four) hours as needed (for pain scale less than 7). Patient not taking: Reported on 01/22/2014 12/22/13   Peggy Constant,  MD  triamterene-hydrochlorothiazide (MAXZIDE-25) 37.5-25 MG per tablet Take 2 tablets by mouth daily. Patient not taking: Reported on 06/21/2014 01/22/14   Olegario Messier, NP   BP 130/81 mmHg  Pulse 71  Temp(Src) 98 F (36.7 C) (Oral)  Resp 13  SpO2 100%  LMP 05/30/2014 (Exact Date)  Breastfeeding? No Physical Exam  Constitutional: She is oriented to person, place, and time. She appears well-developed and well-nourished.  HENT:  Head: Normocephalic and atraumatic.  Cardiovascular: Normal rate and regular rhythm.   No murmur heard. Pulmonary/Chest: Effort normal and breath sounds  normal. No respiratory distress.  Abdominal: Soft. There is no rebound and no guarding.  Mild RUQ/epigastric tenderness  Musculoskeletal: She exhibits no edema or tenderness.  Neurological: She is alert and oriented to person, place, and time.  Skin: Skin is warm and dry.  Psychiatric: She has a normal mood and affect. Her behavior is normal.  Nursing note and vitals reviewed.   ED Course  Procedures (including critical care time) Labs Review Labs Reviewed  URINALYSIS, ROUTINE W REFLEX MICROSCOPIC (NOT AT [facility]) - Abnormal; Notable for the following:    APPearance CLOUDY (*)    All other components within normal limits  COMPREHENSIVE METABOLIC PANEL - Abnormal; Notable for the following:    Glucose, Bld 110 (*)    All other components within normal limits  CBC WITH DIFFERENTIAL/PLATELET - Abnormal; Notable for the following:    Hemoglobin 11.3 (*)    HCT 35.4 (*)    MCH 25.3 (*)    All other components within normal limits  D-DIMER, QUANTITATIVE (NOT AT [facility])  LIPASE, BLOOD  POC URINE PREG, ED  Randolm Idol, ED    Imaging Review Dg Chest 2 View  06/21/2014   CLINICAL DATA:  Centralized chest pain and hypertension  EXAM: CHEST  2 VIEW  COMPARISON:  None.  FINDINGS: Lungs are clear. Heart size and pulmonary vascularity are normal. No adenopathy. No bone lesions.  IMPRESSION: No abnormality noted.   Electronically Signed   By: Lowella Grip III M.D.   On: 06/21/2014 09:55     EKG Interpretation   Date/Time:  Thursday June 21 2014 06:02:12 EDT Ventricular Rate:  66 PR Interval:  142 QRS Duration: 91 QT Interval:  393 QTC Calculation: 412 R Axis:   59 Text Interpretation:  Sinus rhythm Confirmed by Hazle Coca [phone]) on  06/21/2014 7:07:34 AM      MDM   Final diagnoses:  Chest wall pain    Patient here for evaluation of chest pain. The patient is in no distress in the emergency department. Pain is partially improved after Toradol. Strain presentation is not  consistent with cardiomyopathy, pericarditis, myocarditis, ACS, PE, dissection, cholecystitis. Discussed with patient and care for chest wall pain/costochondritis with outpatient follow-up. Return precautions were discussed.    Quintella Reichert, MD 06/21/14 1027

## 2014-06-21 NOTE — ED Notes (Signed)
Asked pt to provide a urine sample and she stated she was unable to provide one at this time

## 2014-10-07 ENCOUNTER — Encounter (HOSPITAL_COMMUNITY): Payer: Self-pay | Admitting: Oncology

## 2014-10-07 ENCOUNTER — Emergency Department (HOSPITAL_COMMUNITY)
Admission: EM | Admit: 2014-10-07 | Discharge: 2014-10-07 | Disposition: A | Discharge status: Home / Self Care | Payer: Medicaid Other | Attending: Emergency Medicine | Admitting: Emergency Medicine

## 2014-10-07 ENCOUNTER — Emergency Department (HOSPITAL_COMMUNITY): Payer: Medicaid Other

## 2014-10-07 DIAGNOSIS — R109 Unspecified abdominal pain: Secondary | ICD-10-CM

## 2014-10-07 DIAGNOSIS — Z79899 Other long term (current) drug therapy: Secondary | ICD-10-CM | POA: Insufficient documentation

## 2014-10-07 DIAGNOSIS — Z8619 Personal history of other infectious and parasitic diseases: Secondary | ICD-10-CM | POA: Insufficient documentation

## 2014-10-07 DIAGNOSIS — Z86018 Personal history of other benign neoplasm: Secondary | ICD-10-CM | POA: Insufficient documentation

## 2014-10-07 DIAGNOSIS — R079 Chest pain, unspecified: Secondary | ICD-10-CM

## 2014-10-07 DIAGNOSIS — Z8659 Personal history of other mental and behavioral disorders: Secondary | ICD-10-CM | POA: Insufficient documentation

## 2014-10-07 DIAGNOSIS — Z87891 Personal history of nicotine dependence: Secondary | ICD-10-CM | POA: Insufficient documentation

## 2014-10-07 LAB — CBC
HCT: 41.2 % (ref 36.0–46.0)
Hemoglobin: 13.7 g/dL (ref 12.0–15.0)
MCH: 25.8 pg — ABNORMAL LOW (ref 26.0–34.0)
MCHC: 33.3 g/dL (ref 30.0–36.0)
MCV: 77.6 fL — ABNORMAL LOW (ref 78.0–100.0)
Platelets: 383 10*3/uL (ref 150–400)
RBC: 5.31 MIL/uL — ABNORMAL HIGH (ref 3.87–5.11)
RDW: 13.3 % (ref 11.5–15.5)
WBC: 9.2 10*3/uL (ref 4.0–10.5)

## 2014-10-07 LAB — BASIC METABOLIC PANEL
Anion gap: 14 (ref 5–15)
BUN: 16 mg/dL (ref 6–20)
CO2: 20 mmol/L — ABNORMAL LOW (ref 22–32)
Calcium: 9.7 mg/dL (ref 8.9–10.3)
Chloride: 102 mmol/L (ref 101–111)
Creatinine, Ser: 0.89 mg/dL (ref 0.44–1.00)
GFR calc Af Amer: >60 mL/min (ref >60)
GFR calc non Af Amer: >60 mL/min (ref >60)
Glucose, Bld: 101 mg/dL — ABNORMAL HIGH (ref 65–99)
Potassium: 3.9 mmol/L (ref 3.5–5.1)
Sodium: 136 mmol/L (ref 135–145)

## 2014-10-07 LAB — I-STAT TROPONIN, ED: Troponin i, poc: 0 ng/mL (ref 0.00–0.08)

## 2014-10-07 MED ORDER — DICYCLOMINE HCL 20 MG PO TABS: 20.0000 mg | ORAL_TABLET | Freq: Four times a day (QID) | ORAL | Status: DC | PRN

## 2014-10-07 MED ORDER — DICYCLOMINE HCL 20 MG PO TABS
20.0000 mg | ORAL_TABLET | Freq: Once | ORAL | Status: AC
  Administered 2014-10-07: 20 mg via ORAL
  Filled 2014-10-07: qty 1

## 2014-10-07 MED ORDER — IBUPROFEN 200 MG PO TABS
400.0000 mg | ORAL_TABLET | Freq: Once | ORAL | Status: AC
  Administered 2014-10-07: 400 mg via ORAL
  Filled 2014-10-07: qty 2

## 2014-10-07 NOTE — ED Notes (Signed)
Pt given discharge instructions, verbalized understanding of need to follow up, reasons to return to the ED and medications to take at home. Pt denies further questions or concerns at discharge. VSS. Pt ambulated to exit with family, without difficulty.

## 2014-10-07 NOTE — ED Notes (Signed)
Per pt she began to have epigastric pain, now pain is in central chest.  Pt denies cocaine use or family hx of early MI.  Pt has been seen for this in the past and given the dx of chest wall pain.

## 2014-10-07 NOTE — Discharge Instructions (Signed)
Abdominal Pain, Women Abdominal (stomach, pelvic, or belly) pain can be caused by many things. It is important to tell your doctor:  The location of the pain.  Does it come and go or is it present all the time?  Are there things that start the pain (eating certain foods, exercise)?  Are there other symptoms associated with the pain (fever, nausea, vomiting, diarrhea)? All of this is helpful to know when trying to find the cause of the pain. CAUSES   Stomach: virus or bacteria infection, or ulcer.  Intestine: appendicitis (inflamed appendix), regional ileitis (Crohn's disease), ulcerative colitis (inflamed colon), irritable bowel syndrome, diverticulitis (inflamed diverticulum of the colon), or cancer of the stomach or intestine.  Gallbladder disease or stones in the gallbladder.  Kidney disease, kidney stones, or infection.  Pancreas infection or cancer.  Fibromyalgia (pain disorder).  Diseases of the female organs:  Uterus: fibroid (non-cancerous) tumors or infection.  Fallopian tubes: infection or tubal pregnancy.  Ovary: cysts or tumors.  Pelvic adhesions (scar tissue).  Endometriosis (uterus lining tissue growing in the pelvis and on the pelvic organs).  Pelvic congestion syndrome (female organs filling up with blood just before the menstrual period).  Pain with the menstrual period.  Pain with ovulation (producing an egg).  Pain with an IUD (intrauterine device, birth control) in the uterus.  Cancer of the female organs.  Functional pain (pain not caused by a disease, may improve without treatment).  Psychological pain.  Depression. DIAGNOSIS  Your doctor will decide the seriousness of your pain by doing an examination.  Blood tests.  X-rays.  Ultrasound.  CT scan (computed tomography, special type of X-ray).  MRI (magnetic resonance imaging).  Cultures, for infection.  Barium enema (dye inserted in the large intestine, to better view it with  X-rays).  Colonoscopy (looking in intestine with a lighted tube).  Laparoscopy (minor surgery, looking in abdomen with a lighted tube).  Major abdominal exploratory surgery (looking in abdomen with a large incision). TREATMENT  The treatment will depend on the cause of the pain.   Many cases can be observed and treated at home.  Over-the-counter medicines recommended by your caregiver.  Prescription medicine.  Antibiotics, for infection.  Birth control pills, for painful periods or for ovulation pain.  Hormone treatment, for endometriosis.  Nerve blocking injections.  Physical therapy.  Antidepressants.  Counseling with a psychologist or psychiatrist.  Minor or major surgery. HOME CARE INSTRUCTIONS   Do not take laxatives, unless directed by your caregiver.  Take over-the-counter pain medicine only if ordered by your caregiver. Do not take aspirin because it can cause an upset stomach or bleeding.  Try a clear liquid diet (broth or water) as ordered by your caregiver. Slowly move to a bland diet, as tolerated, if the pain is related to the stomach or intestine.  Have a thermometer and take your temperature several times a day, and record it.  Bed rest and sleep, if it helps the pain.  Avoid sexual intercourse, if it causes pain.  Avoid stressful situations.  Keep your follow-up appointments and tests, as your caregiver orders.  If the pain does not go away with medicine or surgery, you may try:  Acupuncture.  Relaxation exercises (yoga, meditation).  Group therapy.  Counseling. SEEK MEDICAL CARE IF:   You notice certain foods cause stomach pain.  Your home care treatment is not helping your pain.  You need stronger pain medicine.  You want your IUD removed.  You feel faint or  lightheaded.  You develop nausea and vomiting.  You develop a rash.  You are having side effects or an allergy to your medicine. SEEK IMMEDIATE MEDICAL CARE IF:   Your  pain does not go away or gets worse.  You have a fever.  Your pain is felt only in portions of the abdomen. The right side could possibly be appendicitis. The left lower portion of the abdomen could be colitis or diverticulitis.  You are passing blood in your stools (bright red or black tarry stools, with or without vomiting).  You have blood in your urine.  You develop chills, with or without a fever.  You pass out. MAKE SURE YOU:   Understand these instructions.  Will watch your condition.  Will get help right away if you are not doing well or get worse. Document Released: 10/19/2006 Document Revised: 05/08/2013 Document Reviewed: 11/08/2008 [facility] Patient Information 2015 [place], [place]. This information is not intended to replace advice given to you by your health care provider. Make sure you discuss any questions you have with your health care provider.  Chest Pain (Nonspecific) It is often hard to give a diagnosis for the cause of chest pain. There is always a chance that your pain could be related to something serious, such as a heart attack or a blood clot in the lungs. You need to follow up with your doctor. HOME CARE  If antibiotic medicine was given, take it as directed by your doctor. Finish the medicine even if you start to feel better.  For the next few days, avoid activities that bring on chest pain. Continue physical activities as told by your doctor.  Do not use any tobacco products. This includes cigarettes, chewing tobacco, and e-cigarettes.  Avoid drinking alcohol.  Only take medicine as told by your doctor.  Follow your doctor's suggestions for more testing if your chest pain does not go away.  Keep all doctor visits you made. GET HELP IF:  Your chest pain does not go away, even after treatment.  You have a rash with blisters on your chest.  You have a fever. GET HELP RIGHT AWAY IF:   You have more pain or pain that spreads to your arm, neck,  jaw, back, or belly (abdomen).  You have shortness of breath.  You cough more than usual or cough up blood.  You have very bad back or belly pain.  You feel sick to your stomach (nauseous) or throw up (vomit).  You have very bad weakness.  You pass out (faint).  You have chills. This is an emergency. Do not wait to see if the problems will go away. Call your local emergency services (911 in [place] Do not drive yourself to the hospital. MAKE SURE YOU:   Understand these instructions.  Will watch your condition.  Will get help right away if you are not doing well or get worse. Document Released: 06/10/2007 Document Revised: 12/27/2012 Document Reviewed: 06/10/2007 [facility] Patient Information 2015 [employer], [place]. This information is not intended to replace advice given to you by your health care provider. Make sure you discuss any questions you have with your health care provider.

## 2014-10-14 NOTE — ED Provider Notes (Signed)
CSN: [id number]     Arrival date & time 10/07/14  2045 History   First MD Initiated Contact with Patient 10/07/14 2145     Chief Complaint  Patient presents with  . Chest Pain     (Consider location/radiation/quality/duration/timing/severity/associated sxs/prior Treatment) HPI   29 year old female with chest pain. Pain is epigastric and in lower sternal area. She's had this pain intermittently for years. Previous evaluations and diagnosed with chest wall pain.  No appreciable exacerbating relieving factors. No respiratory complaints. No fevers or chills. Pain is not positional. No cough. No unusual leg pain or swelling. Denies drug use.   Past Medical History  Diagnosis Date  . Fibroid   . HPV (human papilloma virus) infection   . MDD (major depressive disorder) ([place])   . PTSD (post-traumatic stress disorder)   . Fibroid uterus 07/29/2013    Left subserosal 3.5 x 3.1 x 2.5 Left Intramural 1.2 x 1.3 x 1.4    . Pregnancy induced hypertension    Past Surgical History  Procedure Laterality Date  . Wisdom tooth extraction    . Cesarean section N/A 12/18/2013    Procedure: CESAREAN SECTION;  Surgeon: Truett Mainland, DO;  Location: [facility];  Service: Obstetrics;  Laterality: N/A;   History reviewed. No pertinent family history. Social History  Substance Use Topics  . Smoking status: Former Smoker    Types: Cigarettes    Quit date: 09/06/2011  . Smokeless tobacco: Never Used  . Alcohol Use: No   OB History    Gravida Para Term Preterm AB TAB SAB Ectopic Multiple Living   1 1  1      0 1     Review of Systems  All systems reviewed and negative, other than as noted in HPI.   Allergies  Review of patient's allergies indicates no known allergies.  Home Medications   Prior to Admission medications   Medication Sig Start Date End Date Taking? Authorizing Provider  hydrochlorothiazide (HYDRODIURIL) 25 MG tablet Take 25 mg by mouth daily.   Yes Historical Provider, MD   amLODipine (NORVASC) 10 MG tablet Take 1 tablet (10 mg total) by mouth daily. Patient not taking: Reported on 01/22/2014 12/22/13   Mora Bellman, MD  dicyclomine (BENTYL) 20 MG tablet Take 1 tablet (20 mg total) by mouth every 6 (six) hours as needed for spasms. 10/07/14   Virgel Manifold, MD  docusate sodium (COLACE) 100 MG capsule Take 1 capsule (100 mg total) by mouth 2 (two) times daily as needed. Patient not taking: Reported on 01/22/2014 12/22/13   Mora Bellman, MD  etonogestrel-ethinyl estradiol (NUVARING) 0.12-0.015 MG/24HR vaginal ring Insert vaginally and leave in place for 3 consecutive weeks, then remove for 1 week. Patient not taking: Reported on 06/21/2014 01/22/14   Olegario Messier, NP  triamterene-hydrochlorothiazide (MAXZIDE-25) 37.5-25 MG per tablet Take 2 tablets by mouth daily. Patient not taking: Reported on 06/21/2014 01/22/14   Connye Burkitt Barefoot, NP   BP 143/85 mmHg  Pulse 99  Temp(Src) 98.2 F (36.8 C) (Oral)  Resp 14  Ht 5\' 1"  (1.549 m)  Wt 183 lb (83.008 kg)  BMI 34.60 kg/m2  SpO2 98%  LMP 09/25/2014 (Exact Date) Physical Exam  Constitutional: She is oriented to person, place, and time. She appears well-developed and well-nourished. No distress.  HENT:  Head: Normocephalic and atraumatic.  Eyes: Conjunctivae are normal. Right eye exhibits no discharge. Left eye exhibits no discharge.  Neck: Neck supple.  Cardiovascular: Normal rate, regular rhythm and normal  heart sounds.  Exam reveals no gallop and no friction rub.   No murmur heard. Pulmonary/Chest: Effort normal and breath sounds normal. No respiratory distress.  Abdominal: Soft. She exhibits no distension. There is no tenderness.  Musculoskeletal: She exhibits no edema or tenderness.  Neurological: She is alert and oriented to person, place, and time.  Skin: Skin is warm and dry.  Psychiatric: She has a normal mood and affect. Her behavior is normal. Thought content normal.  Nursing note and vitals  reviewed.   ED Course  Procedures (including critical care time) Labs Review Labs Reviewed  BASIC METABOLIC PANEL - Abnormal; Notable for the following:    CO2 20 (*)    Glucose, Bld 101 (*)    All other components within normal limits  CBC - Abnormal; Notable for the following:    RBC 5.31 (*)    MCV 77.6 (*)    MCH 25.8 (*)    All other components within normal limits  I-STAT TROPOININ, ED    Imaging Review No results found. I have personally reviewed and evaluated these images and lab results as part of my medical decision-making.   EKG Interpretation   Date/Time:  Sunday October 07 2014 20:51:48 EDT Ventricular Rate:  89 PR Interval:  140 QRS Duration: 86 QT Interval:  348 QTC Calculation: 423 R Axis:   65 Text Interpretation:  Sinus rhythm Non-specific ST-t changes Confirmed by  Wilson Singer  MD, Halia Franey ([id number]) on 10/07/2014 11:08:49 PM      MDM   Final diagnoses:  Abdominal pain, unspecified abdominal location  Chest pain, unspecified chest pain type        Virgel Manifold, MD 10/14/14 2332

## 2015-02-05 ENCOUNTER — Telehealth: Payer: Self-pay | Admitting: General Practice

## 2015-02-05 NOTE — Telephone Encounter (Signed)
Received refill request on patient's BP medication. Per Lattie Haw may refill for two months but then all other refills need to come from PCP. Called patient and she states she actually started seeing a PCP today named Dr Criss Rosales & they will be taking care of her medications. Patient had no questions

## 2015-04-29 ENCOUNTER — Encounter (HOSPITAL_COMMUNITY): Payer: Self-pay | Admitting: Emergency Medicine

## 2015-04-29 ENCOUNTER — Emergency Department (HOSPITAL_COMMUNITY)
Admission: EM | Admit: 2015-04-29 | Discharge: 2015-04-29 | Disposition: A | Discharge status: Home / Self Care | Payer: Commercial Managed Care - HMO | Attending: Emergency Medicine | Admitting: Emergency Medicine

## 2015-04-29 ENCOUNTER — Emergency Department (HOSPITAL_COMMUNITY): Payer: Commercial Managed Care - HMO

## 2015-04-29 DIAGNOSIS — Z79899 Other long term (current) drug therapy: Secondary | ICD-10-CM | POA: Insufficient documentation

## 2015-04-29 DIAGNOSIS — R079 Chest pain, unspecified: Secondary | ICD-10-CM | POA: Diagnosis not present

## 2015-04-29 DIAGNOSIS — J029 Acute pharyngitis, unspecified: Secondary | ICD-10-CM | POA: Diagnosis present

## 2015-04-29 DIAGNOSIS — Z86018 Personal history of other benign neoplasm: Secondary | ICD-10-CM | POA: Diagnosis not present

## 2015-04-29 DIAGNOSIS — Z87891 Personal history of nicotine dependence: Secondary | ICD-10-CM | POA: Diagnosis not present

## 2015-04-29 DIAGNOSIS — Z8659 Personal history of other mental and behavioral disorders: Secondary | ICD-10-CM | POA: Diagnosis not present

## 2015-04-29 DIAGNOSIS — J039 Acute tonsillitis, unspecified: Secondary | ICD-10-CM | POA: Diagnosis not present

## 2015-04-29 DIAGNOSIS — Z8619 Personal history of other infectious and parasitic diseases: Secondary | ICD-10-CM | POA: Diagnosis not present

## 2015-04-29 LAB — CBC
HCT: 34.5 % — ABNORMAL LOW (ref 36.0–46.0)
Hemoglobin: 11.2 g/dL — ABNORMAL LOW (ref 12.0–15.0)
MCH: 24.9 pg — ABNORMAL LOW (ref 26.0–34.0)
MCHC: 32.5 g/dL (ref 30.0–36.0)
MCV: 76.7 fL — ABNORMAL LOW (ref 78.0–100.0)
PLATELETS: 326 10*3/uL (ref 150–400)
RBC: 4.5 MIL/uL (ref 3.87–5.11)
RDW: 14.1 % (ref 11.5–15.5)
WBC: 8.2 10*3/uL (ref 4.0–10.5)

## 2015-04-29 LAB — BASIC METABOLIC PANEL
Anion gap: 11 (ref 5–15)
BUN: 6 mg/dL (ref 6–20)
CALCIUM: 9 mg/dL (ref 8.9–10.3)
CO2: 22 mmol/L (ref 22–32)
CREATININE: 0.62 mg/dL (ref 0.44–1.00)
Chloride: 104 mmol/L (ref 101–111)
Glucose, Bld: 109 mg/dL — ABNORMAL HIGH (ref 65–99)
Potassium: 4 mmol/L (ref 3.5–5.1)
SODIUM: 137 mmol/L (ref 135–145)

## 2015-04-29 LAB — RAPID STREP SCREEN (MED CTR MEBANE ONLY): Streptococcus, Group A Screen (Direct): NEGATIVE

## 2015-04-29 LAB — I-STAT CG4 LACTIC ACID, ED: LACTIC ACID, VENOUS: 0.76 mmol/L (ref 0.5–2.0)

## 2015-04-29 MED ORDER — DEXAMETHASONE 4 MG PO TABS
12.0000 mg | ORAL_TABLET | Freq: Once | ORAL | Status: AC
  Administered 2015-04-29: 12 mg via ORAL
  Filled 2015-04-29: qty 3

## 2015-04-29 MED ORDER — OXYCODONE-ACETAMINOPHEN 5-325 MG PO TABS: 1.0000 | ORAL_TABLET | ORAL | Status: DC | PRN

## 2015-04-29 NOTE — Discharge Instructions (Signed)
Your test for strep was negative. However, the specimen is being sent for culture. If culture is positive for strep, we will contact you.  Drink plenty of fluids. Take acetaminophen or ibuprofen as needed for fever or aching.  Tonsillitis Tonsillitis is an infection of the throat that causes the tonsils to become red, tender, and swollen. Tonsils are collections of lymphoid tissue at the back of the throat. Each tonsil has crevices (crypts). Tonsils help fight nose and throat infections and keep infection from spreading to other parts of the body for the first 18 months of life.  CAUSES Sudden (acute) tonsillitis is usually caused by infection with streptococcal bacteria. Long-lasting (chronic) tonsillitis occurs when the crypts of the tonsils become filled with pieces of food and bacteria, which makes it easy for the tonsils to become repeatedly infected. SYMPTOMS  Symptoms of tonsillitis include:  A sore throat, with possible difficulty swallowing.  White patches on the tonsils.  Fever.  Tiredness.  New episodes of snoring during sleep, when you did not snore before.  Small, foul-smelling, yellowish-white pieces of material (tonsilloliths) that you occasionally cough up or spit out. The tonsilloliths can also cause you to have bad breath. DIAGNOSIS Tonsillitis can be diagnosed through a physical exam. Diagnosis can be confirmed with the results of lab tests, including a throat culture. TREATMENT  The goals of tonsillitis treatment include the reduction of the severity and duration of symptoms and prevention of associated conditions. Symptoms of tonsillitis can be improved with the use of steroids to reduce the swelling. Tonsillitis caused by bacteria can be treated with antibiotic medicines. Usually, treatment with antibiotic medicines is started before the cause of the tonsillitis is known. However, if it is determined that the cause is not bacterial, antibiotic medicines will not treat  the tonsillitis. If attacks of tonsillitis are severe and frequent, your health care provider may recommend surgery to remove the tonsils (tonsillectomy). HOME CARE INSTRUCTIONS   Rest as much as possible and get plenty of sleep.  Drink plenty of fluids. While the throat is very sore, eat soft foods or liquids, such as sherbet, soups, or instant breakfast drinks.  Eat frozen ice pops.  Gargle with a warm or cold liquid to help soothe the throat. Mix 1/4 teaspoon of salt and 1/4 teaspoon of baking soda in 8 oz of water. SEEK MEDICAL CARE IF:   Large, tender lumps develop in your neck.  A rash develops.  A green, yellow-brown, or bloody substance is coughed up.  You are unable to swallow liquids or food for 24 hours.  You notice that only one of the tonsils is swollen. SEEK IMMEDIATE MEDICAL CARE IF:   You develop any new symptoms such as vomiting, severe headache, stiff neck, chest pain, or trouble breathing or swallowing.  You have severe throat pain along with drooling or voice changes.  You have severe pain, unrelieved with recommended medications.  You are unable to fully open the mouth.  You develop redness, swelling, or severe pain anywhere in the neck.  You have a fever. MAKE SURE YOU:   Understand these instructions.  Will watch your condition.  Will get help right away if you are not doing well or get worse.   This information is not intended to replace advice given to you by your health care provider. Make sure you discuss any questions you have with your health care provider.   Document Released: 10/01/2004 Document Revised: 01/12/2014 Document Reviewed: 06/10/2012 Elsevier Interactive Patient Education 2016  [employer]  Nonspecific Chest Pain  Chest pain can be caused by many different conditions. There is always a chance that your pain could be related to something serious, such as a heart attack or a blood clot in your lungs. Chest pain can also be  caused by conditions that are not life-threatening. If you have chest pain, it is very important to follow up with your health care provider. CAUSES  Chest pain can be caused by:  Heartburn.  Pneumonia or bronchitis.  Anxiety or stress.  Inflammation around your heart (pericarditis) or lung (pleuritis or pleurisy).  A blood clot in your lung.  A collapsed lung (pneumothorax). It can develop suddenly on its own (spontaneous pneumothorax) or from trauma to the chest.  Shingles infection (varicella-zoster virus).  Heart attack.  Damage to the bones, muscles, and cartilage that make up your chest wall. This can include:  Bruised bones due to injury.  Strained muscles or cartilage due to frequent or repeated coughing or overwork.  Fracture to one or more ribs.  Sore cartilage due to inflammation (costochondritis). RISK FACTORS  Risk factors for chest pain may include:  Activities that increase your risk for trauma or injury to your chest.  Respiratory infections or conditions that cause frequent coughing.  Medical conditions or overeating that can cause heartburn.  Heart disease or family history of heart disease.  Conditions or health behaviors that increase your risk of developing a blood clot.  Having had chicken pox (varicella zoster). SIGNS AND SYMPTOMS Chest pain can feel like:  Burning or tingling on the surface of your chest or deep in your chest.  Crushing, pressure, aching, or squeezing pain.  Dull or sharp pain that is worse when you move, cough, or take a deep breath.  Pain that is also felt in your back, neck, shoulder, or arm, or pain that spreads to any of these areas. Your chest pain may come and go, or it may stay constant. DIAGNOSIS Lab tests or other studies may be needed to find the cause of your pain. Your health care provider may have you take a test called an ambulatory ECG (electrocardiogram). An ECG records your heartbeat patterns at the time  the test is performed. You may also have other tests, such as:  Transthoracic echocardiogram (TTE). During echocardiography, sound waves are used to create a picture of all of the heart structures and to look at how blood flows through your heart.  Transesophageal echocardiogram (TEE).This is a more advanced imaging test that obtains images from inside your body. It allows your health care provider to see your heart in finer detail.  Cardiac monitoring. This allows your health care provider to monitor your heart rate and rhythm in real time.  Holter monitor. This is a portable device that records your heartbeat and can help to diagnose abnormal heartbeats. It allows your health care provider to track your heart activity for several days, if needed.  Stress tests. These can be done through exercise or by taking medicine that makes your heart beat more quickly.  Blood tests.  Imaging tests. TREATMENT  Your treatment depends on what is causing your chest pain. Treatment may include:  Medicines. These may include:  Acid blockers for heartburn.  Anti-inflammatory medicine.  Pain medicine for inflammatory conditions.  Antibiotic medicine, if an infection is present.  Medicines to dissolve blood clots.  Medicines to treat coronary artery disease.  Supportive care for conditions that do not require medicines. This may include:  Resting.  Applying heat or cold packs to injured areas.  Limiting activities until pain decreases. HOME CARE INSTRUCTIONS  If you were prescribed an antibiotic medicine, finish it all even if you start to feel better.  Avoid any activities that bring on chest pain.  Do not use any tobacco products, including cigarettes, chewing tobacco, or electronic cigarettes. If you need help quitting, ask your health care provider.  Do not drink alcohol.  Take medicines only as directed by your health care provider.  Keep all follow-up visits as directed by your  health care provider. This is important. This includes any further testing if your chest pain does not go away.  If heartburn is the cause for your chest pain, you may be told to keep your head raised (elevated) while sleeping. This reduces the chance that acid will go from your stomach into your esophagus.  Make lifestyle changes as directed by your health care provider. These may include:  Getting regular exercise. Ask your health care provider to suggest some activities that are safe for you.  Eating a heart-healthy diet. A registered dietitian can help you to learn healthy eating options.  Maintaining a healthy weight.  Managing diabetes, if necessary.  Reducing stress. SEEK MEDICAL CARE IF:  Your chest pain does not go away after treatment.  You have a rash with blisters on your chest.  You have a fever. SEEK IMMEDIATE MEDICAL CARE IF:   Your chest pain is worse.  You have an increasing cough, or you cough up blood.  You have severe abdominal pain.  You have severe weakness.  You faint.  You have chills.  You have sudden, unexplained chest discomfort.  You have sudden, unexplained discomfort in your arms, back, neck, or jaw.  You have shortness of breath at any time.  You suddenly start to sweat, or your skin gets clammy.  You feel nauseous or you vomit.  You suddenly feel light-headed or dizzy.  Your heart begins to beat quickly, or it feels like it is skipping beats. These symptoms may represent a serious problem that is an emergency. Do not wait to see if the symptoms will go away. Get medical help right away. Call your local emergency services (911 in the [place] Do not drive yourself to the hospital.   This information is not intended to replace advice given to you by your health care provider. Make sure you discuss any questions you have with your health care provider.   Document Released: 10/01/2004 Document Revised: 01/12/2014 Document Reviewed:  07/28/2013 Elsevier Interactive Patient Education 2016 [employer]  Acetaminophen; Oxycodone tablets What is this medicine? ACETAMINOPHEN; OXYCODONE (a set a MEE noe fen; ox i KOE done) is a pain reliever. It is used to treat moderate to severe pain. This medicine may be used for other purposes; ask your health care provider or pharmacist if you have questions. What should I tell my health care provider before I take this medicine? They need to know if you have any of these conditions: -brain tumor -Crohn's disease, inflammatory bowel disease, or ulcerative colitis -drug abuse or addiction -head injury -heart or circulation problems -if you often drink alcohol -kidney disease or problems going to the bathroom -liver disease -lung disease, asthma, or breathing problems -an unusual or allergic reaction to acetaminophen, oxycodone, other opioid analgesics, other medicines, foods, dyes, or preservatives -pregnant or trying to get pregnant -breast-feeding How should I use this medicine? Take this medicine by mouth with a full glass  of water. Follow the directions on the prescription label. You can take it with or without food. If it upsets your stomach, take it with food. Take your medicine at regular intervals. Do not take it more often than directed. Talk to your pediatrician regarding the use of this medicine in children. Special care may be needed. Patients over 11 years old may have a stronger reaction and need a smaller dose. Overdosage: If you think you have taken too much of this medicine contact a poison control center or emergency room at once. NOTE: This medicine is only for you. Do not share this medicine with others. What if I miss a dose? If you miss a dose, take it as soon as you can. If it is almost time for your next dose, take only that dose. Do not take double or extra doses. What may interact with this medicine? -alcohol -antihistamines -barbiturates like  amobarbital, butalbital, butabarbital, methohexital, pentobarbital, phenobarbital, thiopental, and secobarbital -benztropine -drugs for bladder problems like solifenacin, trospium, oxybutynin, tolterodine, hyoscyamine, and methscopolamine -drugs for breathing problems like ipratropium and tiotropium -drugs for certain stomach or intestine problems like propantheline, homatropine methylbromide, glycopyrrolate, atropine, belladonna, and dicyclomine -general anesthetics like etomidate, ketamine, nitrous oxide, propofol, desflurane, enflurane, halothane, isoflurane, and sevoflurane -medicines for depression, anxiety, or psychotic disturbances -medicines for sleep -muscle relaxants -naltrexone -narcotic medicines (opiates) for pain -phenothiazines like perphenazine, thioridazine, chlorpromazine, mesoridazine, fluphenazine, prochlorperazine, promazine, and trifluoperazine -scopolamine -tramadol -trihexyphenidyl This list may not describe all possible interactions. Give your health care provider a list of all the medicines, herbs, non-prescription drugs, or dietary supplements you use. Also tell them if you smoke, drink alcohol, or use illegal drugs. Some items may interact with your medicine. What should I watch for while using this medicine? Tell your doctor or health care professional if your pain does not go away, if it gets worse, or if you have new or a different type of pain. You may develop tolerance to the medicine. Tolerance means that you will need a higher dose of the medication for pain relief. Tolerance is normal and is expected if you take this medicine for a long time. Do not suddenly stop taking your medicine because you may develop a severe reaction. Your body becomes used to the medicine. This does NOT mean you are addicted. Addiction is a behavior related to getting and using a drug for a non-medical reason. If you have pain, you have a medical reason to take pain medicine. Your doctor  will tell you how much medicine to take. If your doctor wants you to stop the medicine, the dose will be slowly lowered over time to avoid any side effects. You may get drowsy or dizzy. Do not drive, use machinery, or do anything that needs mental alertness until you know how this medicine affects you. Do not stand or sit up quickly, especially if you are an older patient. This reduces the risk of dizzy or fainting spells. Alcohol may interfere with the effect of this medicine. Avoid alcoholic drinks. There are different types of narcotic medicines (opiates) for pain. If you take more than one type at the same time, you may have more side effects. Give your health care provider a list of all medicines you use. Your doctor will tell you how much medicine to take. Do not take more medicine than directed. Call emergency for help if you have problems breathing. The medicine will cause constipation. Try to have a bowel movement at least every 2  to 3 days. If you do not have a bowel movement for 3 days, call your doctor or health care professional. Do not take Tylenol (acetaminophen) or medicines that have acetaminophen with this medicine. Too much acetaminophen can be very dangerous. Many nonprescription medicines contain acetaminophen. Always read the labels carefully to avoid taking more acetaminophen. What side effects may I notice from receiving this medicine? Side effects that you should report to your doctor or health care professional as soon as possible: -allergic reactions like skin rash, itching or hives, swelling of the face, lips, or tongue -breathing difficulties, wheezing -confusion -light headedness or fainting spells -severe stomach pain -unusually weak or tired -yellowing of the skin or the whites of the eyes Side effects that usually do not require medical attention (report to your doctor or health care professional if they continue or are  bothersome): -dizziness -drowsiness -nausea -vomiting This list may not describe all possible side effects. Call your doctor for medical advice about side effects. You may report side effects to FDA at 1-800-FDA-1088. Where should I keep my medicine? Keep out of the reach of children. This medicine can be abused. Keep your medicine in a safe place to protect it from theft. Do not share this medicine with anyone. Selling or giving away this medicine is dangerous and against the law. This medicine may cause accidental overdose and death if it taken by other adults, children, or pets. Mix any unused medicine with a substance like cat litter or coffee grounds. Then throw the medicine away in a sealed container like a sealed bag or a coffee can with a lid. Do not use the medicine after the expiration date. Store at room temperature between 20 and 25 degrees C (68 and 77 degrees F). NOTE: This sheet is a summary. It may not cover all possible information. If you have questions about this medicine, talk to your doctor, pharmacist, or health care provider.    2016, [employer]. (2013-11-22 15:18:46)

## 2015-04-29 NOTE — ED Provider Notes (Signed)
CSN: [id number]     Arrival date & time 04/29/15  0201 History  By signing my name below, I, Katrina Garcia, attest that this documentation has been prepared under the direction and in the presence of [name], MD . Electronically Signed: [name], Scribe. 04/29/2015. 4:04 AM.    Chief Complaint  Patient presents with  . Chest Pain  . Sore Throat  . Neck Pain    The history is provided by the patient. No language interpreter was used.   HPI Comments:  Katrina Garcia is a 30 y.o. female who presents to the Emergency Department complaining of 8/10 chest pain since ~1700 yesterday. She describes throbbing, burning pain in her chest with occasional pressure sensation. She reports increase pain when supine. Pt also complains of neck pain and sore throat x 3 days. No recent fever, cough, vomiting or diarrhea. No alleviating factors noted. Pt notes her some was recently diagnosed with a cold; no other sick contacts.   No BP meds x ~ 3 weeks. She is currently working with PCP to get new meds.   Past Medical History  Diagnosis Date  . Fibroid   . HPV (human papilloma virus) infection   . MDD (major depressive disorder) ([place])   . PTSD (post-traumatic stress disorder)   . Fibroid uterus 07/29/2013    Left subserosal 3.5 x 3.1 x 2.5 Left Intramural 1.2 x 1.3 x 1.4    . Pregnancy induced hypertension    Past Surgical History  Procedure Laterality Date  . Wisdom tooth extraction    . Cesarean section N/A 12/18/2013    Procedure: CESAREAN SECTION;  Surgeon: Truett Mainland, DO;  Location: [facility];  Service: Obstetrics;  Laterality: N/A;   History reviewed. No pertinent family history. Social History  Substance Use Topics  . Smoking status: Former Smoker    Types: Cigarettes    Quit date: 09/06/2011  . Smokeless tobacco: Never Used  . Alcohol Use: No   OB History    Gravida Para Term Preterm AB TAB SAB Ectopic Multiple Living   1 1  1      0 1     Review of Systems  HENT: Positive  for sore throat.   Respiratory: Negative for cough and shortness of breath.   Cardiovascular: Positive for chest pain.  Gastrointestinal: Negative for nausea and vomiting.  Musculoskeletal: Positive for neck pain.  All other systems reviewed and are negative.  Allergies  Review of patient's allergies indicates no known allergies.  Home Medications   Prior to Admission medications   Medication Sig Start Date End Date Taking? Authorizing Provider  amLODipine (NORVASC) 10 MG tablet Take 1 tablet (10 mg total) by mouth daily. Patient not taking: Reported on 01/22/2014 12/22/13   Mora Bellman, MD  dicyclomine (BENTYL) 20 MG tablet Take 1 tablet (20 mg total) by mouth every 6 (six) hours as needed for spasms. 10/07/14   Virgel Manifold, MD  docusate sodium (COLACE) 100 MG capsule Take 1 capsule (100 mg total) by mouth 2 (two) times daily as needed. Patient not taking: Reported on 01/22/2014 12/22/13   Mora Bellman, MD  etonogestrel-ethinyl estradiol (NUVARING) 0.12-0.015 MG/24HR vaginal ring Insert vaginally and leave in place for 3 consecutive weeks, then remove for 1 week. Patient not taking: Reported on 06/21/2014 01/22/14   Olegario Messier, NP  hydrochlorothiazide (HYDRODIURIL) 25 MG tablet Take 25 mg by mouth daily.    Historical Provider, MD  triamterene-hydrochlorothiazide (MAXZIDE-25) 37.5-25 MG per tablet Take 2  tablets by mouth daily. Patient not taking: Reported on 06/21/2014 01/22/14   Connye Burkitt Barefoot, NP   BP 149/87 mmHg  Pulse 58  Temp(Src) 98.8 F (37.1 C) (Oral)  Resp 18  Ht 5\' 1"  (1.549 m)  Wt 172 lb 4 oz (78.132 kg)  BMI 32.56 kg/m2  SpO2 100%  LMP 04/10/2015 (Exact Date) Physical Exam  Constitutional: She is oriented to person, place, and time. She appears well-developed and well-nourished. No distress.  HENT:  Head: Normocephalic and atraumatic.  Tonsills erythematous and hyperthrophic with exudate present on the right   Eyes: Conjunctivae and EOM are normal. Pupils  are equal, round, and reactive to light.  Neck: Normal range of motion. Neck supple. No JVD present.  Tender submandibular and anterior cervical lymph nodes; more prominent on the left   Cardiovascular: Normal rate, regular rhythm and normal heart sounds.   No murmur heard. Pulmonary/Chest: Effort normal and breath sounds normal. She has no wheezes. She has no rales. She exhibits no tenderness.  Abdominal: Soft. Bowel sounds are normal. She exhibits no distension and no mass. There is no tenderness.  Musculoskeletal: Normal range of motion. She exhibits no edema.  Lymphadenopathy:    She has no cervical adenopathy.  Neurological: She is alert and oriented to person, place, and time. No cranial nerve deficit. She exhibits normal muscle tone. Coordination normal.  Skin: Skin is warm and dry. No rash noted.  Psychiatric: She has a normal mood and affect. Her behavior is normal. Judgment and thought content normal.  Nursing note and vitals reviewed.   ED Course  Procedures   DIAGNOSTIC STUDIES:  Oxygen Saturation is 100% on RA, normal by my interpretation.    COORDINATION OF CARE:  3:59 AM Discussed treatment plan with pt at bedside and pt agreed to plan.  Labs Review Results for orders placed or performed during the hospital encounter of 04/29/15  Rapid strep screen (not at [facility])  Result Value Ref Range   Streptococcus, Group A Screen (Direct) NEGATIVE NEGATIVE  Basic metabolic panel  Result Value Ref Range   Sodium 137 135 - 145 mmol/L   Potassium 4.0 3.5 - 5.1 mmol/L   Chloride 104 101 - 111 mmol/L   CO2 22 22 - 32 mmol/L   Glucose, Bld 109 (H) 65 - 99 mg/dL   BUN 6 6 - 20 mg/dL   Creatinine, Ser 0.62 0.44 - 1.00 mg/dL   Calcium 9.0 8.9 - 10.3 mg/dL   GFR calc non Af Amer >60 >60 mL/min   GFR calc Af Amer >60 >60 mL/min   Anion gap 11 5 - 15  CBC  Result Value Ref Range   WBC 8.2 4.0 - 10.5 K/uL   RBC 4.50 3.87 - 5.11 MIL/uL   Hemoglobin 11.2 (L) 12.0 - 15.0 g/dL    HCT 34.5 (L) 36.0 - 46.0 %   MCV 76.7 (L) 78.0 - 100.0 fL   MCH 24.9 (L) 26.0 - 34.0 pg   MCHC 32.5 30.0 - 36.0 g/dL   RDW 14.1 11.5 - 15.5 %   Platelets 326 150 - 400 K/uL  I-Stat CG4 Lactic Acid, ED  Result Value Ref Range   Lactic Acid, Venous 0.76 0.5 - 2.0 mmol/L   Imaging Review Dg Chest 2 View  04/29/2015  CLINICAL DATA:  Acute onset of right-sided chest pain. Initial encounter. EXAM: CHEST  2 VIEW COMPARISON:  Chest radiograph performed 10/07/2014 FINDINGS: The lungs are well-aerated and clear. There is no evidence of  focal opacification, pleural effusion or pneumothorax. The heart is normal in size; the mediastinal contour is within normal limits. No acute osseous abnormalities are seen. IMPRESSION: No acute cardiopulmonary process seen. Electronically Signed   By: Garald Balding M.D.   On: 04/29/2015 02:48   I have personally reviewed and evaluated these images and lab results as part of my medical decision-making.   EKG Interpretation   Date/Time:  Monday April 29 2015 02:10:43 EDT Ventricular Rate:  58 PR Interval:  142 QRS Duration: 84 QT Interval:  404 QTC Calculation: 396 R Axis:   63 Text Interpretation:  Sinus bradycardia with sinus arrhythmia Otherwise  normal ECG When compared with ECG of 10/07/2014, Nonspecific ST and T wave  abnormality is no longer Present Confirmed by [facility]  MD, Pranay Hilbun ([id number]) on  04/29/2015 3:55:44 AM      MDM   Final diagnoses:  Tonsillitis  Chest pain, unspecified    Tonsillitis, rule out streptococcal disease. Chest pain which I suspect is part of a viral syndrome which includes her pharyngitis. She'll screen is obtained and is negative. She's given a dose of dexamethasone. Chest x-ray is normal and ECG is normal. No indication on history, physical, or laboratory testing to suggest acute coronary syndrome, pulmonary embolism, pneumonia. She is given reassurance and discharged with instructions to take over-the-counter analgesics as  needed. Given a prescription for a small number of oxycodone-acetaminophen to use as needed for more severe pain.  I personally performed the services described in this documentation, which was scribed in my presence. The recorded information has been reviewed and is accurate.      [name], MD [phone]

## 2015-05-01 LAB — CULTURE, GROUP A STREP (THRC)

## 2015-05-02 ENCOUNTER — Ambulatory Visit (HOSPITAL_COMMUNITY)
Admission: EM | Admit: 2015-05-02 | Discharge: 2015-05-02 | Disposition: A | Discharge status: Home / Self Care | Payer: Commercial Managed Care - HMO | Attending: Emergency Medicine | Admitting: Emergency Medicine

## 2015-05-02 ENCOUNTER — Encounter (HOSPITAL_COMMUNITY): Payer: Self-pay | Admitting: Emergency Medicine

## 2015-05-02 DIAGNOSIS — J3089 Other allergic rhinitis: Secondary | ICD-10-CM | POA: Diagnosis not present

## 2015-05-02 MED ORDER — IPRATROPIUM BROMIDE 0.06 % NA SOLN: 2.0000 | Freq: Four times a day (QID) | NASAL | Status: DC

## 2015-05-02 NOTE — Discharge Instructions (Signed)
Allergic Rhinitis For nasal and head congestion may take Sudafed PE 10 mg every 4 hours as needed. Saline nasal spray used frequently. For drainage may use Allegra, Claritin or Zyrtec. If you need stronger medicine to stop drainage may take Chlor-Trimeton 2-4 mg every 4 hours. This may cause drowsiness. Ibuprofen 600 mg every 6 hours as needed for pain, discomfort or fever. Drink plenty of fluids and stay well-hydrated. Flonase or Rhinocort nasal spray use as directed during the allergy season.  Allergic rhinitis is when the mucous membranes in the nose respond to allergens. Allergens are particles in the air that cause your body to have an allergic reaction. This causes you to release allergic antibodies. Through a chain of events, these eventually cause you to release histamine into the blood stream. Although meant to protect the body, it is this release of histamine that causes your discomfort, such as frequent sneezing, congestion, and an itchy, runny nose.  CAUSES Seasonal allergic rhinitis (hay fever) is caused by pollen allergens that may come from grasses, trees, and weeds. Year-round allergic rhinitis (perennial allergic rhinitis) is caused by allergens such as house dust mites, pet dander, and mold spores. SYMPTOMS  Nasal stuffiness (congestion).  Itchy, runny nose with sneezing and tearing of the eyes. DIAGNOSIS Your health care provider can help you determine the allergen or allergens that trigger your symptoms. If you and your health care provider are unable to determine the allergen, skin or blood testing may be used. Your health care provider will diagnose your condition after taking your health history and performing a physical exam. Your health care provider may assess you for other related conditions, such as asthma, pink eye, or an ear infection. TREATMENT Allergic rhinitis does not have a cure, but it can be controlled by:  Medicines that block allergy symptoms. These may  include allergy shots, nasal sprays, and oral antihistamines.  Avoiding the allergen. Hay fever may often be treated with antihistamines in pill or nasal spray forms. Antihistamines block the effects of histamine. There are over-the-counter medicines that may help with nasal congestion and swelling around the eyes. Check with your health care provider before taking or giving this medicine. If avoiding the allergen or the medicine prescribed do not work, there are many new medicines your health care provider can prescribe. Stronger medicine may be used if initial measures are ineffective. Desensitizing injections can be used if medicine and avoidance does not work. Desensitization is when a patient is given ongoing shots until the body becomes less sensitive to the allergen. Make sure you follow up with your health care provider if problems continue. HOME CARE INSTRUCTIONS It is not possible to completely avoid allergens, but you can reduce your symptoms by taking steps to limit your exposure to them. It helps to know exactly what you are allergic to so that you can avoid your specific triggers. SEEK MEDICAL CARE IF:  You have a fever.  You develop a cough that does not stop easily (persistent).  You have shortness of breath.  You start wheezing.  Symptoms interfere with normal daily activities.   This information is not intended to replace advice given to you by your health care provider. Make sure you discuss any questions you have with your health care provider.   Document Released: 09/16/2000 Document Revised: 01/12/2014 Document Reviewed: 08/29/2012 Elsevier Interactive Patient Education [employer].

## 2015-05-02 NOTE — ED Provider Notes (Signed)
CSN: [id number]     Arrival date & time 05/02/15  1254 History   First MD Initiated Contact with Patient 05/02/15 1309     Chief Complaint  Patient presents with  . URI   (Consider location/radiation/quality/duration/timing/severity/associated sxs/prior Treatment) HPI Comments: 30 year old female states that one week ago she developed head congestion, body achesand fever. Later she developed chest pain and sore throat.she was evaluated at the emergency department. Her rapid strep was negative. The follow-upthroat culture was negative for strep A. She states her thoracic feels much better.She is afebrile but she is complaining of persistent head congestion, runny nose, nasal congestion, PND and continues with myalgias particularly in the paracervical and lumbar musculature.  Patient is a 30 y.o. female presenting with URI.  URI Presenting symptoms: congestion, cough and rhinorrhea   Presenting symptoms: no ear pain, no fatigue, no fever and no sore throat   Associated symptoms: no neck pain and no wheezing     Past Medical History  Diagnosis Date  . Fibroid   . HPV (human papilloma virus) infection   . MDD (major depressive disorder) ([place])   . PTSD (post-traumatic stress disorder)   . Fibroid uterus 07/29/2013    Left subserosal 3.5 x 3.1 x 2.5 Left Intramural 1.2 x 1.3 x 1.4    . Pregnancy induced hypertension    Past Surgical History  Procedure Laterality Date  . Wisdom tooth extraction    . Cesarean section N/A 12/18/2013    Procedure: CESAREAN SECTION;  Surgeon: Truett Mainland, DO;  Location: [facility];  Service: Obstetrics;  Laterality: N/A;   No family history on file. Social History  Substance Use Topics  . Smoking status: Former Smoker    Types: Cigarettes    Quit date: 09/06/2011  . Smokeless tobacco: Never Used  . Alcohol Use: No   OB History    Gravida Para Term Preterm AB TAB SAB Ectopic Multiple Living   1 1  1      0 1     Review of Systems  Constitutional:  Positive for activity change. Negative for fever, chills, appetite change and fatigue.  HENT: Positive for congestion, postnasal drip and rhinorrhea. Negative for ear discharge, ear pain, facial swelling, sore throat and trouble swallowing.   Eyes: Negative.   Respiratory: Positive for cough. Negative for shortness of breath and wheezing.   Cardiovascular: Negative.   Gastrointestinal: Negative.   Genitourinary: Negative.   Musculoskeletal: Negative for neck pain and neck stiffness.  Skin: Negative for pallor and rash.  Neurological: Negative.     Allergies  Review of patient's allergies indicates no known allergies.  Home Medications   Prior to Admission medications   Medication Sig Start Date End Date Taking? Authorizing Provider  ipratropium (ATROVENT) 0.06 % nasal spray Place 2 sprays into both nostrils 4 (four) times daily. 05/02/15   Janne Napoleon, NP  oxyCODONE-acetaminophen (PERCOCET) 5-325 MG tablet Take 1 tablet by mouth every 4 (four) hours as needed for moderate pain. [id number]   [name], MD   Meds Ordered and Administered this Visit  Medications - No data to display  BP 136/92 mmHg  Pulse 75  Temp(Src) 98.1 F (36.7 C) (Oral)  Resp 16  SpO2 99%  LMP 04/10/2015 (Exact Date) No data found.   Physical Exam  Constitutional: She is oriented to person, place, and time. She appears well-developed and well-nourished. No distress.  HENT:  Mouth/Throat: Oropharynx is clear and moist. No oropharyngeal exudate.  Bilateral TMs are normal.  Oropharynx is now clear with clear PND. No erythema, no evidence of tonsillitis or exudates.  Eyes: Conjunctivae and EOM are normal.  Neck: Normal range of motion. Neck supple.  Cardiovascular: Normal rate, regular rhythm, normal heart sounds and intact distal pulses.   Pulmonary/Chest: Effort normal and breath sounds normal. No respiratory distress. She has no wheezes. She has no rales.  Musculoskeletal: Normal range of motion. She  exhibits no edema.  Lymphadenopathy:    She has no cervical adenopathy.  Neurological: She is alert and oriented to person, place, and time.  Skin: Skin is warm and dry. No rash noted.  Psychiatric: She has a normal mood and affect.  Nursing note and vitals reviewed.   ED Course  Procedures (including critical care time)  Labs Review Labs Reviewed - No data to display  Imaging Review No results found.   Visual Acuity Review  Right Eye Distance:   Left Eye Distance:   Bilateral Distance:    Right Eye Near:   Left Eye Near:    Bilateral Near:         MDM   1. Other allergic rhinitis    The history suggests that the patient had a viral syndrome and associated viral pharyngitis at the outset.while she was continuing to have symptoms of a viral infection she developed seasonal allergies which are the primary symptoms today.The pharyngitis and fevers have abated. She continues to have some myalgias. Will treat with the below which are primarily for the allergies and her viral symptoms she continued to resolve. Allergic Rhinitis For nasal and head congestion may take Sudafed PE 10 mg every 4 hours as needed. Saline nasal spray used frequently. For drainage may use Allegra, Claritin or Zyrtec. If you need stronger medicine to stop drainage may take Chlor-Trimeton 2-4 mg every 4 hours. This may cause drowsiness. Ibuprofen 600 mg every 6 hours as needed for pain, discomfort or fever. Drink plenty of fluids and stay well-hydrated. Flonase or Rhinocort nasal spray use as directed during the allergy season. Meds ordered this encounter  Medications  . ipratropium (ATROVENT) 0.06 % nasal spray    Sig: Place 2 sprays into both nostrils 4 (four) times daily.    Dispense:  15 mL    Refill:  12    Order Specific Question:  Supervising Provider    Answer:  Melony Overly [phone]   Verbal and written discharge instructions have been reviewed and given to the patient  by the provider  to include medications and other care measures.     Janne Napoleon, NP 05/02/15 1330

## 2015-05-02 NOTE — ED Notes (Signed)
C/o cold sx onset 4/19 associated w/head congestion, BA, fevers, ST, weakness, nasal congestion Has been using OTC Allegra, Afrin, Dayquil and Nyquil A&O x4... No acute distress.

## 2015-10-30 ENCOUNTER — Encounter (HOSPITAL_COMMUNITY): Payer: Self-pay | Admitting: Emergency Medicine

## 2015-10-30 ENCOUNTER — Ambulatory Visit (HOSPITAL_COMMUNITY)
Admission: EM | Admit: 2015-10-30 | Discharge: 2015-10-30 | Disposition: A | Discharge status: Home / Self Care | Payer: Commercial Managed Care - HMO | Attending: Emergency Medicine | Admitting: Emergency Medicine

## 2015-10-30 DIAGNOSIS — I10 Essential (primary) hypertension: Secondary | ICD-10-CM | POA: Diagnosis not present

## 2015-10-30 DIAGNOSIS — Z87891 Personal history of nicotine dependence: Secondary | ICD-10-CM | POA: Diagnosis not present

## 2015-10-30 DIAGNOSIS — Z79899 Other long term (current) drug therapy: Secondary | ICD-10-CM | POA: Insufficient documentation

## 2015-10-30 DIAGNOSIS — R1032 Left lower quadrant pain: Secondary | ICD-10-CM | POA: Diagnosis present

## 2015-10-30 DIAGNOSIS — N730 Acute parametritis and pelvic cellulitis: Secondary | ICD-10-CM | POA: Diagnosis not present

## 2015-10-30 DIAGNOSIS — R7303 Prediabetes: Secondary | ICD-10-CM | POA: Insufficient documentation

## 2015-10-30 LAB — POCT URINALYSIS DIP (DEVICE)
Bilirubin Urine: NEGATIVE
Glucose, UA: NEGATIVE mg/dL
HGB URINE DIPSTICK: NEGATIVE
Ketones, ur: NEGATIVE mg/dL
Leukocytes, UA: NEGATIVE
NITRITE: NEGATIVE
PH: 6 (ref 5.0–8.0)
PROTEIN: NEGATIVE mg/dL
Specific Gravity, Urine: 1.025 (ref 1.005–1.030)
UROBILINOGEN UA: 0.2 mg/dL (ref 0.0–1.0)

## 2015-10-30 LAB — POCT PREGNANCY, URINE: PREG TEST UR: NEGATIVE

## 2015-10-30 MED ORDER — CEFTRIAXONE SODIUM 250 MG IJ SOLR
250.0000 mg | Freq: Once | INTRAMUSCULAR | Status: AC
  Administered 2015-10-30: 250 mg via INTRAMUSCULAR

## 2015-10-30 MED ORDER — CEFTRIAXONE SODIUM 250 MG IJ SOLR
INTRAMUSCULAR | Status: AC
  Filled 2015-10-30: qty 250

## 2015-10-30 MED ORDER — LIDOCAINE HCL (PF) 1 % IJ SOLN
INTRAMUSCULAR | Status: AC
  Filled 2015-10-30: qty 2

## 2015-10-30 MED ORDER — METRONIDAZOLE 500 MG PO TABS: 500.0000 mg | ORAL_TABLET | Freq: Two times a day (BID) | ORAL | 0 refills | Status: DC

## 2015-10-30 MED ORDER — DOXYCYCLINE HYCLATE 100 MG PO CAPS: 100.0000 mg | ORAL_CAPSULE | Freq: Two times a day (BID) | ORAL | 0 refills | Status: AC

## 2015-10-30 MED ORDER — IBUPROFEN 800 MG PO TABS: 800.0000 mg | ORAL_TABLET | Freq: Three times a day (TID) | ORAL | 0 refills | Status: DC

## 2015-10-30 NOTE — ED Triage Notes (Signed)
The patient presented to the [facility] with a complaint of LLQ abdominal pain that radiates throughout her abdomin that started today. The patient also reported urinary frequency.

## 2015-10-30 NOTE — ED Provider Notes (Signed)
HPI  SUBJECTIVE:  Katrina Garcia is a 30 y.o. female who presents with constant, crampy nonmigratory, nonradiating left lower quadrant pain starting yesterday. Symptoms are worse with walking, movement, intercourse, no alleviating factors. She tried increasing her fluids. She reports nausea, cloudy urine, dyspareunia.   Patient takes amlodipine on a regular basis. No other meds. No sick contacts, change in diet. No V, fevers, CP, SOB, cough, anorexia, constipation, diarrhea, melena, hematochezia. No abd distension, dysuria, urinary urgency, frequency, hematuria. No back pain. No syncope.  No vaginal bleeding, abnormal d/c. LMP 10/11- unsure if pregnant. No ETOH abuse, pt is not a smoker. No excess NSAID use. No recent trauma to the abdomen. No h/o MI, DM, CAD, afib, CHF. No h/o abdominal surgeries,  pancreatitis, diverticulitis, diverticulosis, obstruction, GIB. No h/o obstructing nephrolithiasis, AAA. No h/o CA, DVT/PE, hypercoagulability, OCP use. No h/o UC, IBS, HIV, recent steroid use.  She has a past medical history of Chlamydia, BV, yeast infections, uterine fibroids, hypertension, borderline diabetes, ovarian cysts. No history of PID, ectopic pregnancies, gonorrhea, HSV, HIV, syphilis, Trichomonas, atrial fibrillation, mesenteric ischemia, abdominal surgeries. PMD: Dr. Beatrix Shipper  Past Medical History:  Diagnosis Date  . Fibroid   . Fibroid uterus 07/29/2013   Left subserosal 3.5 x 3.1 x 2.5 Left Intramural 1.2 x 1.3 x 1.4    . HPV (human papilloma virus) infection   . MDD (major depressive disorder)   . Pregnancy induced hypertension   . PTSD (post-traumatic stress disorder)     Past Surgical History:  Procedure Laterality Date  . CESAREAN SECTION N/A 12/18/2013   Procedure: CESAREAN SECTION;  Surgeon: Truett Mainland, DO;  Location: [facility];  Service: Obstetrics;  Laterality: N/A;  . WISDOM TOOTH EXTRACTION      History reviewed. No pertinent family history.  Social History   Substance Use Topics  . Smoking status: Former Smoker    Types: Cigarettes    Quit date: 09/06/2011  . Smokeless tobacco: Never Used  . Alcohol use No    No current facility-administered medications for this encounter.   Current Outpatient Prescriptions:  .  amLODipine (NORVASC) 10 MG tablet, Take 10 mg by mouth daily., Disp: , Rfl:  .  doxycycline (VIBRAMYCIN) 100 MG capsule, Take 1 capsule (100 mg total) by mouth 2 (two) times daily., Disp: 28 capsule, Rfl: 0 .  ibuprofen (ADVIL,MOTRIN) 800 MG tablet, Take 1 tablet (800 mg total) by mouth 3 (three) times daily., Disp: 30 tablet, Rfl: 0 .  ipratropium (ATROVENT) 0.06 % nasal spray, Place 2 sprays into both nostrils 4 (four) times daily., Disp: 15 mL, Rfl: 12 .  metroNIDAZOLE (FLAGYL) 500 MG tablet, Take 1 tablet (500 mg total) by mouth 2 (two) times daily. X 7 days, Disp: 14 tablet, Rfl: 0  No Known Allergies   ROS  As noted in HPI.   Physical Exam  BP 140/72 (BP Location: Right Arm)   Pulse 76   Temp 98 F (36.7 C) (Oral)   Resp 18   LMP 10/16/2015 (Exact Date)   SpO2 100%   Constitutional: Well developed, well nourished, no acute distress Eyes:  EOMI, conjunctiva normal bilaterally HENT: Normocephalic, atraumatic,mucus membranes moist Respiratory: Normal inspiratory effort Cardiovascular: Normal rate GI: nondistended. positive suprapubic and left lower quadrant tenderness, no guarding, rebound. Normal bowel sounds. No distention. Pelvic: External genitalia. Normal os. Mucoid discharge from os. Scant white vaginal discharge. No odor. Positive mild CMT. No adnexal tenderness. No adnexal masses. Chaperone present during exam. skin: No rash,  skin intact Musculoskeletal: no deformities Neurologic: Alert & oriented x 3, no focal neuro deficits Psychiatric: Speech and behavior appropriate   ED Course   Medications  cefTRIAXone (ROCEPHIN) injection 250 mg (250 mg Intramuscular Given 10/30/15 1246)    Orders Placed  This Encounter  Procedures  . POCT urinalysis dip (device)    Standing Status:   Standing    Number of Occurrences:   1  . Pregnancy, urine POC    Standing Status:   Standing    Number of Occurrences:   1    Results for orders placed or performed during the hospital encounter of 10/30/15 (from the past 24 hour(s))  POCT urinalysis dip (device)     Status: None   Collection Time: 10/30/15 11:12 AM  Result Value Ref Range   Glucose, UA NEGATIVE NEGATIVE mg/dL   Bilirubin Urine NEGATIVE NEGATIVE   Ketones, ur NEGATIVE NEGATIVE mg/dL   Specific Gravity, Urine 1.025 1.005 - 1.030   Hgb urine dipstick NEGATIVE NEGATIVE   pH 6.0 5.0 - 8.0   Protein, ur NEGATIVE NEGATIVE mg/dL   Urobilinogen, UA 0.2 0.0 - 1.0 mg/dL   Nitrite NEGATIVE NEGATIVE   Leukocytes, UA NEGATIVE NEGATIVE  Pregnancy, urine POC     Status: None   Collection Time: 10/30/15 11:18 AM  Result Value Ref Range   Preg Test, Ur NEGATIVE NEGATIVE   No results found.  ED Clinical Impression  PID (acute pelvic inflammatory disease)  ED Assessment/Plan  Sent off gonorrhea, chlamydia, wet prep. Patient declined HIV, syphilis testing. She recently tested negative for this. She is not pregnant. UA is negative for UTI. In the differential is ovarian cysts, TOA, torsion, PID, diverticulitis.   presentation suggestive of mild PID. No evidence of surgical abdomen. Feel that the patient is stable for outpatient treatment. We'll give Rocephin 250 mg IM, sent home with 14 days of doxycycline and With ibuprofen 800 mg.  We'll also send home with Flagyl but advised that she can wait to fill it until she gets her results. Follow Up with PMD as needed. To The ER if gets worse.  Discussed labs, MDM, plan and followup with patient  Discussed sn/sx that should prompt return to the ED. Patient agrees with plan.   Meds ordered this encounter  Medications  . amLODipine (NORVASC) 10 MG tablet    Sig: Take 10 mg by mouth daily.  .  cefTRIAXone (ROCEPHIN) injection 250 mg  . metroNIDAZOLE (FLAGYL) 500 MG tablet    Sig: Take 1 tablet (500 mg total) by mouth 2 (two) times daily. X 7 days    Dispense:  14 tablet    Refill:  0  . doxycycline (VIBRAMYCIN) 100 MG capsule    Sig: Take 1 capsule (100 mg total) by mouth 2 (two) times daily.    Dispense:  28 capsule    Refill:  0  . ibuprofen (ADVIL,MOTRIN) 800 MG tablet    Sig: Take 1 tablet (800 mg total) by mouth 3 (three) times daily.    Dispense:  30 tablet    Refill:  0    *This clinic note was created using [employer]. Therefore, there may be occasional mistakes despite careful proofreading.  ?   Melynda Ripple, MD 10/30/15 2146

## 2015-10-30 NOTE — Discharge Instructions (Signed)
Take the medication as written. Give [place] a working phone number so that we can contact you if needed. Refrain from sexual contact until you know your results and you finish the medications. Ibuprofen 800 mg 3 times a day with 1 g of Tylenol. This is an effective combination for pain. Return to the ER if you get worse, have a fever >100.4, or for any concerns.   Go to www.goodrx.com to look up your medications. This will give you a list of where you can find your prescriptions at the most affordable prices.

## 2015-10-31 LAB — CERVICOVAGINAL ANCILLARY ONLY
CHLAMYDIA, DNA PROBE: NEGATIVE
NEISSERIA GONORRHEA: NEGATIVE
Wet Prep (BD Affirm): POSITIVE — AB

## 2015-11-06 ENCOUNTER — Telehealth (HOSPITAL_COMMUNITY): Payer: Self-pay | Admitting: Emergency Medicine

## 2015-11-06 NOTE — Telephone Encounter (Signed)
Called pt and notified of recent lab results Pt ID'd properly... Reports still having persistent sx but it's not getting better Also taking and tolerating well Flagyl.  Adv pt if sx are not getting better to return or to f/u w/PCP, preferably and GYN  Education on safe sex given Pt verb understanding.

## 2015-11-06 NOTE — Telephone Encounter (Signed)
-----   Message from Sherlene Shams, MD sent at 11/02/2015 11:30 AM EDT ----- Please let patient know that test for gardnerella (bacterial vaginosis) was positive.   Rx for metronidazole was given at the [facility] visit 10/30/15.   Recheck or followup with pcp/Carol Vicente Serene for further evaluation if symptoms persist.  LM

## 2016-02-18 ENCOUNTER — Emergency Department (HOSPITAL_COMMUNITY)
Admission: EM | Admit: 2016-02-18 | Discharge: 2016-02-18 | Disposition: A | Discharge status: Home / Self Care | Payer: Commercial Managed Care - HMO | Attending: Emergency Medicine | Admitting: Emergency Medicine

## 2016-02-18 ENCOUNTER — Emergency Department (HOSPITAL_COMMUNITY): Payer: Commercial Managed Care - HMO

## 2016-02-18 ENCOUNTER — Encounter (HOSPITAL_COMMUNITY): Payer: Self-pay | Admitting: Family Medicine

## 2016-02-18 DIAGNOSIS — G44209 Tension-type headache, unspecified, not intractable: Secondary | ICD-10-CM | POA: Insufficient documentation

## 2016-02-18 DIAGNOSIS — I10 Essential (primary) hypertension: Secondary | ICD-10-CM

## 2016-02-18 DIAGNOSIS — Z79899 Other long term (current) drug therapy: Secondary | ICD-10-CM | POA: Diagnosis not present

## 2016-02-18 DIAGNOSIS — R079 Chest pain, unspecified: Secondary | ICD-10-CM | POA: Insufficient documentation

## 2016-02-18 DIAGNOSIS — Z87891 Personal history of nicotine dependence: Secondary | ICD-10-CM | POA: Diagnosis not present

## 2016-02-18 DIAGNOSIS — R51 Headache: Secondary | ICD-10-CM | POA: Diagnosis present

## 2016-02-18 LAB — BASIC METABOLIC PANEL
ANION GAP: 6 (ref 5–15)
BUN: 7 mg/dL (ref 6–20)
CALCIUM: 8.8 mg/dL — AB (ref 8.9–10.3)
CO2: 26 mmol/L (ref 22–32)
CREATININE: 0.63 mg/dL (ref 0.44–1.00)
Chloride: 106 mmol/L (ref 101–111)
Glucose, Bld: 99 mg/dL (ref 65–99)
Potassium: 3.5 mmol/L (ref 3.5–5.1)
SODIUM: 138 mmol/L (ref 135–145)

## 2016-02-18 LAB — CBC
HCT: 33.1 % — ABNORMAL LOW (ref 36.0–46.0)
HEMOGLOBIN: 10.9 g/dL — AB (ref 12.0–15.0)
MCH: 25.5 pg — ABNORMAL LOW (ref 26.0–34.0)
MCHC: 32.9 g/dL (ref 30.0–36.0)
MCV: 77.3 fL — ABNORMAL LOW (ref 78.0–100.0)
PLATELETS: 319 10*3/uL (ref 150–400)
RBC: 4.28 MIL/uL (ref 3.87–5.11)
RDW: 13.9 % (ref 11.5–15.5)
WBC: 6.6 10*3/uL (ref 4.0–10.5)

## 2016-02-18 MED ORDER — HYDROCHLOROTHIAZIDE 12.5 MG PO TABS: 12.5000 mg | ORAL_TABLET | Freq: Every day | ORAL | 0 refills | Status: DC

## 2016-02-18 MED ORDER — AMLODIPINE BESYLATE 5 MG PO TABS: 5.0000 mg | ORAL_TABLET | Freq: Every day | ORAL | 0 refills | Status: DC

## 2016-02-18 MED ORDER — ASPIRIN EC 325 MG PO TBEC
650.0000 mg | DELAYED_RELEASE_TABLET | Freq: Once | ORAL | Status: AC
Start: 2016-02-18 — End: 2016-02-18
  Administered 2016-02-18: 650 mg via ORAL
  Filled 2016-02-18: qty 2

## 2016-02-18 NOTE — ED Triage Notes (Signed)
Patient reports is experiencing a headache that lead into chest pain too. Pt reports her headache started after eating this morning and chest pain started after waking up from a nap this afternoon. Pt has took ALEVE 2 tablets at 18:00 with little relief.

## 2016-02-18 NOTE — ED Provider Notes (Signed)
[facility] DEPT Provider Note   CSN: [id number] Arrival date & time: 02/18/16  1927     History   Chief Complaint Chief Complaint  Patient presents with  . Headache  . Chest Pain    HPI Katrina Garcia is a 31 y.o. female.  The history is provided by the patient.  Headache   This is a recurrent problem. The current episode started 6 to 12 hours ago. The problem occurs constantly. The problem has not changed since onset.The headache is associated with nothing. The pain is located in the frontal region. The quality of the pain is described as dull. The pain is mild. The pain radiates to the left neck and right neck. Associated symptoms include chest pressure (faint, worse with movement). Pertinent negatives include no syncope, no shortness of breath, no nausea and no vomiting. She has tried nothing for the symptoms.    Past Medical History:  Diagnosis Date  . Fibroid   . Fibroid uterus 07/29/2013   Left subserosal 3.5 x 3.1 x 2.5 Left Intramural 1.2 x 1.3 x 1.4    . HPV (human papilloma virus) infection   . MDD (major depressive disorder)   . Pregnancy induced hypertension   . PTSD (post-traumatic stress disorder)     Patient Active Problem List   Diagnosis Date Noted  . Postpartum care and examination 01/22/2014  . Contraception management 01/22/2014  . Preterm delivery 01/22/2014  . Hypertension 01/22/2014  . Pregnancy complicated by fetal cerebral ventriculomegaly 10/27/2013  . Fibroid uterus 07/29/2013  . MDD (major depressive disorder) 08/12/2012  . PTSD (post-traumatic stress disorder) 08/12/2012    Past Surgical History:  Procedure Laterality Date  . CESAREAN SECTION N/A 12/18/2013   Procedure: CESAREAN SECTION;  Surgeon: Truett Mainland, DO;  Location: [facility];  Service: Obstetrics;  Laterality: N/A;  . WISDOM TOOTH EXTRACTION      OB History    Gravida Para Term Preterm AB Living   1 1   1   1    SAB TAB Ectopic Multiple Live Births         0 1        Home Medications    Prior to Admission medications   Medication Sig Start Date End Date Taking? Authorizing Provider  naproxen sodium (ANAPROX) 220 MG tablet Take 440 mg by mouth 2 (two) times daily as needed (for pain).   Yes Historical Provider, MD    Family History History reviewed. No pertinent family history.  Social History Social History  Substance Use Topics  . Smoking status: Former Smoker    Types: Cigarettes    Quit date: 09/06/2011  . Smokeless tobacco: Never Used  . Alcohol use No     Allergies   Patient has no known allergies.   Review of Systems Review of Systems  Respiratory: Negative for shortness of breath.   Cardiovascular: Negative for syncope.  Gastrointestinal: Negative for nausea and vomiting.  Neurological: Positive for headaches.  All other systems reviewed and are negative.    Physical Exam Updated Vital Signs BP (!) 180/114 (BP Location: Left Arm)   Pulse (!) 53   Temp 98.4 F (36.9 C) (Oral)   Resp 17   Ht 5\' 1"  (1.549 m)   Wt 170 lb (77.1 kg)   LMP 02/09/2016   SpO2 100%   BMI 32.12 kg/m   Physical Exam  Constitutional: She is oriented to person, place, and time. She appears well-developed and well-nourished. No distress.  HENT:  Head:  Normocephalic.  Nose: Nose normal.  Eyes: Conjunctivae are normal.  Neck: Neck supple. No tracheal deviation present.  Cardiovascular: Normal rate, regular rhythm and normal heart sounds.   Pulmonary/Chest: Effort normal and breath sounds normal. No respiratory distress.  Abdominal: Soft. She exhibits no distension.  Musculoskeletal:       Cervical back: She exhibits tenderness. She exhibits normal range of motion, no bony tenderness and no spasm.  Neurological: She is alert and oriented to person, place, and time. No cranial nerve deficit or sensory deficit. Coordination normal.  Skin: Skin is warm and dry. Capillary refill takes less than 2 seconds.  Psychiatric: She has a normal mood  and affect.     ED Treatments / Results  Labs (all labs ordered are listed, but only abnormal results are displayed) Labs Reviewed  BASIC METABOLIC PANEL - Abnormal; Notable for the following:       Result Value   Calcium 8.8 (*)    All other components within normal limits  CBC - Abnormal; Notable for the following:    Hemoglobin 10.9 (*)    HCT 33.1 (*)    MCV 77.3 (*)    MCH 25.5 (*)    All other components within normal limits  I-STAT TROPOININ, ED    EKG  EKG Interpretation  Date/Time:  Tuesday February 18 2016 21:13:31 EST Ventricular Rate:  51 PR Interval:    QRS Duration: 104 QT Interval:  433 QTC Calculation: 399 R Axis:   51 Text Interpretation:  Sinus rhythm RSR' in V1 or V2 Normal ECG No significant change since last tracing Confirmed by Royden Bulman MD, Talyn Dessert [phone]) on 02/18/2016 10:03:14 PM       Radiology Dg Chest 2 View  Result Date: 02/18/2016 CLINICAL DATA:  Central chest pain EXAM: CHEST  2 VIEW COMPARISON:  04/29/2015 CXR FINDINGS: The heart size and mediastinal contours are within normal limits. Both lungs are clear. The visualized skeletal structures are unremarkable. IMPRESSION: No active cardiopulmonary disease. Electronically Signed   By: Ashley Royalty M.D.   On: 02/18/2016 21:12    Procedures Procedures (including critical care time)  Medications Ordered in ED Medications  aspirin EC tablet 650 mg (650 mg Oral Given 02/18/16 2255)     Initial Impression / Assessment and Plan / ED Course  I have reviewed the triage vital signs and the nursing notes.  Pertinent labs & imaging results that were available during my care of the patient were reviewed by me and considered in my medical decision making (see chart for details).     31 y.o. female presents with headache that is normal for her and she gets intermittently. She also gets some mild chest pressure after work and usually this resolves with aspirin but she didn't have any so she presents for  evaluation. EKG normal, negative trop despite ongoing symptoms. Atypical for ACS. She has been noncompliant with antihypertensives with loss to follow up. Discussed the importance of this for health maintenance and recommended re-engaging PCP. 1 month of prescription provided for interim of previous meds which she tolerated well. Her main complaint today is a classic tension headache, we discussed stretching and NSAIDs to help relieve her stress and avoiding repetitive motion at work. Plan to follow up with PCP as needed and return precautions discussed for worsening or new concerning symptoms.   Final Clinical Impressions(s) / ED Diagnoses   Final diagnoses:  Tension headache  Essential hypertension  Nonspecific chest pain    New Prescriptions Discharge  Medication List as of 02/18/2016 10:42 PM    START taking these medications   Details  amLODipine (NORVASC) 5 MG tablet Take 1 tablet (5 mg total) by mouth daily., Starting Tue 02/18/2016, Print    hydrochlorothiazide (HYDRODIURIL) 12.5 MG tablet Take 1 tablet (12.5 mg total) by mouth daily., Starting Tue 02/18/2016, Print         Leo Grosser, MD 02/19/16 0157

## 2016-04-15 ENCOUNTER — Emergency Department (HOSPITAL_COMMUNITY)
Admission: EM | Admit: 2016-04-15 | Discharge: 2016-04-16 | Disposition: A | Discharge status: Home / Self Care | Payer: Commercial Managed Care - HMO | Attending: Emergency Medicine | Admitting: Emergency Medicine

## 2016-04-15 ENCOUNTER — Encounter (HOSPITAL_COMMUNITY): Payer: Self-pay | Admitting: *Deleted

## 2016-04-15 DIAGNOSIS — Z87891 Personal history of nicotine dependence: Secondary | ICD-10-CM | POA: Diagnosis not present

## 2016-04-15 DIAGNOSIS — Z79899 Other long term (current) drug therapy: Secondary | ICD-10-CM | POA: Diagnosis not present

## 2016-04-15 DIAGNOSIS — R05 Cough: Secondary | ICD-10-CM | POA: Diagnosis present

## 2016-04-15 DIAGNOSIS — J209 Acute bronchitis, unspecified: Secondary | ICD-10-CM | POA: Insufficient documentation

## 2016-04-15 NOTE — ED Triage Notes (Signed)
Pt reports cough and nasal congestion/drainage for about 1 week, did have fevers last week that have subsided. c/o pain in her chest when she coughs. No meds PTA. Lungs clear, no acute distress in triage.

## 2016-04-15 NOTE — ED Notes (Addendum)
Pt has been coughing x 2 days with productive white sputum pt reports a burning sensation in throat, and worsens with intake of soda or OJ. Pt does report improvement with taking alka-selzer.

## 2016-04-16 MED ORDER — ALBUTEROL SULFATE HFA 108 (90 BASE) MCG/ACT IN AERS
2.0000 | INHALATION_SPRAY | RESPIRATORY_TRACT | Status: DC | PRN
  Administered 2016-04-16: 2 via RESPIRATORY_TRACT
  Filled 2016-04-16: qty 6.7

## 2016-04-16 NOTE — ED Provider Notes (Signed)
[facility] DEPT Provider Note: Georgena Spurling, MD, FACEP  CSN: [id number] MRN: [id number] ARRIVAL: 04/15/16 at [place]: [id number]/[id number]  By signing my name below, I, Margit Banda, attest that this documentation has been prepared under the direction and in the presence of Shanon Rosser, MD. Electronically Signed: Margit Banda, ED Scribe. 04/16/16. 12:32 AM.  CHIEF COMPLAINT  Cough   HISTORY OF PRESENT ILLNESS  Katrina Garcia is a 31 y.o. female with a PMHx of HPV, PTSD and fibroid. Pt reports to the Emergency Department c/o a cough a and shortness of breath that started at 4 am yesterday. She has been sick with cold symptoms (fever, rhinorrhea, nasal congestion, sneezing, sore throat) for the past week which are improving. Lying on her back exacerbates her pain. Lying on her side and taking alka seltzer improves her sx. Pt denies having asthma or using an inhaler.  Past Medical History:  Diagnosis Date  . Fibroid   . Fibroid uterus 07/29/2013   Left subserosal 3.5 x 3.1 x 2.5 Left Intramural 1.2 x 1.3 x 1.4    . HPV (human papilloma virus) infection   . MDD (major depressive disorder)   . Pregnancy induced hypertension   . PTSD (post-traumatic stress disorder)     Past Surgical History:  Procedure Laterality Date  . CESAREAN SECTION N/A 12/18/2013   Procedure: CESAREAN SECTION;  Surgeon: Truett Mainland, DO;  Location: [facility];  Service: Obstetrics;  Laterality: N/A;  . WISDOM TOOTH EXTRACTION      No family history on file.  Social History  Substance Use Topics  . Smoking status: Former Smoker    Types: Cigarettes    Quit date: 09/06/2011  . Smokeless tobacco: Never Used  . Alcohol use No    Prior to Admission medications   Medication Sig Start Date End Date Taking? Authorizing Provider  amLODipine (NORVASC) 5 MG tablet Take 1 tablet (5 mg total) by mouth daily. 02/18/16   Leo Grosser, MD  hydrochlorothiazide (HYDRODIURIL) 12.5 MG tablet Take 1 tablet (12.5 mg total) by  mouth daily. 02/18/16   Leo Grosser, MD  naproxen sodium (ANAPROX) 220 MG tablet Take 440 mg by mouth 2 (two) times daily as needed (for pain).    Historical Provider, MD    Allergies Patient has no known allergies.   REVIEW OF SYSTEMS  Negative except as noted here or in the History of Present Illness.   PHYSICAL EXAMINATION  Initial Vital Signs Blood pressure (!) 158/100, pulse 60, temperature 98.2 F (36.8 C), temperature source Oral, resp. rate 18, height 5\' 1"  (1.549 m), weight 178 lb (80.7 kg), last menstrual period 04/05/2016, SpO2 100 %.  Examination General: Well-developed, well-nourished female in no acute distress; appearance consistent with age of record HENT: normocephalic; atraumatic; no pharyngeal erythema or exudate  Eyes: pupils equal, round and reactive to light; extraocular muscles intact Neck: supple Heart: regular rate and rhythm;  Lungs: mildly decreased air movement bilaterally, most prominently in the right base Abdomen: soft; nondistended; nontender; no masses or hepatosplenomegaly; bowel sounds present Extremities: No deformity; full range of motion; pulses normal Neurologic: Awake, alert and oriented; motor function intact in all extremities and symmetric; no facial droop Skin: Warm and dry Psychiatric: Normal mood and affect   RESULTS  Summary of this visit's results, reviewed by myself:   EKG Interpretation  Date/Time:    Ventricular Rate:    PR Interval:    QRS Duration:   QT Interval:    QTC Calculation:  R Axis:     Text Interpretation:        Laboratory Studies: No results found for this or any previous visit (from the past 24 hour(s)). Imaging Studies: No results found.  ED COURSE  Nursing notes and initial vitals signs, including pulse oximetry, reviewed.  Vitals:   04/15/16 2005 04/15/16 2006 04/15/16 2357 04/16/16 0001  BP: (!) 154/84   (!) 158/100  Pulse: 68   60  Resp: 18   18  Temp: 98.2 F (36.8 C)   98.2 F (36.8  C)  TempSrc: Oral   Oral  SpO2: 100%  100% 100%  Weight:  178 lb (80.7 kg)    Height:  5\' 1"  (1.549 m)     1:44 AM Improved air movement and subjective relief of symptoms after albuterol inhaler therapy.  PROCEDURES    ED DIAGNOSES     ICD-9-CM ICD-10-CM   1. Acute bronchitis with bronchospasm 466.0 J20.9     I personally performed the services described in this documentation, which was scribed in my presence. The recorded information has been reviewed and is accurate.     Shanon Rosser, MD 04/16/16 [phone]

## 2016-04-23 ENCOUNTER — Emergency Department (HOSPITAL_COMMUNITY)
Admission: EM | Admit: 2016-04-23 | Discharge: 2016-04-23 | Disposition: A | Discharge status: Home / Self Care | Payer: Commercial Managed Care - HMO | Attending: Emergency Medicine | Admitting: Emergency Medicine

## 2016-04-23 ENCOUNTER — Encounter (HOSPITAL_COMMUNITY): Payer: Self-pay | Admitting: *Deleted

## 2016-04-23 DIAGNOSIS — I10 Essential (primary) hypertension: Secondary | ICD-10-CM | POA: Diagnosis not present

## 2016-04-23 DIAGNOSIS — R51 Headache: Secondary | ICD-10-CM | POA: Insufficient documentation

## 2016-04-23 DIAGNOSIS — Z79899 Other long term (current) drug therapy: Secondary | ICD-10-CM | POA: Insufficient documentation

## 2016-04-23 DIAGNOSIS — R519 Headache, unspecified: Secondary | ICD-10-CM

## 2016-04-23 DIAGNOSIS — Z87891 Personal history of nicotine dependence: Secondary | ICD-10-CM | POA: Diagnosis not present

## 2016-04-23 MED ORDER — NAPROXEN 250 MG PO TABS
375.0000 mg | ORAL_TABLET | Freq: Once | ORAL | Status: AC
  Administered 2016-04-23: 375 mg via ORAL
  Filled 2016-04-23: qty 2

## 2016-04-23 MED ORDER — CYCLOBENZAPRINE HCL 10 MG PO TABS: 10.0000 mg | ORAL_TABLET | Freq: Two times a day (BID) | ORAL | 0 refills | Status: DC | PRN

## 2016-04-23 MED ORDER — HYDROCHLOROTHIAZIDE 25 MG PO TABS: 25.0000 mg | ORAL_TABLET | Freq: Every day | ORAL | 1 refills | Status: DC

## 2016-04-23 NOTE — Discharge Instructions (Signed)
Follow-up with your primary care doctor, take the medications as prescribed °

## 2016-04-23 NOTE — ED Notes (Signed)
c/o headache and lower back pain  X 1 1/2 weeks, States her neck would get stiff after she eats. c/o dizziness today

## 2016-04-23 NOTE — ED Triage Notes (Signed)
Multiple complaints. Reports ongoing headaches, has been out of bp meds for extended time. Reports feeling dizzy today and having lower back pain that increases with movement. Recent generalized fatigue. No acute distress noted at triage.

## 2016-04-23 NOTE — ED Provider Notes (Signed)
[facility] DEPT Provider Note   CSN: [id number] Arrival date & time: 04/23/16  1505   By signing my name below, I, Evelene Croon, attest that this documentation has been prepared under the direction and in the presence of Dorie Rank, MD . Electronically Signed: Evelene Croon, Scribe. 04/23/2016. 4:47 PM.   History   Chief Complaint Chief Complaint  Patient presents with  . Back Pain  . Dizziness  . Headache   The history is provided by the patient. No language interpreter was used.    HPI Comments:  Katrina Garcia is a 31 y.o. female who presents to the Emergency Department complaining of dizziness that began after eating today. She reports associated  nausea and intermittent mild HA x 1 week. She notes h/o tension HA; denies h/o migraines. Pt also notes back pain at this time; states she often lifts at work.  No alleviating factors noted. She denies vomiting. No recent fall/trauma/head injury.   Pt also notes she has been off her BP meds for a little less than 1 month. She states she was taking amlodipine and HCTZ. She has a new patient appointment with a PCP at the end of April 2018.   Past Medical History:  Diagnosis Date  . Fibroid   . Fibroid uterus 07/29/2013   Left subserosal 3.5 x 3.1 x 2.5 Left Intramural 1.2 x 1.3 x 1.4    . HPV (human papilloma virus) infection   . MDD (major depressive disorder)   . Pregnancy induced hypertension   . PTSD (post-traumatic stress disorder)     Patient Active Problem List   Diagnosis Date Noted  . Postpartum care and examination 01/22/2014  . Contraception management 01/22/2014  . Preterm delivery 01/22/2014  . Hypertension 01/22/2014  . Pregnancy complicated by fetal cerebral ventriculomegaly 10/27/2013  . Fibroid uterus 07/29/2013  . MDD (major depressive disorder) 08/12/2012  . PTSD (post-traumatic stress disorder) 08/12/2012    Past Surgical History:  Procedure Laterality Date  . CESAREAN SECTION N/A 12/18/2013   Procedure: CESAREAN SECTION;  Surgeon: Truett Mainland, DO;  Location: [facility];  Service: Obstetrics;  Laterality: N/A;  . WISDOM TOOTH EXTRACTION      OB History    Gravida Para Term Preterm AB Living   1 1   1   1    SAB TAB Ectopic Multiple Live Births         0 1       Home Medications    Prior to Admission medications   Medication Sig Start Date End Date Taking? Authorizing Provider  amLODipine (NORVASC) 5 MG tablet Take 1 tablet (5 mg total) by mouth daily. 02/18/16   Leo Grosser, MD  hydrochlorothiazide (HYDRODIURIL) 25 MG tablet Take 1 tablet (25 mg total) by mouth daily. 04/23/16   Dorie Rank, MD  naproxen sodium (ANAPROX) 220 MG tablet Take 440 mg by mouth 2 (two) times daily as needed (for pain).    Historical Provider, MD    Family History History reviewed. No pertinent family history.  Social History Social History  Substance Use Topics  . Smoking status: Former Smoker    Types: Cigarettes    Quit date: 09/06/2011  . Smokeless tobacco: Never Used  . Alcohol use No     Allergies   Patient has no known allergies.   Review of Systems Review of Systems  Gastrointestinal: Positive for nausea. Negative for vomiting.  Musculoskeletal: Positive for back pain.  Neurological: Positive for dizziness and headaches. Negative for weakness  and numbness.     Physical Exam Updated Vital Signs BP 110/79 (BP Location: Right Arm)   Pulse (!) 103   Temp 97.9 F (36.6 C) (Oral)   Resp 18   LMP 04/05/2016   SpO2 100%   Physical Exam  Constitutional: She appears well-developed and well-nourished. No distress.  HENT:  Head: Normocephalic and atraumatic.  Right Ear: External ear normal.  Left Ear: External ear normal.  Eyes: Conjunctivae are normal. Right eye exhibits no discharge. Left eye exhibits no discharge. No scleral icterus.  Neck: Neck supple. No tracheal deviation present.  Cardiovascular: Normal rate, regular rhythm and intact distal pulses.     Pulmonary/Chest: Effort normal and breath sounds normal. No stridor. No respiratory distress. She has no wheezes. She has no rales.  Abdominal: Soft. Bowel sounds are normal. She exhibits no distension. There is no tenderness. There is no rebound and no guarding.  Musculoskeletal: She exhibits no edema or tenderness.  Neurological: She is alert. She has normal strength. No cranial nerve deficit (no facial droop, extraocular movements intact, no slurred speech) or sensory deficit. She exhibits normal muscle tone. She displays no seizure activity. Coordination normal.  Skin: Skin is warm and dry. No rash noted.  Psychiatric: She has a normal mood and affect.  Nursing note and vitals reviewed.    ED Treatments / Results  DIAGNOSTIC STUDIES:  Oxygen Saturation is 100% on RA, normal by my interpretation.    COORDINATION OF CARE:  4:47 PM Discussed treatment plan with pt at bedside and pt agreed to plan.   Procedures Procedures (including critical care time)  Medications Ordered in ED Medications  naproxen (NAPROSYN) tablet 375 mg (not administered)     Initial Impression / Assessment and Plan / ED Course  I have reviewed the triage vital signs and the nursing notes.  Pertinent labs & imaging results that were available during my care of the patient were reviewed by me and considered in my medical decision making (see chart for details).   patient presented to the emergency room with complaints of headache, lower back pain and hypertension. Patient's exam is reassuring. She has no deficits. I doubt serious etiology regarding her headache. She also has some mild low back pain. Patient does a lot of lifting on her job. I suspect her symptoms are musculoskeletal.  Patient does have a history of hypertension. I will give her prescription for hydrochlorothiazide which she used to take.  Final Clinical Impressions(s) / ED Diagnoses   Final diagnoses:  Acute nonintractable headache,  unspecified headache type    New Prescriptions New Prescriptions   HYDROCHLOROTHIAZIDE (HYDRODIURIL) 25 MG TABLET    Take 1 tablet (25 mg total) by mouth daily.  I personally performed the services described in this documentation, which was scribed in my presence.  The recorded information has been reviewed and is accurate.     Dorie Rank, MD 04/23/16 2104

## 2016-05-04 ENCOUNTER — Ambulatory Visit (INDEPENDENT_AMBULATORY_CARE_PROVIDER_SITE_OTHER): Payer: Commercial Managed Care - HMO | Admitting: Nurse Practitioner

## 2016-05-04 ENCOUNTER — Other Ambulatory Visit (HOSPITAL_COMMUNITY)
Admission: RE | Admit: 2016-05-04 | Discharge: 2016-05-04 | Disposition: A | Discharge status: Home / Self Care | Payer: Commercial Managed Care - HMO | Source: Ambulatory Visit | Attending: Nurse Practitioner | Admitting: Nurse Practitioner

## 2016-05-04 ENCOUNTER — Encounter: Payer: Self-pay | Admitting: Nurse Practitioner

## 2016-05-04 ENCOUNTER — Other Ambulatory Visit (INDEPENDENT_AMBULATORY_CARE_PROVIDER_SITE_OTHER): Payer: Commercial Managed Care - HMO

## 2016-05-04 VITALS — BP 136/90 | HR 53 | Temp 98.2°F | Ht 61.0 in | Wt 178.0 lb

## 2016-05-04 DIAGNOSIS — R739 Hyperglycemia, unspecified: Secondary | ICD-10-CM

## 2016-05-04 DIAGNOSIS — I10 Essential (primary) hypertension: Secondary | ICD-10-CM | POA: Diagnosis not present

## 2016-05-04 DIAGNOSIS — L304 Erythema intertrigo: Secondary | ICD-10-CM

## 2016-05-04 DIAGNOSIS — Z01419 Encounter for gynecological examination (general) (routine) without abnormal findings: Secondary | ICD-10-CM | POA: Insufficient documentation

## 2016-05-04 DIAGNOSIS — Z136 Encounter for screening for cardiovascular disorders: Secondary | ICD-10-CM

## 2016-05-04 DIAGNOSIS — Z3009 Encounter for other general counseling and advice on contraception: Secondary | ICD-10-CM

## 2016-05-04 DIAGNOSIS — Z124 Encounter for screening for malignant neoplasm of cervix: Secondary | ICD-10-CM

## 2016-05-04 DIAGNOSIS — R8290 Unspecified abnormal findings in urine: Secondary | ICD-10-CM | POA: Diagnosis not present

## 2016-05-04 DIAGNOSIS — Z0001 Encounter for general adult medical examination with abnormal findings: Secondary | ICD-10-CM

## 2016-05-04 DIAGNOSIS — M545 Low back pain, unspecified: Secondary | ICD-10-CM

## 2016-05-04 DIAGNOSIS — Z13220 Encounter for screening for lipoid disorders: Secondary | ICD-10-CM | POA: Diagnosis not present

## 2016-05-04 LAB — POCT URINALYSIS DIPSTICK
Bilirubin, UA: NEGATIVE
Glucose, UA: NEGATIVE
KETONES UA: NEGATIVE
Leukocytes, UA: NEGATIVE
Nitrite, UA: POSITIVE
PH UA: 6 (ref 5.0–8.0)
PROTEIN UA: NEGATIVE
RBC UA: NEGATIVE
Urobilinogen, UA: 0.2 E.U./dL

## 2016-05-04 LAB — POCT URINE PREGNANCY: PREG TEST UR: NEGATIVE

## 2016-05-04 LAB — CBC WITH DIFFERENTIAL/PLATELET
BASOS ABS: 0.1 10*3/uL (ref 0.0–0.1)
Basophils Relative: 0.8 % (ref 0.0–3.0)
EOS ABS: 0.2 10*3/uL (ref 0.0–0.7)
Eosinophils Relative: 3.1 % (ref 0.0–5.0)
HEMATOCRIT: 35.1 % — AB (ref 36.0–46.0)
Hemoglobin: 11.4 g/dL — ABNORMAL LOW (ref 12.0–15.0)
Lymphocytes Relative: 29.2 % (ref 12.0–46.0)
Lymphs Abs: 1.9 10*3/uL (ref 0.7–4.0)
MCHC: 32.6 g/dL (ref 30.0–36.0)
MCV: 78.1 fl (ref 78.0–100.0)
Monocytes Absolute: 0.5 10*3/uL (ref 0.1–1.0)
Monocytes Relative: 8.3 % (ref 3.0–12.0)
NEUTROS ABS: 3.8 10*3/uL (ref 1.4–7.7)
NEUTROS PCT: 58.6 % (ref 43.0–77.0)
PLATELETS: 293 10*3/uL (ref 150.0–400.0)
RBC: 4.5 Mil/uL (ref 3.87–5.11)
RDW: 14.4 % (ref 11.5–15.5)
WBC: 6.6 10*3/uL (ref 4.0–10.5)

## 2016-05-04 LAB — LIPID PANEL
CHOLESTEROL: 125 mg/dL (ref 0–200)
HDL: 45.8 mg/dL (ref >39.00)
LDL CALC: 64 mg/dL (ref 0–99)
NonHDL: 79.31
TRIGLYCERIDES: 77 mg/dL (ref 0.0–149.0)
Total CHOL/HDL Ratio: 3
VLDL: 15.4 mg/dL (ref 0.0–40.0)

## 2016-05-04 LAB — HEMOGLOBIN A1C: Hgb A1c MFr Bld: 6.2 % (ref 4.6–6.5)

## 2016-05-04 LAB — TSH: TSH: 0.92 u[IU]/mL (ref 0.35–4.50)

## 2016-05-04 MED ORDER — CLOTRIMAZOLE-BETAMETHASONE 1-0.05 % EX LOTN: TOPICAL_LOTION | Freq: Two times a day (BID) | CUTANEOUS | 0 refills | Status: DC

## 2016-05-04 MED ORDER — FLUCONAZOLE 150 MG PO TABS: 150.0000 mg | ORAL_TABLET | Freq: Every day | ORAL | 0 refills | Status: DC

## 2016-05-04 MED ORDER — NAPROXEN 500 MG PO TABS: 500.0000 mg | ORAL_TABLET | Freq: Two times a day (BID) | ORAL | 0 refills | Status: DC | PRN

## 2016-05-04 NOTE — Progress Notes (Signed)
Subjective:    Patient ID: Adonis Housekeeper, female    DOB: 1985/06/28, 31 y.o.   MRN: [id number]  Patient presents today for complete physical  HPI   Wants CPE with breast exam and PAP smear.  No previous pcp.  Back pain: Intermittent over the last 2years. No hx of injury. No radiation of pain, occassional dysuria. No pain with intercourse. No change in GI function.  Contraceptive: She will like prescription for nuvaring or OCP (seasonique). Will like to check which is covered by insurance. Current use of condoms all the time.  Immunizations: (TDAP, Hep C screen, Pneumovax, Influenza, zoster)  Health Maintenance  Topic Date Due  . Pap Smear  07/19/2016  . Flu Shot  08/05/2016  . Tetanus Vaccine  11/02/2023  . HIV Screening  Completed   Diet:regular Weight:  Wt Readings from Last 3 Encounters:  05/04/16 178 lb (80.7 kg)  04/15/16 178 lb (80.7 kg)  02/18/16 170 lb (77.1 kg)   Exercise:none Fall Risk: Fall Risk  05/04/2016 11/22/2013  Falls in the past year? No No   Home Safety:home with son and husband, works for [employer].  Depression/Suicide: Depression screen [facility] 2/9 05/04/2016 11/22/2013  Decreased Interest 0 0  Down, Depressed, Hopeless 0 0  PHQ - 2 Score 0 0   No flowsheet data found.  Pap Smear (every 48yrs for >21-29 without HPV, every 3yrs for >30-16yrs with HPV): needed Vision:needed Dental:needed Advanced Directive: Advanced Directives 04/15/2016  Does Patient Have a Medical Advance Directive? No  Would patient like information on creating a medical advance directive? No - Patient declined  Pre-existing out of facility DNR order (yellow form or pink MOST form) -  Some encounter information is confidential and restricted. Go to Review Flowsheets activity to see all data.   Sexual History (birth control, marital status, STD):sexually active, condom use at this time, will like to go back on nuvaring and seasonique.    Medications and allergies  reviewed with patient and updated if appropriate.  Patient Active Problem List   Diagnosis Date Noted  . Postpartum care and examination 01/22/2014  . Contraception management 01/22/2014  . Preterm delivery 01/22/2014  . Hypertension 01/22/2014  . Pregnancy complicated by fetal cerebral ventriculomegaly 10/27/2013  . Fibroid uterus 07/29/2013  . MDD (major depressive disorder) 08/12/2012  . PTSD (post-traumatic stress disorder) 08/12/2012    Current Outpatient Prescriptions on File Prior to Visit  Medication Sig Dispense Refill  . hydrochlorothiazide (HYDRODIURIL) 25 MG tablet Take 1 tablet (25 mg total) by mouth daily. 30 tablet 1  . [DISCONTINUED] dicyclomine (BENTYL) 20 MG tablet Take 1 tablet (20 mg total) by mouth every 6 (six) hours as needed for spasms. (Patient not taking: Reported on 04/29/2015) 12 tablet 0  . [DISCONTINUED] etonogestrel-ethinyl estradiol (NUVARING) 0.12-0.015 MG/24HR vaginal ring Insert vaginally and leave in place for 3 consecutive weeks, then remove for 1 week. (Patient not taking: Reported on 06/21/2014) 1 each 12  . [DISCONTINUED] triamterene-hydrochlorothiazide (MAXZIDE-25) 37.5-25 MG per tablet Take 2 tablets by mouth daily. (Patient not taking: Reported on 06/21/2014) 60 tablet 3   No current facility-administered medications on file prior to visit.     Past Medical History:  Diagnosis Date  . Fibroid   . Fibroid uterus 07/29/2013   Left subserosal 3.5 x 3.1 x 2.5 Left Intramural 1.2 x 1.3 x 1.4    . HPV (human papilloma virus) infection   . MDD (major depressive disorder)   . Migraines   . Pregnancy  induced hypertension   . PTSD (post-traumatic stress disorder)     Past Surgical History:  Procedure Laterality Date  . CESAREAN SECTION N/A 12/18/2013   Procedure: CESAREAN SECTION;  Surgeon: Truett Mainland, DO;  Location: [facility];  Service: Obstetrics;  Laterality: N/A;  . WISDOM TOOTH EXTRACTION      Social History   Social History  . Marital  status: Single    Spouse name: N/A  . Number of children: N/A  . Years of education: N/A   Social History Main Topics  . Smoking status: Former Smoker    Types: Cigarettes    Quit date: 09/06/2011  . Smokeless tobacco: Never Used  . Alcohol use No  . Drug use: No  . Sexual activity: Yes    Birth control/ protection: None     Comment: last sex today  0500   Other Topics Concern  . None   Social History Narrative  . None    Family History  Problem Relation Age of Onset  . Hypertension Father   . Cancer Paternal Grandmother     breast cancer  . Hypertension Paternal Grandmother         Review of Systems  Constitutional: Negative for fever, malaise/fatigue and weight loss.  HENT: Negative for congestion and sore throat.   Eyes:       Negative for visual changes  Respiratory: Negative for cough and shortness of breath.   Cardiovascular: Negative for chest pain, palpitations and leg swelling.  Gastrointestinal: Negative for blood in stool, constipation, diarrhea and heartburn.  Genitourinary: Negative for dysuria, frequency and urgency.  Musculoskeletal: Positive for back pain. Negative for falls, joint pain and myalgias.  Skin: Positive for itching and rash.  Neurological: Negative for dizziness, sensory change and headaches.  Endo/Heme/Allergies: Does not bruise/bleed easily.  Psychiatric/Behavioral: Negative for depression, substance abuse and suicidal ideas. The patient is not nervous/anxious.     Objective:   Vitals:   05/04/16 1117  BP: 136/90  Pulse: (!) 53  Temp: 98.2 F (36.8 C)    Body mass index is 33.63 kg/m.   Physical Examination:  Physical Exam  Constitutional: She is oriented to person, place, and time and well-developed, well-nourished, and in no distress. No distress.  HENT:  Right Ear: External ear normal.  Left Ear: External ear normal.  Nose: Nose normal.  Mouth/Throat: No oropharyngeal exudate.  Eyes: Conjunctivae and EOM are  normal. Pupils are equal, round, and reactive to light. No scleral icterus.  Neck: Normal range of motion. Neck supple. No thyromegaly present.  Cardiovascular: Normal rate, regular rhythm, normal heart sounds and intact distal pulses.   Pulmonary/Chest: Effort normal and breath sounds normal. Right breast exhibits no inverted nipple, no mass, no nipple discharge, no skin change and no tenderness. Left breast exhibits no inverted nipple, no mass, no nipple discharge, no skin change and no tenderness. Breasts are symmetrical.  Abdominal: Soft. Bowel sounds are normal. She exhibits no distension. There is no tenderness.  Genitourinary: Rectum normal, cervix normal, right adnexa normal, left adnexa normal and vulva normal. Rectal exam shows no external hemorrhoid and no internal hemorrhoid. Uterus is not tender. Cervix exhibits no motion tenderness and no tenderness. Thick  odorless  white and vaginal discharge found.  Musculoskeletal: Normal range of motion. She exhibits no edema or tenderness.  Lymphadenopathy:    She has no cervical adenopathy.  Neurological: She is alert and oriented to person, place, and time. Gait normal.  Skin: Skin is warm  and dry. Lesion and rash noted. Rash is macular. No erythema.     Hyperpigmented marcerated lesion under breast, groin and axillary regions  Psychiatric: Affect and judgment normal.  Vitals reviewed.   ASSESSMENT and PLAN:  Markesha was seen today for establish care.  Diagnoses and all orders for this visit:  Encounter for preventative adult health care exam with abnormal findings -     CBC w/Diff; Future -     TSH; Future -     Lipid panel; Future -     Comprehensive metabolic panel -     Cytology - PAP  Essential hypertension  Acute bilateral low back pain without sciatica -     POCT urinalysis dipstick -     naproxen (NAPROSYN) 500 MG tablet; Take 1 tablet (500 mg total) by mouth 2 (two) times daily as needed (for pain, take with  food).  Intertrigo -     clotrimazole-betamethasone (LOTRISONE) lotion; Apply topically 2 (two) times daily. -     fluconazole (DIFLUCAN) 150 MG tablet; Take 1 tablet (150 mg total) by mouth daily. Take second tab 3days apart from first tab  Encounter for lipid screening for cardiovascular disease -     Lipid panel; Future  Hyperglycemia -     Hemoglobin A1c; Future  Encounter for other general counseling and advice on contraception -     POCT urine pregnancy  Encounter for Papanicolaou smear for cervical cancer screening -     Cytology - PAP  Abnormal finding on urinalysis -     Urine culture; Future    No problem-specific Assessment & Plan notes found for this encounter.     Follow up: Return if symptoms worsen or fail to improve.  Wilfred Lacy, NP

## 2016-05-04 NOTE — Progress Notes (Signed)
Pre visit review using our clinic review tool, if applicable. No additional management support is needed unless otherwise documented below in the visit note. 

## 2016-05-04 NOTE — Patient Instructions (Addendum)
Go to basement for blood draw.  Please call office with name of contraceptive covered by insurance, call within the next 5days.  Intertrigo Intertrigo is skin irritation or inflammation (dermatitis) that occurs when folds of skin rub together. The irritation can cause a rash and make skin raw and itchy. This condition most commonly occurs in the skin folds of these areas:  Toes.  Armpits.  Groin.  Belly.  Breasts.  Buttocks. Intertrigo is not passed from person to person (is not contagious). What are the causes? This condition is caused by heat, moisture, friction, and lack of air circulation. The condition can be made worse by:  Sweat.  Bacteria or a fungus, such as yeast. What increases the risk? This condition is more likely to occur if you have moisture in your skin folds. It is also more likely to develop in people who:  Have diabetes.  Are overweight.  Are on bed rest.  Live in a warm and moist climate.  Wear splints, braces, or other medical devices.  Are not able to control their bowels or bladder (have incontinence). What are the signs or symptoms? Symptoms of this condition include:  A pink or red skin rash.  Brown patches on the skin.  Raw or scaly skin.  Itchiness.  A burning feeling.  Bleeding.  Leaking fluid.  A bad smell. How is this diagnosed? This condition is diagnosed with a medical history and physical exam. You may also have a skin swab to test for bacteria or a fungus, such as yeast. How is this treated? Treatment may include:  Cleaning and drying your skin.  An oral antibiotic medicine or antibiotic skin cream for a bacterial infection.  Antifungal cream or pills for an infection that was caused by a fungus, such as yeast.  Steroid ointment to relieve itchiness and irritation. Follow these instructions at home:  Keep the affected area clean and dry.  Do not scratch your skin.  Stay in a cool environment as much as  possible. Use an air conditioner or fan, if available.  Apply over-the-counter and prescription medicines only as told by your health care provider.  If you were prescribed an antibiotic medicine, use it as told by your health care provider. Do not stop using the antibiotic even if your condition improves.  Keep all follow-up visits as told by your health care provider. This is important. How is this prevented?  Maintain a healthy weight.  Take care of your feet, especially if you have diabetes. Foot care includes:  Wearing shoes that fit well.  Keeping your feet dry.  Wearing clean, breathable socks.  Protect the skin around your groin and buttocks, especially if you have incontinence. Skin protection includes:  Following a regular cleaning routine.  Using moisturizers and skin protectants.  Changing protection pads frequently.  Do not wear tight clothes. Wear clothes that are loose and absorbent. Wear clothes that are made of cotton.  Wear a bra that gives good support, if needed.  Shower and dry yourself thoroughly after activity. Use a hair dryer on a cool setting to dry between skin folds, especially after you bathe.  If you have diabetes, keep your blood sugar under control. Contact a health care provider if:  Your symptoms do not improve with treatment.  Your symptoms get worse or they spread.  You notice increased redness and warmth.  You have a fever. This information is not intended to replace advice given to you by your health care provider. Make  sure you discuss any questions you have with your health care provider. Document Released: 12/22/2004 Document Revised: 05/30/2015 Document Reviewed: 06/25/2014 Elsevier Interactive Patient Education  2017 [employer].

## 2016-05-05 LAB — CYTOLOGY - PAP: Diagnosis: NEGATIVE

## 2016-05-05 IMAGING — US US OB FOLLOW-UP
1 series · 12 of 28 positions shown · non-contrast
Comparison: none

[Series 1: us ob follow-up · 0.26mm/px · 12 of 56 slices shown]
[im 3/56]
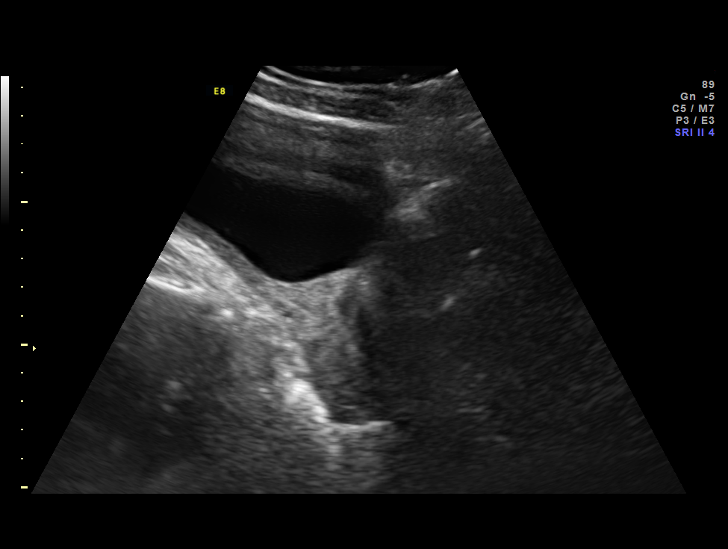
[im 7/56]
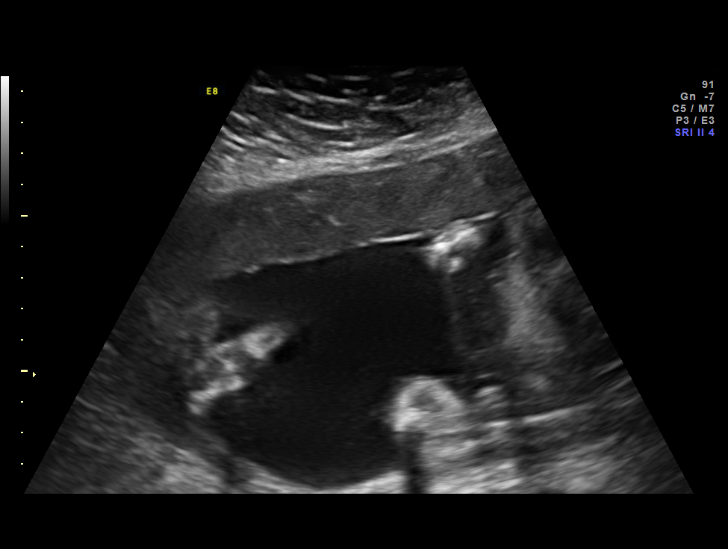
[im 11/56]
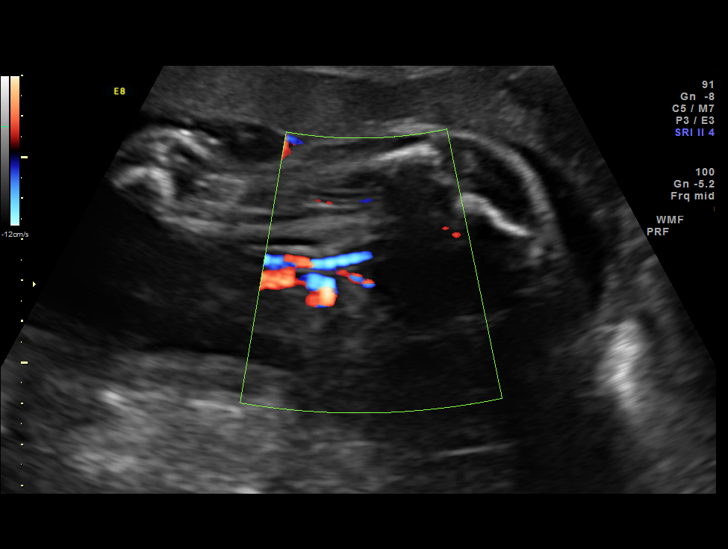
[im 17/56]
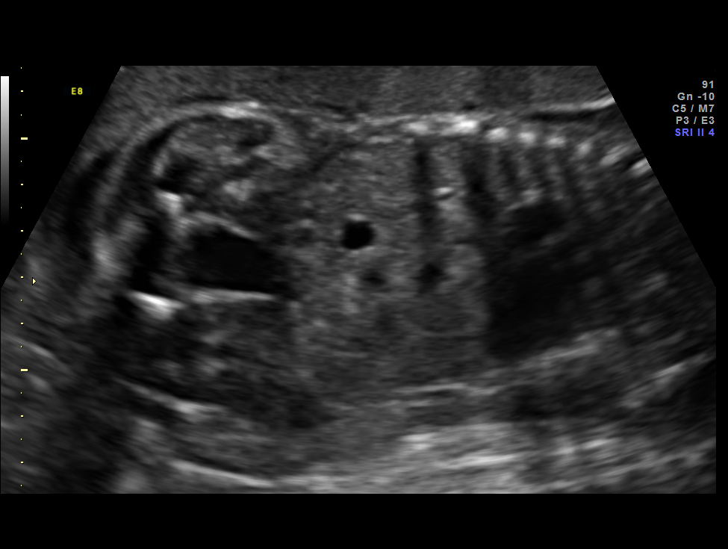
[im 21/56]
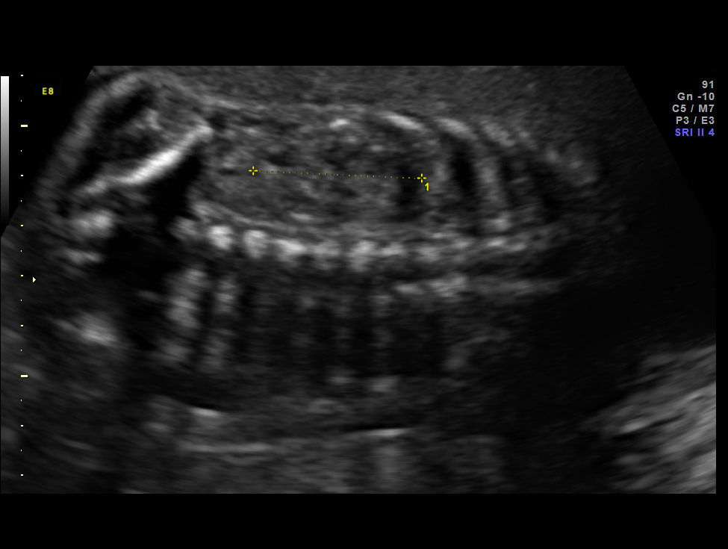
[im 25/56]
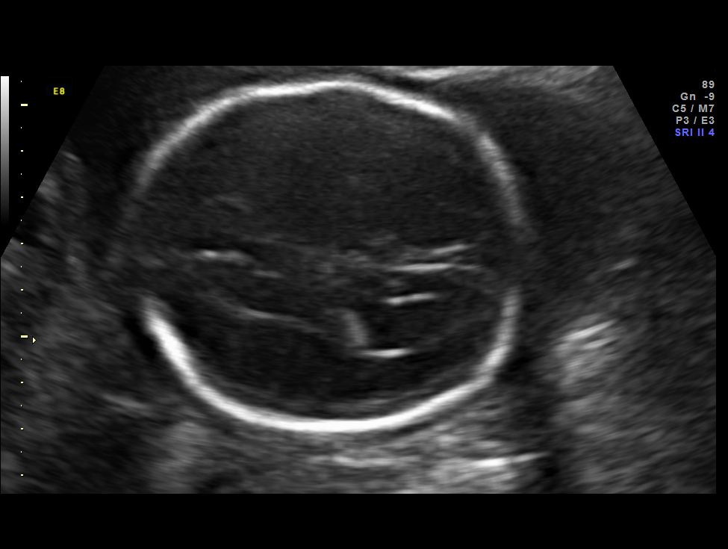
[im 31/56]
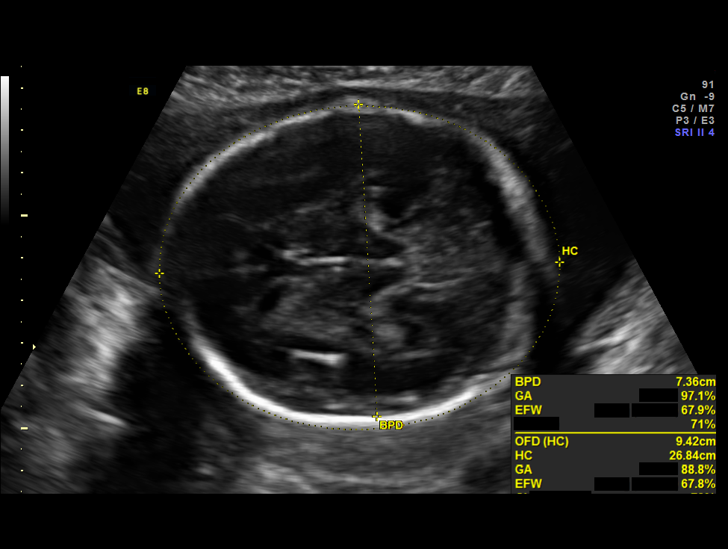
[im 35/56]
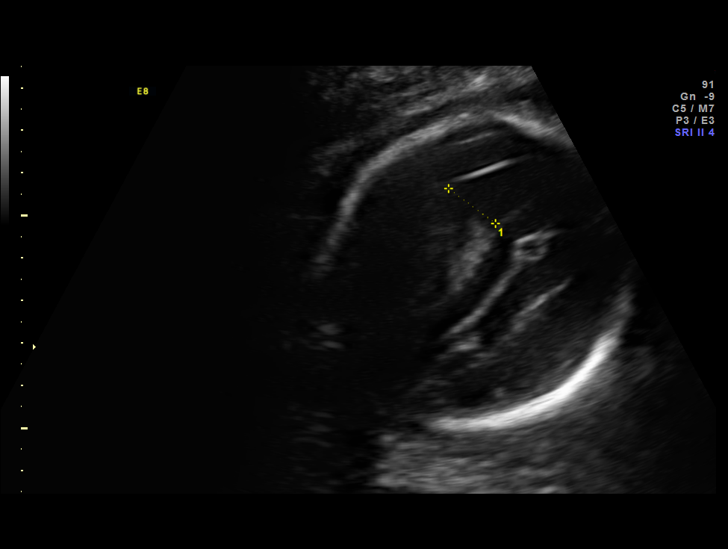
[im 39/56]
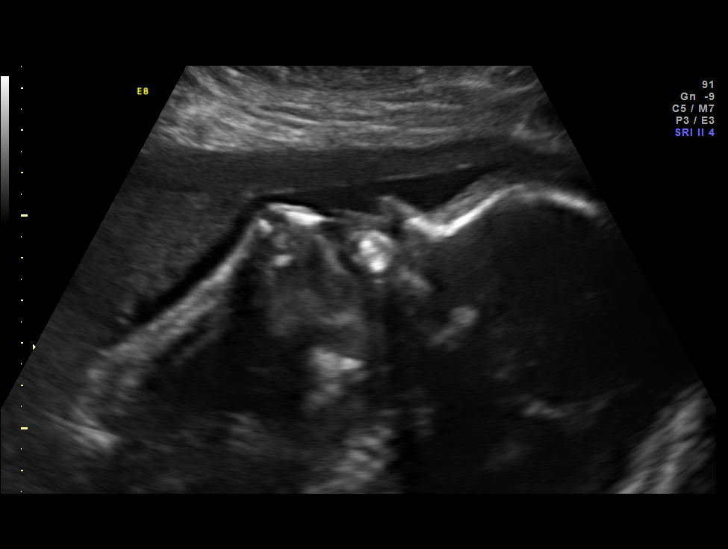
[im 45/56]
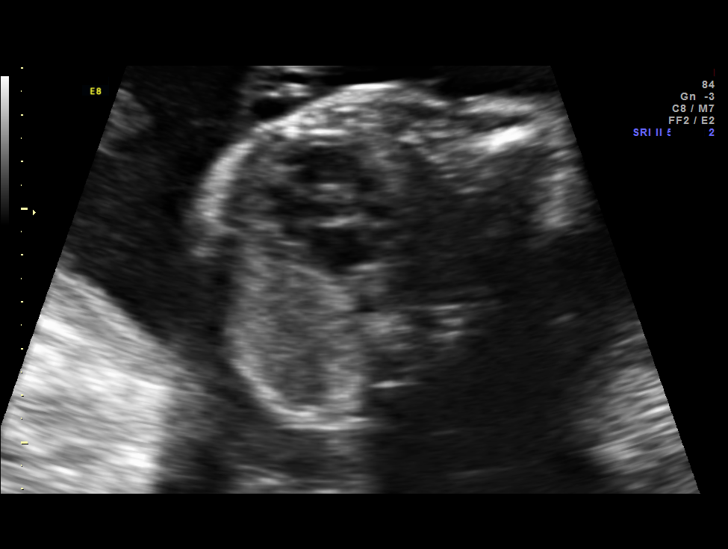
[im 49/56]
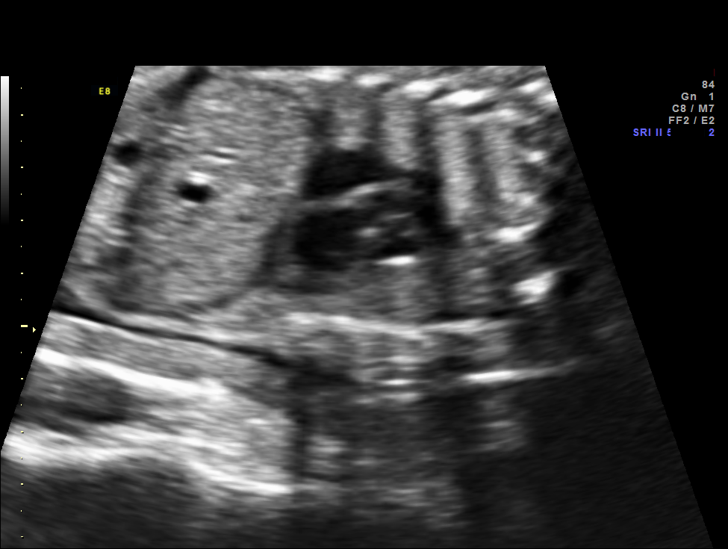
[im 53/56]
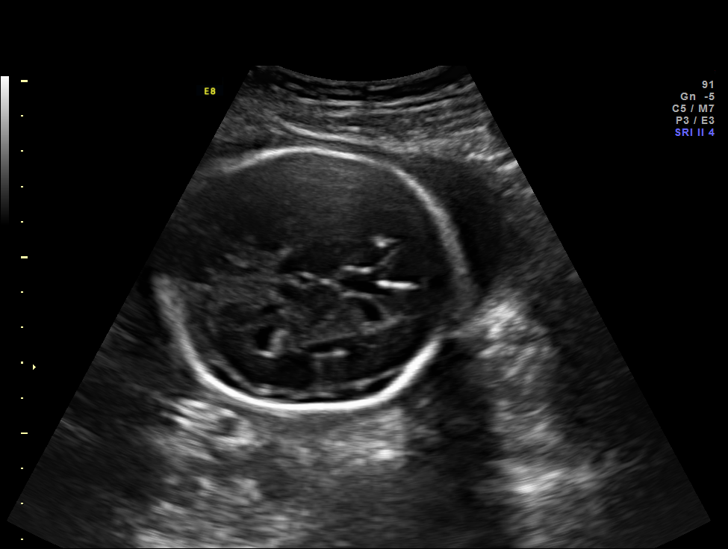

[12 of 28 positions shown; findings below may reference images not displayed]

OBSTETRICS REPORT
                      (Signed Final 10/25/2013 [DATE])

Service(s) Provided

 US OB FOLLOW UP                                       76816.1
Indications

 Follow-up incomplete fetal anatomic evaluation        Z36
 27 weeks gestation of pregnancy
 Obesity complicating pregnancy
 Uterine fibroids
Fetal Evaluation

 Num Of Fetuses:    1
 Fetal Heart Rate:  148                          bpm
 Cardiac Activity:  Observed
 Presentation:      Cephalic
 Placenta:          Anterior, above cervical os
 P. Cord            Previously Visualized
 Insertion:

 Amniotic Fluid
 AFI FV:      Subjectively within normal limits
                                             Larg Pckt:     6.6  cm
Biometry

 BPD:       74  mm     G. Age:  29w 5d                CI:         78.1   70 - 86
 OFD:     94.8  mm                                    FL/HC:      19.5   18.6 -

 HC:     269.1  mm     G. Age:  29w 2d       90  %    HC/AC:      1.15   1.05 -

 AC:     233.3  mm     G. Age:  27w 4d       62  %    FL/BPD:     70.8   71 - 87
 FL:      52.4  mm     G. Age:  27w 6d       63  %    FL/AC:      22.5   20 - 24
 HUM:     49.8  mm     G. Age:  29w 2d       93  %

 Est. FW:    9990  gm      2 lb 9 oz     75  %
Gestational Age

 LMP:           27w 0d        Date:  04/19/13                 EDD:   01/24/14
 U/S Today:     28w 4d                                        EDD:   01/13/14
 Best:          27w 0d     Det. By:  LMP  (04/19/13)          EDD:   01/24/14
Anatomy
 Cranium:          Appears normal         Aortic Arch:      Previously seen
 Fetal Cavum:      Appears normal         Ductal Arch:      Not well visualized
 Ventricles:       Bilat.ventriculomeg    Diaphragm:        Appears normal
                   [place],Rt=13Lt=10m
 Choroid Plexus:   Previously seen        Stomach:          Appears normal, left
                                                            sided
 Cerebellum:       Previously seen        Abdomen:          Appears normal
 Posterior Fossa:  Previously seen        Abdominal Wall:   Previously seen
 Nuchal Fold:      Previously seen        Cord Vessels:     Previously seen
 Face:             Appears normal         Kidneys:          Appear normal
                   (orbits and profile)
 Lips:             Appears normal         Bladder:          Appears normal
 Heart:            Appears normal         Spine:            Previously seen
                   (4CH, axis, and
                   situs)
 RVOT:             Not well visualized    Lower             Previously seen
                                          Extremities:
 LVOT:             Appears normal         Upper             Previously seen
                                          Extremities:

 Other:  Male gender. Heels and 5th digit previously seen.
Cervix Uterus Adnexa

 Cervical Length:    4.7      cm

 Cervix:       Normal appearance by transabdominal scan. Appears
               closed, without funnelling.
Impression

 Single IUP at 27w 0d
 Mild, bilateral cerebral ventriculomegaly is noted.
 The left lateral ventricle measures approximatley 11 mm; the
 right (anterior ventricle) is more difficlut to measure, but also
 appears mildly dilated
 The remainder of the cranial anatomy appears normal
 Somewhat limited views of the fetal heart again noted (RVOT,
 ductal arch)
 The estimated fetal weight is at the 75th %tile.
 Normal amniotic fluid volume

 Findings and limitations of the study were discussed with the
 patient.  After counseling, she elected to undergo NIPS and
 viral serologies (CMV, Toxo, Parvo).
Recommendations

 Recommend follow-up ultrasound examination in 4 weeks to
 reevaluate cranial anatomy and growth.

 Thank you for sharing in the care of Ms. STEVO ALBAN with
 questions or concerns.

## 2016-05-06 LAB — URINE CULTURE

## 2016-05-11 ENCOUNTER — Telehealth: Payer: Self-pay | Admitting: Nurse Practitioner

## 2016-05-11 DIAGNOSIS — Z30011 Encounter for initial prescription of contraceptive pills: Secondary | ICD-10-CM

## 2016-05-11 MED ORDER — LEVONORGEST-ETH ESTRAD 91-DAY 0.15-0.03 &0.01 MG PO TABS: 1.0000 | ORAL_TABLET | Freq: Every day | ORAL | 11 refills | Status: DC

## 2016-05-11 NOTE — Telephone Encounter (Signed)
-----   Message from Shawnie Pons, LPN sent at 03/09/3566  4:02 PM EDT ----- Pt verbalize understand of test result.   Pt would like to know or more information of what kind of food to help lower her blood sugar down?  Pt also stated her insurance will pay for [facility] for birth control--[facility] on [address]

## 2016-05-11 NOTE — Telephone Encounter (Signed)
rx sent, pt aware 

## 2016-05-25 ENCOUNTER — Encounter: Payer: Self-pay | Admitting: Nurse Practitioner

## 2016-05-25 NOTE — Telephone Encounter (Signed)
Left vm for pt to call back, concern about mychart massage she sent [place].

## 2016-06-03 ENCOUNTER — Encounter (HOSPITAL_COMMUNITY): Payer: Self-pay | Admitting: Emergency Medicine

## 2016-06-03 ENCOUNTER — Emergency Department (HOSPITAL_COMMUNITY): Payer: Commercial Managed Care - HMO

## 2016-06-03 ENCOUNTER — Emergency Department (HOSPITAL_COMMUNITY)
Admission: EM | Admit: 2016-06-03 | Discharge: 2016-06-03 | Disposition: A | Discharge status: Home / Self Care | Payer: Commercial Managed Care - HMO | Attending: Emergency Medicine | Admitting: Emergency Medicine

## 2016-06-03 DIAGNOSIS — Z79899 Other long term (current) drug therapy: Secondary | ICD-10-CM | POA: Diagnosis not present

## 2016-06-03 DIAGNOSIS — I10 Essential (primary) hypertension: Secondary | ICD-10-CM | POA: Insufficient documentation

## 2016-06-03 DIAGNOSIS — F17210 Nicotine dependence, cigarettes, uncomplicated: Secondary | ICD-10-CM | POA: Insufficient documentation

## 2016-06-03 DIAGNOSIS — K219 Gastro-esophageal reflux disease without esophagitis: Secondary | ICD-10-CM | POA: Insufficient documentation

## 2016-06-03 DIAGNOSIS — R079 Chest pain, unspecified: Secondary | ICD-10-CM | POA: Diagnosis present

## 2016-06-03 LAB — COMPREHENSIVE METABOLIC PANEL
ALT: 13 U/L — AB (ref 14–54)
AST: 20 U/L (ref 15–41)
Albumin: 4 g/dL (ref 3.5–5.0)
Alkaline Phosphatase: 57 U/L (ref 38–126)
Anion gap: 7 (ref 5–15)
BUN: 8 mg/dL (ref 6–20)
CHLORIDE: 105 mmol/L (ref 101–111)
CO2: 24 mmol/L (ref 22–32)
CREATININE: 0.67 mg/dL (ref 0.44–1.00)
Calcium: 9 mg/dL (ref 8.9–10.3)
GFR calc Af Amer: >60 mL/min (ref >60)
GFR calc non Af Amer: >60 mL/min (ref >60)
GLUCOSE: 107 mg/dL — AB (ref 65–99)
Potassium: 3.2 mmol/L — ABNORMAL LOW (ref 3.5–5.1)
SODIUM: 136 mmol/L (ref 135–145)
Total Bilirubin: 0.5 mg/dL (ref 0.3–1.2)
Total Protein: 6.8 g/dL (ref 6.5–8.1)

## 2016-06-03 LAB — CBC
HEMATOCRIT: 33 % — AB (ref 36.0–46.0)
HEMOGLOBIN: 10.8 g/dL — AB (ref 12.0–15.0)
MCH: 25.1 pg — ABNORMAL LOW (ref 26.0–34.0)
MCHC: 32.7 g/dL (ref 30.0–36.0)
MCV: 76.6 fL — ABNORMAL LOW (ref 78.0–100.0)
Platelets: 280 10*3/uL (ref 150–400)
RBC: 4.31 MIL/uL (ref 3.87–5.11)
RDW: 13.3 % (ref 11.5–15.5)
WBC: 7.9 10*3/uL (ref 4.0–10.5)

## 2016-06-03 LAB — I-STAT TROPONIN, ED: Troponin i, poc: 0 ng/mL (ref 0.00–0.08)

## 2016-06-03 LAB — LIPASE, BLOOD: Lipase: 31 U/L (ref 11–51)

## 2016-06-03 MED ORDER — HYDROCODONE-ACETAMINOPHEN 5-325 MG PO TABS
1.0000 | ORAL_TABLET | Freq: Once | ORAL | Status: AC
  Administered 2016-06-03: 1 via ORAL
  Filled 2016-06-03: qty 1

## 2016-06-03 MED ORDER — GI COCKTAIL ~~LOC~~
30.0000 mL | Freq: Once | ORAL | Status: AC
  Administered 2016-06-03: 30 mL via ORAL
  Filled 2016-06-03: qty 30

## 2016-06-03 MED ORDER — PANTOPRAZOLE SODIUM 20 MG PO TBEC: 20.0000 mg | DELAYED_RELEASE_TABLET | Freq: Every day | ORAL | 0 refills | Status: DC

## 2016-06-03 MED ORDER — ONDANSETRON HCL 4 MG/2ML IJ SOLN: 4.0000 mg | Freq: Once | INTRAMUSCULAR | Status: DC

## 2016-06-03 MED ORDER — ONDANSETRON 4 MG PO TBDP
4.0000 mg | ORAL_TABLET | Freq: Once | ORAL | Status: AC
  Administered 2016-06-03: 4 mg via ORAL
  Filled 2016-06-03: qty 1

## 2016-06-03 MED ORDER — PANTOPRAZOLE SODIUM 40 MG PO TBEC
40.0000 mg | DELAYED_RELEASE_TABLET | Freq: Once | ORAL | Status: AC
  Administered 2016-06-03: 40 mg via ORAL
  Filled 2016-06-03: qty 1

## 2016-06-03 NOTE — ED Notes (Signed)
Pt stable, ambulatory, states understanding of discharge instructions 

## 2016-06-03 NOTE — ED Triage Notes (Signed)
Arrived via EMS. Onset today developed chest pressure resolved however at time felt LLQ and RLQ abdominal pain.  While at work today pressure intermittent. Currently chest pressure 4/10 airway intact bilateral equal chest rise and fall.

## 2016-06-03 NOTE — ED Provider Notes (Signed)
[facility] DEPT Provider Note   CSN: [id number] Arrival date & time: 06/03/16  2028     History   Chief Complaint Chief Complaint  Patient presents with  . Chest Pain    HPI Jersee Winiarski is a 31 y.o. female.  The history is provided by the patient.  Chest Pain   This is a new problem. The current episode started yesterday. The problem occurs daily. The problem has been gradually worsening. The pain is associated with eating. The pain is present in the epigastric region. The pain is at a severity of 6/10. The pain is moderate. The quality of the pain is described as burning. Duration of episode(s) is 2 days. Associated symptoms include abdominal pain and nausea. Pertinent negatives include no back pain, no claudication, no cough, no diaphoresis, no dizziness, no exertional chest pressure, no fever, no headaches, no hemoptysis, no irregular heartbeat, no leg pain, no lower extremity edema, no malaise/fatigue, no near-syncope, no numbness, no orthopnea, no palpitations, no PND, no shortness of breath, no sputum production, no syncope, no vomiting and no weakness. She has tried nothing for the symptoms. The treatment provided no relief. Risk factors include obesity.  Pertinent negatives for past medical history include no diabetes, no DVT, no hyperlipidemia, no hypertension and no seizures.    Past Medical History:  Diagnosis Date  . Fibroid   . Fibroid uterus 07/29/2013   Left subserosal 3.5 x 3.1 x 2.5 Left Intramural 1.2 x 1.3 x 1.4    . HPV (human papilloma virus) infection   . MDD (major depressive disorder)   . Migraines   . Pregnancy induced hypertension   . PTSD (post-traumatic stress disorder)     Patient Active Problem List   Diagnosis Date Noted  . Postpartum care and examination 01/22/2014  . Contraception management 01/22/2014  . Preterm delivery 01/22/2014  . Hypertension 01/22/2014  . Pregnancy complicated by fetal cerebral ventriculomegaly 10/27/2013  . Fibroid  uterus 07/29/2013  . MDD (major depressive disorder) 08/12/2012  . PTSD (post-traumatic stress disorder) 08/12/2012    Past Surgical History:  Procedure Laterality Date  . CESAREAN SECTION N/A 12/18/2013   Procedure: CESAREAN SECTION;  Surgeon: Truett Mainland, DO;  Location: [facility];  Service: Obstetrics;  Laterality: N/A;  . WISDOM TOOTH EXTRACTION      OB History    Gravida Para Term Preterm AB Living   1 1   1   1    SAB TAB Ectopic Multiple Live Births         0 1       Home Medications    Prior to Admission medications   Medication Sig Start Date End Date Taking? Authorizing Provider  clotrimazole-betamethasone (LOTRISONE) lotion Apply topically 2 (two) times daily. 05/04/16   Nche, Charlene Brooke, NP  fluconazole (DIFLUCAN) 150 MG tablet Take 1 tablet (150 mg total) by mouth daily. Take second tab 3days apart from first tab 05/04/16   Nche, Charlene Brooke, NP  hydrochlorothiazide (HYDRODIURIL) 25 MG tablet Take 1 tablet (25 mg total) by mouth daily. 04/23/16   Dorie Rank, MD  Levonorgestrel-Ethinyl Estradiol (SEASONIQUE) 0.15-0.03 &0.01 MG tablet Take 1 tablet by mouth daily. 05/11/16   Nche, Charlene Brooke, NP  naproxen (NAPROSYN) 500 MG tablet Take 1 tablet (500 mg total) by mouth 2 (two) times daily as needed (for pain, take with food). 05/04/16   Nche, Charlene Brooke, NP  pantoprazole (PROTONIX) 20 MG tablet Take 1 tablet (20 mg total) by mouth daily. 06/03/16  07/03/16  Lennice Sites, DO    Family History Family History  Problem Relation Age of Onset  . Hypertension Father   . Cancer Paternal Grandmother        breast cancer  . Hypertension Paternal Grandmother     Social History Social History  Substance Use Topics  . Smoking status: Current Every Day Smoker    Types: Cigarettes  . Smokeless tobacco: Never Used  . Alcohol use No     Allergies   Patient has no known allergies.   Review of Systems Review of Systems  Constitutional: Negative for chills,  diaphoresis, fever and malaise/fatigue.  HENT: Negative for ear pain and sore throat.   Eyes: Negative for pain and visual disturbance.  Respiratory: Negative for cough, hemoptysis, sputum production and shortness of breath.   Cardiovascular: Positive for chest pain. Negative for palpitations, orthopnea, claudication, leg swelling, syncope, PND and near-syncope.  Gastrointestinal: Positive for abdominal pain and nausea. Negative for abdominal distention, constipation, diarrhea and vomiting.  Genitourinary: Negative for dysuria and hematuria.  Musculoskeletal: Negative for arthralgias and back pain.  Skin: Negative for color change and rash.  Neurological: Negative for dizziness, seizures, syncope, weakness, numbness and headaches.  All other systems reviewed and are negative.    Physical Exam Updated Vital Signs  ED Triage Vitals  Enc Vitals Group     BP 06/03/16 2031 (!) 154/98     Pulse Rate 06/03/16 2032 60     Resp 06/03/16 2031 18     Temp --      Temp src --      SpO2 06/03/16 2032 100 %     Weight 06/03/16 2034 175 lb (79.4 kg)     Height 06/03/16 2034 5\' 1"  (1.549 m)     Head Circumference --      Peak Flow --      Pain Score 06/03/16 2033 4     Pain Loc --      Pain Edu? --      Excl. in [place]? --     Physical Exam  Constitutional: She is oriented to person, place, and time. She appears well-developed and well-nourished. No distress.  HENT:  Head: Normocephalic and atraumatic.  Eyes: Conjunctivae are normal. Pupils are equal, round, and reactive to light.  Neck: Normal range of motion. Neck supple.  Cardiovascular: Normal rate, regular rhythm, normal heart sounds and intact distal pulses.   No murmur heard. Pulmonary/Chest: Effort normal and breath sounds normal. No respiratory distress. She has no wheezes.  Abdominal: Soft. She exhibits distension (TTP in epigastric region). There is no tenderness.  Musculoskeletal: She exhibits no edema.  Neurological: She is  alert and oriented to person, place, and time.  Skin: Skin is warm and dry. Capillary refill takes less than 2 seconds.  Psychiatric: She has a normal mood and affect.  Nursing note and vitals reviewed.    ED Treatments / Results  Labs (all labs ordered are listed, but only abnormal results are displayed) Labs Reviewed  CBC - Abnormal; Notable for the following:       Result Value   Hemoglobin 10.8 (*)    HCT 33.0 (*)    MCV 76.6 (*)    MCH 25.1 (*)    All other components within normal limits  COMPREHENSIVE METABOLIC PANEL - Abnormal; Notable for the following:    Potassium 3.2 (*)    Glucose, Bld 107 (*)    ALT 13 (*)    All other  components within normal limits  LIPASE, BLOOD  I-STAT TROPOININ, ED    EKG  EKG Interpretation  Date/Time:  Wednesday Jun 03 2016 20:32:11 EDT Ventricular Rate:  61 PR Interval:    QRS Duration: 103 QT Interval:  407 QTC Calculation: 410 R Axis:   23 Text Interpretation:  Sinus rhythm No significant change v comparison Confirmed by Tanna Furry [phone]) on 06/03/2016 8:56:35 PM       Radiology Dg Chest 2 View  Result Date: 06/03/2016 CLINICAL DATA:  31 year old female with chest pain. EXAM: CHEST  2 VIEW COMPARISON:  Chest radiograph dated 02/18/2016 FINDINGS: The heart size and mediastinal contours are within normal limits. Both lungs are clear. The visualized skeletal structures are unremarkable. IMPRESSION: No active cardiopulmonary disease. Electronically Signed   By: Anner Crete M.D.   On: 06/03/2016 21:08    Procedures Procedures (including critical care time)  Medications Ordered in ED Medications  HYDROcodone-acetaminophen (NORCO/VICODIN) 5-325 MG per tablet 1 tablet (not administered)  pantoprazole (PROTONIX) EC tablet 40 mg (not administered)  gi cocktail (Maalox,Lidocaine,Donnatal) (30 mLs Oral Given 06/03/16 2111)  ondansetron (ZOFRAN-ODT) disintegrating tablet 4 mg (4 mg Oral Given 06/03/16 2109)     Initial  Impression / Assessment and Plan / ED Course  I have reviewed the triage vital signs and the nursing notes.  Pertinent labs & imaging results that were available during my care of the patient were reviewed by me and considered in my medical decision making (see chart for details).     Elim Peale is a 31 year old female with history of hypertension who presents to the ED with abdominal pain. Patient's vitals at time of arrival to the ED are unremarkable and patient is without fever. Patient states that she has had epigastric abdominal pain for the last 2 days that is worse with eating food. She states that pain radiates up to her chest but describes it as a burning sensation. Patient states that she does not use alcohol but intermittently uses NSAIDs. Patient recently was on steroids for rash. Patient denies any history of reflux. Patient with no dark stools or bloody stools. Abdomen is soft but tender in the epigastric region on examination. Patient has clear breath sounds bilaterally and overall exam is unremarkable. Patient is low risk for ACS and has pt with HEAR score of 1. Will get EKG and troponin. Patient is PERC negative and doubt PE, denies use of birth control pills. Patient with history and physical consistent with reflux. Will get CBC, CMP, lipase. Will give patient Zofran, GI cocktail.  Patient with EKG that shows normal sinus rhythm with no signs of ischemia and troponin within normal limits and doubt ACS. Patient with no electrolyte abnormalities or significant anemia. Patient with normal lipase and doubt pancreatitis. Patient with no signs of hepatobiliary process. Patient felt improved following GI cocktail. Patient given additional pain medication with Norco and given first dose of protonix. Patient discharged home in good condition with prescription for protonix and recommend follow-up with primary care provider and GI as patient may need upper endoscopy. Told patient to return to the ED  if symptoms worsen.   Final Clinical Impressions(s) / ED Diagnoses   Final diagnoses:  Gastroesophageal reflux disease without esophagitis    New Prescriptions New Prescriptions   PANTOPRAZOLE (PROTONIX) 20 MG TABLET    Take 1 tablet (20 mg total) by mouth daily.     Lennice Sites, DO 06/03/16 2227    Tanna Furry, MD 06/03/16 2230

## 2016-06-03 NOTE — ED Notes (Signed)
Patient transported to X-ray 

## 2016-07-15 ENCOUNTER — Ambulatory Visit (INDEPENDENT_AMBULATORY_CARE_PROVIDER_SITE_OTHER): Payer: 59 | Admitting: Nurse Practitioner

## 2016-07-15 ENCOUNTER — Encounter: Payer: Self-pay | Admitting: Nurse Practitioner

## 2016-07-15 VITALS — BP 136/100 | HR 59 | Temp 97.9°F | Ht 61.0 in | Wt 179.0 lb

## 2016-07-15 DIAGNOSIS — K219 Gastro-esophageal reflux disease without esophagitis: Secondary | ICD-10-CM

## 2016-07-15 DIAGNOSIS — I10 Essential (primary) hypertension: Secondary | ICD-10-CM | POA: Diagnosis not present

## 2016-07-15 DIAGNOSIS — G43009 Migraine without aura, not intractable, without status migrainosus: Secondary | ICD-10-CM

## 2016-07-15 DIAGNOSIS — D509 Iron deficiency anemia, unspecified: Secondary | ICD-10-CM

## 2016-07-15 MED ORDER — HYDROCHLOROTHIAZIDE 25 MG PO TABS: 25.0000 mg | ORAL_TABLET | Freq: Every day | ORAL | 1 refills | Status: DC

## 2016-07-15 MED ORDER — PANTOPRAZOLE SODIUM 40 MG PO TBEC: 40.0000 mg | DELAYED_RELEASE_TABLET | Freq: Every day | ORAL | 3 refills | Status: DC

## 2016-07-15 MED ORDER — FERROUS SULFATE 325 (65 FE) MG PO TBEC: 325.0000 mg | DELAYED_RELEASE_TABLET | Freq: Three times a day (TID) | ORAL | 3 refills | Status: DC

## 2016-07-15 MED ORDER — RANITIDINE HCL 300 MG PO TABS: 300.0000 mg | ORAL_TABLET | Freq: Every day | ORAL | 3 refills | Status: DC

## 2016-07-15 MED ORDER — POTASSIUM CHLORIDE CRYS ER 20 MEQ PO TBCR
20.0000 meq | EXTENDED_RELEASE_TABLET | Freq: Every day | ORAL | 3 refills | Status: DC
Start: 2016-07-15 — End: 2016-08-17

## 2016-07-15 NOTE — Patient Instructions (Addendum)
Let me know which contraceptive is covered by new insurance  Food Choices for Gastroesophageal Reflux Disease, Adult When you have gastroesophageal reflux disease (GERD), the foods you eat and your eating habits are very important. Choosing the right foods can help ease the discomfort of GERD. Consider working with a diet and nutrition specialist (dietitian) to help you make healthy food choices. What general guidelines should I follow? Eating plan  Choose healthy foods low in fat, such as fruits, vegetables, whole grains, low-fat dairy products, and lean meat, fish, and poultry.  Eat frequent, small meals instead of three large meals each day. Eat your meals slowly, in a relaxed setting. Avoid bending over or lying down until 2-3 hours after eating.  Limit high-fat foods such as fatty meats or fried foods.  Limit your intake of oils, butter, and shortening to less than 8 teaspoons each day.  Avoid the following: ? Foods that cause symptoms. These may be different for different people. Keep a food diary to keep track of foods that cause symptoms. ? Alcohol. ? Drinking large amounts of liquid with meals. ? Eating meals during the 2-3 hours before bed.  Cook foods using methods other than frying. This may include baking, grilling, or broiling. Lifestyle   Maintain a healthy weight. Ask your health care provider what weight is healthy for you. If you need to lose weight, work with your health care provider to do so safely.  Exercise for at least 30 minutes on 5 or more days each week, or as told by your health care provider.  Avoid wearing clothes that fit tightly around your waist and chest.  Do not use any products that contain nicotine or tobacco, such as cigarettes and e-cigarettes. If you need help quitting, ask your health care provider.  Sleep with the head of your bed raised. Use a wedge under the mattress or blocks under the bed frame to raise the head of the bed. What foods  are not recommended? The items listed may not be a complete list. Talk with your dietitian about what dietary choices are best for you. Grains Pastries or quick breads with added fat. [place] toast. Vegetables Deep fried vegetables. [place] fries. Any vegetables prepared with added fat. Any vegetables that cause symptoms. For some people this may include tomatoes and tomato products, chili peppers, onions and garlic, and horseradish. Fruits Any fruits prepared with added fat. Any fruits that cause symptoms. For some people this may include citrus fruits, such as oranges, grapefruit, pineapple, and lemons. Meats and other protein foods High-fat meats, such as fatty beef or pork, hot dogs, ribs, ham, sausage, salami and bacon. Fried meat or protein, including fried fish and fried chicken. Nuts and nut butters. Dairy Whole milk and chocolate milk. Sour cream. Cream. Ice cream. Cream cheese. Milk shakes. Beverages Coffee and tea, with or without caffeine. Carbonated beverages. Sodas. Energy drinks. Fruit juice made with acidic fruits (such as orange or grapefruit). Tomato juice. Alcoholic drinks. Fats and oils Butter. Margarine. Shortening. Ghee. Sweets and desserts Chocolate and cocoa. Donuts. Seasoning and other foods Pepper. Peppermint and spearmint. Any condiments, herbs, or seasonings that cause symptoms. For some people, this may include curry, hot sauce, or vinegar-based salad dressings. Summary  When you have gastroesophageal reflux disease (GERD), food and lifestyle choices are very important to help ease the discomfort of GERD.  Eat frequent, small meals instead of three large meals each day. Eat your meals slowly, in a relaxed setting. Avoid bending over  or lying down until 2-3 hours after eating.  Limit high-fat foods such as fatty meat or fried foods. This information is not intended to replace advice given to you by your health care provider. Make sure you discuss any questions you  have with your health care provider. Document Released: 12/22/2004 Document Revised: 12/24/2015 Document Reviewed: 12/24/2015 Elsevier Interactive Patient Education  2017 [employer].

## 2016-07-15 NOTE — Progress Notes (Signed)
Subjective:  Patient ID: Katrina Garcia, female    DOB: 1985-02-18  Age: 31 y.o. MRN: [id number]  CC: Migraine (migrain from bright light at times/birth control options--painful on RLQ when cycle comes on/ feel full too fast-maybe from fibroid?/med refill)   HPI  GERD: Current use of protonix and nexium prn. With some relief Ongoing since 05/2016. Epigastric pain and globus sensation. Triggered by certain foods. Intermittent x 4years, getting worsew in last 44month.  HTN: Elevated BP with pregnancy. Current use of HCTZ. Does not check BP at home.  Headache: Hx of migraine Triggered by bright light and menstrual cycle. No change in characteristic.  Menorrhagia with regular cycle: Will like another OCP prescription. Did not take previous prescription due to high cost. Will need to find out what is covered by insurance. Has heavy menstrual cycle: last 6days, uses 1tampon per hour, has some clots (size of quarter) Also has Severe cramps. Hx of uterine fibroid.  Outpatient Medications Prior to Visit  Medication Sig Dispense Refill  . clotrimazole-betamethasone (LOTRISONE) lotion Apply topically 2 (two) times daily. 30 mL 0  . fluconazole (DIFLUCAN) 150 MG tablet Take 1 tablet (150 mg total) by mouth daily. Take second tab 3days apart from first tab 2 tablet 0  . Levonorgestrel-Ethinyl Estradiol (SEASONIQUE) 0.15-0.03 &0.01 MG tablet Take 1 tablet by mouth daily. 1 Package 11  . naproxen (NAPROSYN) 500 MG tablet Take 1 tablet (500 mg total) by mouth 2 (two) times daily as needed (for pain, take with food). 30 tablet 0  . hydrochlorothiazide (HYDRODIURIL) 25 MG tablet Take 1 tablet (25 mg total) by mouth daily. 30 tablet 1  . pantoprazole (PROTONIX) 20 MG tablet Take 1 tablet (20 mg total) by mouth daily. 30 tablet 0   No facility-administered medications prior to visit.     ROS Review of Systems  Constitutional: Negative for fever, malaise/fatigue and weight loss.  HENT:  Negative for congestion and sore throat.   Eyes: Positive for photophobia.       Negative for visual changes  Respiratory: Negative for cough and shortness of breath.   Cardiovascular: Negative for chest pain, palpitations and leg swelling.  Gastrointestinal: Positive for abdominal pain and heartburn. Negative for blood in stool, constipation, diarrhea, melena, nausea and vomiting.  Genitourinary: Negative for dysuria, frequency and urgency.  Musculoskeletal: Negative for falls, joint pain and myalgias.  Skin: Negative for rash.  Neurological: Positive for headaches. Negative for dizziness, sensory change and loss of consciousness.  Endo/Heme/Allergies: Does not bruise/bleed easily.  Psychiatric/Behavioral: Negative for depression, substance abuse and suicidal ideas. The patient is not nervous/anxious and does not have insomnia.     Objective:  BP (!) 136/100   Pulse (!) 59   Temp 97.9 F (36.6 C)   Ht 5\' 1"  (1.549 m)   Wt 179 lb (81.2 kg)   SpO2 98%   BMI 33.82 kg/m   BP Readings from Last 3 Encounters:  07/15/16 (!) 136/100  06/03/16 (!) 142/92  05/04/16 136/90    Wt Readings from Last 3 Encounters:  07/15/16 179 lb (81.2 kg)  06/03/16 175 lb (79.4 kg)  05/04/16 178 lb (80.7 kg)    Physical Exam  Constitutional: She is oriented to person, place, and time. No distress.  Neck: Normal range of motion. Neck supple. No thyromegaly present.  Cardiovascular: Normal rate, regular rhythm and normal heart sounds.   Pulmonary/Chest: Effort normal and breath sounds normal.  Abdominal: Soft. Bowel sounds are normal. She exhibits no distension. There  is tenderness. There is no rebound and no guarding.  Musculoskeletal: She exhibits no edema.  Lymphadenopathy:    She has no cervical adenopathy.  Neurological: She is alert and oriented to person, place, and time.  Vitals reviewed.   Lab Results  Component Value Date   WBC 7.9 06/03/2016   HGB 10.8 (L) 06/03/2016   HCT 33.0 (L)  06/03/2016   PLT 280 06/03/2016   GLUCOSE 107 (H) 06/03/2016   CHOL 125 05/04/2016   TRIG 77.0 05/04/2016   HDL 45.80 05/04/2016   LDLCALC 64 05/04/2016   ALT 13 (L) 06/03/2016   AST 20 06/03/2016   NA 136 06/03/2016   K 3.2 (L) 06/03/2016   CL 105 06/03/2016   CREATININE 0.67 06/03/2016   BUN 8 06/03/2016   CO2 24 06/03/2016   TSH 0.92 05/04/2016   HGBA1C 6.2 05/04/2016    Dg Chest 2 View  Result Date: 06/03/2016 CLINICAL DATA:  31 year old female with chest pain. EXAM: CHEST  2 VIEW COMPARISON:  Chest radiograph dated 02/18/2016 FINDINGS: The heart size and mediastinal contours are within normal limits. Both lungs are clear. The visualized skeletal structures are unremarkable. IMPRESSION: No active cardiopulmonary disease. Electronically Signed   By: Anner Crete M.D.   On: 06/03/2016 21:08    Assessment & Plan:   Katrina Garcia was seen today for migraine.  Diagnoses and all orders for this visit:  Essential hypertension -     hydrochlorothiazide (HYDRODIURIL) 25 MG tablet; Take 1 tablet (25 mg total) by mouth daily. -     potassium chloride SA (K-DUR,KLOR-CON) 20 MEQ tablet; Take 1 tablet (20 mEq total) by mouth daily.  Gastroesophageal reflux disease without esophagitis -     pantoprazole (PROTONIX) 40 MG tablet; Take 1 tablet (40 mg total) by mouth daily. -     ranitidine (ZANTAC) 300 MG tablet; Take 1 tablet (300 mg total) by mouth at bedtime.  Migraine without aura and without status migrainosus, not intractable  Iron deficiency anemia, unspecified iron deficiency anemia type -     ferrous sulfate 325 (65 FE) MG EC tablet; Take 1 tablet (325 mg total) by mouth 3 (three) times daily with meals.   I have changed Katrina Garcia's pantoprazole. I am also having her start on ferrous sulfate, potassium chloride SA, and ranitidine. Additionally, I am having her maintain her clotrimazole-betamethasone, fluconazole, naproxen, Levonorgestrel-Ethinyl Estradiol, cyclobenzaprine, and  hydrochlorothiazide.  Meds ordered this encounter  Medications  . cyclobenzaprine (FLEXERIL) 10 MG tablet  . pantoprazole (PROTONIX) 40 MG tablet    Sig: Take 1 tablet (40 mg total) by mouth daily.    Dispense:  30 tablet    Refill:  3    Order Specific Question:   Supervising Provider    Answer:   Cassandria Anger [[id number]]  . hydrochlorothiazide (HYDRODIURIL) 25 MG tablet    Sig: Take 1 tablet (25 mg total) by mouth daily.    Dispense:  30 tablet    Refill:  1    Order Specific Question:   Supervising Provider    Answer:   Cassandria Anger [[id number]]  . ferrous sulfate 325 (65 FE) MG EC tablet    Sig: Take 1 tablet (325 mg total) by mouth 3 (three) times daily with meals.    Dispense:  90 tablet    Refill:  3    Order Specific Question:   Supervising Provider    Answer:   Cassandria Anger [[id number]]  . potassium chloride SA (K-DUR,KLOR-CON)  20 MEQ tablet    Sig: Take 1 tablet (20 mEq total) by mouth daily.    Dispense:  30 tablet    Refill:  3    Order Specific Question:   Supervising Provider    Answer:   Cassandria Anger [[id number]]  . ranitidine (ZANTAC) 300 MG tablet    Sig: Take 1 tablet (300 mg total) by mouth at bedtime.    Dispense:  30 tablet    Refill:  3    Order Specific Question:   Supervising Provider    Answer:   Cassandria Anger [[id number]]    Follow-up: Return in about 4 weeks (around 08/12/2016) for HTN and GERD.  Wilfred Lacy, NP

## 2016-07-29 ENCOUNTER — Encounter: Payer: Self-pay | Admitting: Nurse Practitioner

## 2016-07-31 ENCOUNTER — Ambulatory Visit: Payer: Self-pay | Admitting: Nurse Practitioner

## 2016-08-06 ENCOUNTER — Ambulatory Visit: Payer: Self-pay | Admitting: Nurse Practitioner

## 2016-08-07 DIAGNOSIS — R110 Nausea: Secondary | ICD-10-CM | POA: Diagnosis not present

## 2016-08-07 DIAGNOSIS — I10 Essential (primary) hypertension: Secondary | ICD-10-CM | POA: Diagnosis not present

## 2016-08-07 DIAGNOSIS — T39315A Adverse effect of propionic acid derivatives, initial encounter: Secondary | ICD-10-CM | POA: Diagnosis not present

## 2016-08-07 DIAGNOSIS — G44201 Tension-type headache, unspecified, intractable: Secondary | ICD-10-CM | POA: Diagnosis not present

## 2016-08-07 DIAGNOSIS — Z79899 Other long term (current) drug therapy: Secondary | ICD-10-CM | POA: Diagnosis not present

## 2016-08-07 DIAGNOSIS — R1013 Epigastric pain: Secondary | ICD-10-CM | POA: Diagnosis not present

## 2016-08-07 DIAGNOSIS — G44209 Tension-type headache, unspecified, not intractable: Secondary | ICD-10-CM | POA: Diagnosis not present

## 2016-08-07 DIAGNOSIS — K219 Gastro-esophageal reflux disease without esophagitis: Secondary | ICD-10-CM | POA: Diagnosis not present

## 2016-08-12 ENCOUNTER — Ambulatory Visit (INDEPENDENT_AMBULATORY_CARE_PROVIDER_SITE_OTHER): Payer: 59 | Admitting: Nurse Practitioner

## 2016-08-12 VITALS — BP 140/98 | HR 81 | Temp 98.4°F | Ht 61.0 in | Wt 184.0 lb

## 2016-08-12 DIAGNOSIS — M5442 Lumbago with sciatica, left side: Secondary | ICD-10-CM | POA: Diagnosis not present

## 2016-08-12 DIAGNOSIS — L304 Erythema intertrigo: Secondary | ICD-10-CM | POA: Diagnosis not present

## 2016-08-12 DIAGNOSIS — I10 Essential (primary) hypertension: Secondary | ICD-10-CM

## 2016-08-12 DIAGNOSIS — K219 Gastro-esophageal reflux disease without esophagitis: Secondary | ICD-10-CM | POA: Diagnosis not present

## 2016-08-12 DIAGNOSIS — Z7251 High risk heterosexual behavior: Secondary | ICD-10-CM

## 2016-08-12 LAB — POCT URINE PREGNANCY: Preg Test, Ur: NEGATIVE

## 2016-08-13 ENCOUNTER — Telehealth: Payer: Self-pay

## 2016-08-13 ENCOUNTER — Ambulatory Visit (INDEPENDENT_AMBULATORY_CARE_PROVIDER_SITE_OTHER)
Admission: RE | Admit: 2016-08-13 | Discharge: 2016-08-13 | Disposition: A | Discharge status: Home / Self Care | Payer: 59 | Source: Ambulatory Visit | Attending: Nurse Practitioner | Admitting: Nurse Practitioner

## 2016-08-13 ENCOUNTER — Other Ambulatory Visit: Payer: 59

## 2016-08-13 DIAGNOSIS — M5442 Lumbago with sciatica, left side: Secondary | ICD-10-CM

## 2016-08-13 DIAGNOSIS — M545 Low back pain: Secondary | ICD-10-CM | POA: Diagnosis not present

## 2016-08-13 NOTE — Telephone Encounter (Signed)
error 

## 2016-08-17 ENCOUNTER — Encounter: Payer: Self-pay | Admitting: Nurse Practitioner

## 2016-08-17 MED ORDER — HYDROCHLOROTHIAZIDE 25 MG PO TABS: 25.0000 mg | ORAL_TABLET | Freq: Every day | ORAL | 1 refills | Status: DC

## 2016-08-17 MED ORDER — POTASSIUM CHLORIDE CRYS ER 20 MEQ PO TBCR: 20.0000 meq | EXTENDED_RELEASE_TABLET | Freq: Every day | ORAL | 1 refills | Status: DC

## 2016-08-17 MED ORDER — RANITIDINE HCL 300 MG PO TABS: 300.0000 mg | ORAL_TABLET | Freq: Every day | ORAL | 1 refills | Status: DC

## 2016-08-17 MED ORDER — PANTOPRAZOLE SODIUM 40 MG PO TBEC: 40.0000 mg | DELAYED_RELEASE_TABLET | Freq: Every day | ORAL | 1 refills | Status: DC

## 2016-08-17 NOTE — Patient Instructions (Addendum)
Continue pantoprazole and ranitine for GERD.  Back pain: Normal lumbar spine x-ray. Use back pain hip stretch exercise, tylenol and muscle relaxant prn for back pain.  HTN: continue HCTZ and KDur.

## 2016-08-17 NOTE — Progress Notes (Signed)
Subjective:  Patient ID: Katrina Garcia, female    DOB: [birth year]  Age: 31 y.o. MRN: [id number]  CC: Follow-up (4 wk fu/left leg still has pain--numbness,throbbing pain/SOB/Meds refill-:pantoprazole?lotrisone?--headache and chest discomfort on righ side chest)   Back Pain  This is a recurrent problem. The current episode started 1 to 4 weeks ago. The problem occurs intermittently. The problem has been waxing and waning since onset. The pain is present in the lumbar spine. The quality of the pain is described as cramping and shooting. The pain radiates to the left thigh and left knee. The symptoms are aggravated by sitting and standing. Associated symptoms include leg pain, numbness, paresthesias and tingling. Pertinent negatives include no bladder incontinence, bowel incontinence, dysuria, paresis, pelvic pain, perianal numbness, weakness or weight loss. Risk factors include lack of exercise, poor posture and sedentary lifestyle. She has tried walking for the symptoms. The treatment provided mild relief.   She is not taking any birth control at this time due to high deductible.  HTN: stable with HCTZ and potassium. BP Readings from Last 3 Encounters:  08/12/16 (!) 140/98  07/15/16 (!) 136/100  06/03/16 (!) 142/92   GERD: Stable with diet and pantoprazole.  Outpatient Medications Prior to Visit  Medication Sig Dispense Refill  . clotrimazole-betamethasone (LOTRISONE) lotion Apply topically 2 (two) times daily. 30 mL 0  . cyclobenzaprine (FLEXERIL) 10 MG tablet     . fluconazole (DIFLUCAN) 150 MG tablet Take 1 tablet (150 mg total) by mouth daily. Take second tab 3days apart from first tab 2 tablet 0  . hydrochlorothiazide (HYDRODIURIL) 25 MG tablet Take 1 tablet (25 mg total) by mouth daily. 30 tablet 1  . Levonorgestrel-Ethinyl Estradiol (SEASONIQUE) 0.15-0.03 &0.01 MG tablet Take 1 tablet by mouth daily. 1 Package 11  . naproxen (NAPROSYN) 500 MG tablet Take 1 tablet (500 mg total) by  mouth 2 (two) times daily as needed (for pain, take with food). 30 tablet 0  . pantoprazole (PROTONIX) 40 MG tablet Take 1 tablet (40 mg total) by mouth daily. 30 tablet 3  . potassium chloride SA (K-DUR,KLOR-CON) 20 MEQ tablet Take 1 tablet (20 mEq total) by mouth daily. 30 tablet 3  . ranitidine (ZANTAC) 300 MG tablet Take 1 tablet (300 mg total) by mouth at bedtime. 30 tablet 3  . ferrous sulfate 325 (65 FE) MG EC tablet Take 1 tablet (325 mg total) by mouth 3 (three) times daily with meals. (Patient not taking: Reported on 08/12/2016) 90 tablet 3   No facility-administered medications prior to visit.     ROS See HPI  Objective:  BP (!) 140/98   Pulse 81   Temp 98.4 F (36.9 C)   Ht 5\' 1"  (1.549 m)   Wt 184 lb (83.5 kg)   LMP 07/30/2016 (Exact Date)   SpO2 98%   BMI 34.77 kg/m   BP Readings from Last 3 Encounters:  08/12/16 (!) 140/98  07/15/16 (!) 136/100  06/03/16 (!) 142/92    Wt Readings from Last 3 Encounters:  08/12/16 184 lb (83.5 kg)  07/15/16 179 lb (81.2 kg)  06/03/16 175 lb (79.4 kg)    Physical Exam  Constitutional: She is oriented to person, place, and time. No distress.  Cardiovascular: Normal rate, regular rhythm and normal heart sounds.   Pulmonary/Chest: Effort normal.  Abdominal: Soft. Bowel sounds are normal.  Musculoskeletal: Normal range of motion. She exhibits no edema or tenderness.  Neurological: She is alert and oriented to person, place, and time.  Skin: Skin is warm and dry.  Vitals reviewed.   Lab Results  Component Value Date   WBC 7.9 06/03/2016   HGB 10.8 (L) 06/03/2016   HCT 33.0 (L) 06/03/2016   PLT 280 06/03/2016   GLUCOSE 107 (H) 06/03/2016   CHOL 125 05/04/2016   TRIG 77.0 05/04/2016   HDL 45.80 05/04/2016   LDLCALC 64 05/04/2016   ALT 13 (L) 06/03/2016   AST 20 06/03/2016   NA 136 06/03/2016   K 3.2 (L) 06/03/2016   CL 105 06/03/2016   CREATININE 0.67 06/03/2016   BUN 8 06/03/2016   CO2 24 06/03/2016   TSH 0.92  05/04/2016   HGBA1C 6.2 05/04/2016    Dg Lumbar Spine Complete  Result Date: 08/13/2016 CLINICAL DATA:  Left-sided low back pain and left leg pain for 3 months, worse recently, no injury EXAM: LUMBAR SPINE - COMPLETE 4+ VIEW COMPARISON:  CT abdomen pelvis of 06/18/2010 FINDINGS: The lumbar vertebrae are in normal alignment with normal intervertebral disc spaces. No compression deformity is seen. The SI joints are corticated. The facet joints are unremarkable. IMPRESSION: Normal alignment of the lumbar spine. Normal intervertebral disc spaces. Electronically Signed   By: Ivar Drape M.D.   On: 08/13/2016 11:00    Assessment & Plan:   Katrina Garcia was seen today for follow-up.  Diagnoses and all orders for this visit:  Essential hypertension -     hydrochlorothiazide (HYDRODIURIL) 25 MG tablet; Take 1 tablet (25 mg total) by mouth daily. -     potassium chloride SA (K-DUR,KLOR-CON) 20 MEQ tablet; Take 1 tablet (20 mEq total) by mouth daily.  Acute left-sided low back pain with left-sided sciatica  Unprotected sexual intercourse -     POCT urine pregnancy  Intertrigo  Gastroesophageal reflux disease without esophagitis -     pantoprazole (PROTONIX) 40 MG tablet; Take 1 tablet (40 mg total) by mouth daily. -     ranitidine (ZANTAC) 300 MG tablet; Take 1 tablet (300 mg total) by mouth at bedtime.   I have discontinued Ms. Accomando's fluconazole, naproxen, Levonorgestrel-Ethinyl Estradiol, and FERROUS SULFATE PO. I am also having her maintain her clotrimazole-betamethasone, cyclobenzaprine, ferrous sulfate, hydrochlorothiazide, pantoprazole, ranitidine, and potassium chloride SA.  Meds ordered this encounter  Medications  . DISCONTD: FERROUS SULFATE PO    Sig: Take 250 mg by mouth.  . hydrochlorothiazide (HYDRODIURIL) 25 MG tablet    Sig: Take 1 tablet (25 mg total) by mouth daily.    Dispense:  90 tablet    Refill:  1    Order Specific Question:   Supervising Provider    Answer:   Cassandria Anger [[id number]]  . pantoprazole (PROTONIX) 40 MG tablet    Sig: Take 1 tablet (40 mg total) by mouth daily.    Dispense:  90 tablet    Refill:  1    Order Specific Question:   Supervising Provider    Answer:   Cassandria Anger [[id number]]  . ranitidine (ZANTAC) 300 MG tablet    Sig: Take 1 tablet (300 mg total) by mouth at bedtime.    Dispense:  90 tablet    Refill:  1    Order Specific Question:   Supervising Provider    Answer:   Cassandria Anger [[id number]]  . potassium chloride SA (K-DUR,KLOR-CON) 20 MEQ tablet    Sig: Take 1 tablet (20 mEq total) by mouth daily.    Dispense:  90 tablet    Refill:  1  Order Specific Question:   Supervising Provider    Answer:   Cassandria Anger [[id number]]    Follow-up: Return in about 6 months (around 02/12/2017) for HTN.  Wilfred Lacy, NP

## 2016-08-26 ENCOUNTER — Encounter: Payer: Self-pay | Admitting: Nurse Practitioner

## 2016-09-09 ENCOUNTER — Encounter: Payer: Self-pay | Admitting: Nurse Practitioner

## 2016-09-09 ENCOUNTER — Ambulatory Visit: Payer: Self-pay | Admitting: Nurse Practitioner

## 2016-09-10 ENCOUNTER — Encounter: Payer: Self-pay | Admitting: Nurse Practitioner

## 2016-09-10 ENCOUNTER — Ambulatory Visit (INDEPENDENT_AMBULATORY_CARE_PROVIDER_SITE_OTHER): Payer: Federal, State, Local not specified - PPO | Admitting: Nurse Practitioner

## 2016-09-10 VITALS — BP 132/100 | HR 57 | Temp 98.0°F | Ht 61.0 in | Wt 183.0 lb

## 2016-09-10 DIAGNOSIS — N644 Mastodynia: Secondary | ICD-10-CM | POA: Diagnosis not present

## 2016-09-10 DIAGNOSIS — R0982 Postnasal drip: Secondary | ICD-10-CM | POA: Diagnosis not present

## 2016-09-10 DIAGNOSIS — J309 Allergic rhinitis, unspecified: Secondary | ICD-10-CM | POA: Diagnosis not present

## 2016-09-10 MED ORDER — FLUTICASONE PROPIONATE 50 MCG/ACT NA SUSP: 2.0000 | Freq: Every day | NASAL | 0 refills | Status: DC

## 2016-09-10 MED ORDER — CETIRIZINE HCL 10 MG PO TABS: 10.0000 mg | ORAL_TABLET | Freq: Every day | ORAL | 0 refills | Status: DC

## 2016-09-10 NOTE — Patient Instructions (Addendum)
You will be contacted to schedule breast ultrasound.

## 2016-09-10 NOTE — Progress Notes (Signed)
Subjective:  Patient ID: Katrina Garcia, female    DOB: September 04, 1985  Age: 31 y.o. MRN: [id number]  CC: Follow-up (small spot on right breast,painful,swelling behidn ears and has to clear throat offent. notice this yesterday. mammogram consult?)   HPI Acute Right Breast pain: Onset 1week ago. Denies any chest wall injury. No nipple discharge. LMP 08/31/2016, normal cycle per patient. FH of breast cancer ([facility]).  Also complains of post nasal drip with throat clearing.  Outpatient Medications Prior to Visit  Medication Sig Dispense Refill  . clotrimazole-betamethasone (LOTRISONE) lotion Apply topically 2 (two) times daily. 30 mL 0  . cyclobenzaprine (FLEXERIL) 10 MG tablet     . hydrochlorothiazide (HYDRODIURIL) 25 MG tablet Take 1 tablet (25 mg total) by mouth daily. 90 tablet 1  . pantoprazole (PROTONIX) 40 MG tablet Take 1 tablet (40 mg total) by mouth daily. 90 tablet 1  . potassium chloride SA (K-DUR,KLOR-CON) 20 MEQ tablet Take 1 tablet (20 mEq total) by mouth daily. 90 tablet 1  . ranitidine (ZANTAC) 300 MG tablet Take 1 tablet (300 mg total) by mouth at bedtime. 90 tablet 1  . ferrous sulfate 325 (65 FE) MG EC tablet Take 1 tablet (325 mg total) by mouth 3 (three) times daily with meals. (Patient not taking: Reported on 08/12/2016) 90 tablet 3   No facility-administered medications prior to visit.     ROS See HPI  Objective:  BP (!) 132/100   Pulse (!) 57   Temp 98 F (36.7 C)   Ht 5\' 1"  (1.549 m)   Wt 183 lb (83 kg)   LMP 08/31/2016 (Exact Date)   SpO2 99%   BMI 34.58 kg/m   BP Readings from Last 3 Encounters:  09/10/16 (!) 132/100  08/12/16 (!) 140/98  07/15/16 (!) 136/100    Wt Readings from Last 3 Encounters:  09/10/16 183 lb (83 kg)  08/12/16 184 lb (83.5 kg)  07/15/16 179 lb (81.2 kg)    Physical Exam  Constitutional: She is oriented to person, place, and time. No distress.  HENT:  Head: Normocephalic.  Right Ear: Tympanic membrane, external ear and  ear canal normal.  Left Ear: Tympanic membrane, external ear and ear canal normal.  Nose: Mucosal edema present. No rhinorrhea.  Mouth/Throat: Uvula is midline and oropharynx is clear and moist. No oropharyngeal exudate.  Eyes: No scleral icterus.  Cardiovascular: Normal rate.   Pulmonary/Chest: Effort normal. Right breast exhibits tenderness. Right breast exhibits no inverted nipple, no mass, no nipple discharge and no skin change. Left breast exhibits no inverted nipple, no mass, no nipple discharge, no skin change and no tenderness. Breasts are symmetrical.    Neurological: She is alert and oriented to person, place, and time.  Skin: Skin is warm and dry. No rash noted. No erythema.  Vitals reviewed.   Lab Results  Component Value Date   WBC 7.9 06/03/2016   HGB 10.8 (L) 06/03/2016   HCT 33.0 (L) 06/03/2016   PLT 280 06/03/2016   GLUCOSE 107 (H) 06/03/2016   CHOL 125 05/04/2016   TRIG 77.0 05/04/2016   HDL 45.80 05/04/2016   LDLCALC 64 05/04/2016   ALT 13 (L) 06/03/2016   AST 20 06/03/2016   NA 136 06/03/2016   K 3.2 (L) 06/03/2016   CL 105 06/03/2016   CREATININE 0.67 06/03/2016   BUN 8 06/03/2016   CO2 24 06/03/2016   TSH 0.92 05/04/2016   HGBA1C 6.2 05/04/2016    Dg Lumbar Spine Complete  Result  Date: 08/13/2016 CLINICAL DATA:  Left-sided low back pain and left leg pain for 3 months, worse recently, no injury EXAM: LUMBAR SPINE - COMPLETE 4+ VIEW COMPARISON:  CT abdomen pelvis of 06/18/2010 FINDINGS: The lumbar vertebrae are in normal alignment with normal intervertebral disc spaces. No compression deformity is seen. The SI joints are corticated. The facet joints are unremarkable. IMPRESSION: Normal alignment of the lumbar spine. Normal intervertebral disc spaces. Electronically Signed   By: Ivar Drape M.D.   On: 08/13/2016 11:00    Assessment & Plan:   Lorraina was seen today for follow-up.  Diagnoses and all orders for this visit:  Breast pain, right -     Cancel:  MM DIAG BREAST TOMO UNI RIGHT; Future -     US BREAST LTD UNI RIGHT INC AXILLA; Future  Allergic rhinitis with postnasal drip -     cetirizine (ZYRTEC) 10 MG tablet; Take 1 tablet (10 mg total) by mouth daily. -     fluticasone (FLONASE) 50 MCG/ACT nasal spray; Place 2 sprays into both nostrils daily.   I am having Katrina Garcia start on cetirizine and fluticasone. I am also having her maintain her clotrimazole-betamethasone, cyclobenzaprine, ferrous sulfate, hydrochlorothiazide, pantoprazole, ranitidine, and potassium chloride SA.  Meds ordered this encounter  Medications  . cetirizine (ZYRTEC) 10 MG tablet    Sig: Take 1 tablet (10 mg total) by mouth daily.    Dispense:  30 tablet    Refill:  0    Order Specific Question:   Supervising Provider    Answer:   Cassandria Anger [[id number]]  . fluticasone (FLONASE) 50 MCG/ACT nasal spray    Sig: Place 2 sprays into both nostrils daily.    Dispense:  16 g    Refill:  0    Order Specific Question:   Supervising Provider    Answer:   Cassandria Anger [[id number]]    Follow-up: Return if symptoms worsen or fail to improve.  Wilfred Lacy, NP

## 2016-09-30 ENCOUNTER — Encounter: Payer: Self-pay | Admitting: Nurse Practitioner

## 2016-10-07 ENCOUNTER — Ambulatory Visit: Payer: Self-pay | Admitting: Nurse Practitioner

## 2016-10-08 ENCOUNTER — Encounter: Payer: Self-pay | Admitting: Nurse Practitioner

## 2016-10-09 ENCOUNTER — Ambulatory Visit: Payer: Self-pay | Admitting: Nurse Practitioner

## 2016-10-10 ENCOUNTER — Ambulatory Visit (INDEPENDENT_AMBULATORY_CARE_PROVIDER_SITE_OTHER): Payer: Federal, State, Local not specified - PPO | Admitting: Family Medicine

## 2016-10-10 VITALS — BP 126/84 | HR 71 | Temp 98.8°F | Resp 18 | Ht 61.0 in | Wt 185.0 lb

## 2016-10-10 DIAGNOSIS — J069 Acute upper respiratory infection, unspecified: Secondary | ICD-10-CM | POA: Diagnosis not present

## 2016-10-10 MED ORDER — HYDROCOD POLST-CPM POLST ER 10-8 MG/5ML PO SUER: 5.0000 mL | Freq: Two times a day (BID) | ORAL | 0 refills | Status: DC | PRN

## 2016-10-10 NOTE — Progress Notes (Signed)
SUBJECTIVE:  Katrina Garcia is a 31 y.o. female pt seen in weekend clinic, who complains of coryza, congestion, sore throat, fever, post nasal drip and dry cough for 5 days. She denies a history of anorexia and chest pain and denies a history of asthma. Patient denies smoke cigarettes.   Current Outpatient Prescriptions on File Prior to Visit  Medication Sig Dispense Refill  . clotrimazole-betamethasone (LOTRISONE) lotion Apply topically 2 (two) times daily. 30 mL 0  . cyclobenzaprine (FLEXERIL) 10 MG tablet     . ferrous sulfate 325 (65 FE) MG EC tablet Take 1 tablet (325 mg total) by mouth 3 (three) times daily with meals. 90 tablet 3  . fluticasone (FLONASE) 50 MCG/ACT nasal spray Place 2 sprays into both nostrils daily. 16 g 0  . hydrochlorothiazide (HYDRODIURIL) 25 MG tablet Take 1 tablet (25 mg total) by mouth daily. 90 tablet 1  . pantoprazole (PROTONIX) 40 MG tablet Take 1 tablet (40 mg total) by mouth daily. 90 tablet 1  . potassium chloride SA (K-DUR,KLOR-CON) 20 MEQ tablet Take 1 tablet (20 mEq total) by mouth daily. 90 tablet 1  . ranitidine (ZANTAC) 300 MG tablet Take 1 tablet (300 mg total) by mouth at bedtime. 90 tablet 1  . cetirizine (ZYRTEC) 10 MG tablet Take 1 tablet (10 mg total) by mouth daily. (Patient not taking: Reported on 10/10/2016) 30 tablet 0  . [DISCONTINUED] dicyclomine (BENTYL) 20 MG tablet Take 1 tablet (20 mg total) by mouth every 6 (six) hours as needed for spasms. (Patient not taking: Reported on 04/29/2015) 12 tablet 0  . [DISCONTINUED] etonogestrel-ethinyl estradiol (NUVARING) 0.12-0.015 MG/24HR vaginal ring Insert vaginally and leave in place for 3 consecutive weeks, then remove for 1 week. (Patient not taking: Reported on 06/21/2014) 1 each 12  . [DISCONTINUED] triamterene-hydrochlorothiazide (MAXZIDE-25) 37.5-25 MG per tablet Take 2 tablets by mouth daily. (Patient not taking: Reported on 06/21/2014) 60 tablet 3   No current facility-administered medications on file  prior to visit.     No Known Allergies  Past Medical History:  Diagnosis Date  . Fibroid   . Fibroid uterus 07/29/2013   Left subserosal 3.5 x 3.1 x 2.5 Left Intramural 1.2 x 1.3 x 1.4    . HPV (human papilloma virus) infection   . MDD (major depressive disorder)   . Migraines   . Pregnancy induced hypertension   . PTSD (post-traumatic stress disorder)     Past Surgical History:  Procedure Laterality Date  . CESAREAN SECTION N/A 12/18/2013   Procedure: CESAREAN SECTION;  Surgeon: Truett Mainland, DO;  Location: [facility];  Service: Obstetrics;  Laterality: N/A;  . WISDOM TOOTH EXTRACTION      Family History  Problem Relation Age of Onset  . Hypertension Father   . Cancer Paternal Grandmother        breast cancer  . Hypertension Paternal Grandmother     Social History   Social History  . Marital status: Single    Spouse name: N/A  . Number of children: N/A  . Years of education: N/A   Occupational History  . Not on file.   Social History Main Topics  . Smoking status: Current Every Day Smoker    Types: Cigarettes  . Smokeless tobacco: Never Used  . Alcohol use No  . Drug use: No  . Sexual activity: Yes    Birth control/ protection: None     Comment: last sex today  0500   Other Topics Concern  . Not  on file   Social History Narrative  . No narrative on file   The PMH, PSH, Social History, Family History, Medications, and allergies have been reviewed in [facility], and have been updated if relevant.  OBJECTIVE: BP 126/84 (BP Location: Left Arm, Patient Position: Sitting, Cuff Size: Large)   Pulse 71   Temp 98.8 F (37.1 C) (Oral)   Resp 18   Ht 5\' 1"  (1.549 m)   Wt 185 lb (83.9 kg)   SpO2 96%   BMI 34.96 kg/m   She appears well, vital signs are as noted. Ears normal.  Throat and pharynx normal.  Neck supple. No adenopathy in the neck. Nose is congested. Sinuses non tender. The chest is clear, without wheezes or rales.  ASSESSMENT:  viral upper  respiratory illness  PLAN: Symptomatic therapy suggested: push fluids, rest and return office visit prn if symptoms persist or worsen. Lack of antibiotic effectiveness discussed with her. Call or return to clinic prn if these symptoms worsen or fail to improve as anticipated.

## 2016-10-10 NOTE — Progress Notes (Signed)
Pre visit review using our clinic review tool, if applicable. No additional management support is needed unless otherwise documented below in the visit note. 

## 2016-10-10 NOTE — Patient Instructions (Signed)
Great to see you. I do think this is likely a virus.  Continue what you're taking and tussionex as needed for cough at night.

## 2016-10-12 ENCOUNTER — Ambulatory Visit
Admission: RE | Admit: 2016-10-12 | Discharge: 2016-10-12 | Disposition: A | Discharge status: Home / Self Care | Payer: Federal, State, Local not specified - PPO | Source: Ambulatory Visit | Attending: Nurse Practitioner | Admitting: Nurse Practitioner

## 2016-10-12 DIAGNOSIS — N6489 Other specified disorders of breast: Secondary | ICD-10-CM | POA: Diagnosis not present

## 2016-10-12 DIAGNOSIS — N644 Mastodynia: Secondary | ICD-10-CM

## 2016-10-26 ENCOUNTER — Encounter: Payer: Self-pay | Admitting: Nurse Practitioner

## 2016-10-28 ENCOUNTER — Encounter: Payer: Self-pay | Admitting: Nurse Practitioner

## 2016-11-04 ENCOUNTER — Encounter: Payer: Self-pay | Admitting: Nurse Practitioner

## 2016-11-04 ENCOUNTER — Ambulatory Visit (INDEPENDENT_AMBULATORY_CARE_PROVIDER_SITE_OTHER): Payer: Federal, State, Local not specified - PPO | Admitting: Nurse Practitioner

## 2016-11-04 ENCOUNTER — Ambulatory Visit (INDEPENDENT_AMBULATORY_CARE_PROVIDER_SITE_OTHER)
Admission: RE | Admit: 2016-11-04 | Discharge: 2016-11-04 | Disposition: A | Discharge status: Home / Self Care | Payer: Federal, State, Local not specified - PPO | Source: Ambulatory Visit | Attending: Nurse Practitioner | Admitting: Nurse Practitioner

## 2016-11-04 VITALS — BP 158/98 | HR 55 | Temp 98.5°F | Ht 61.0 in | Wt 187.0 lb

## 2016-11-04 DIAGNOSIS — G43009 Migraine without aura, not intractable, without status migrainosus: Secondary | ICD-10-CM

## 2016-11-04 DIAGNOSIS — R05 Cough: Secondary | ICD-10-CM

## 2016-11-04 DIAGNOSIS — R059 Cough, unspecified: Secondary | ICD-10-CM

## 2016-11-04 DIAGNOSIS — I10 Essential (primary) hypertension: Secondary | ICD-10-CM | POA: Diagnosis not present

## 2016-11-04 DIAGNOSIS — J209 Acute bronchitis, unspecified: Secondary | ICD-10-CM

## 2016-11-04 DIAGNOSIS — Z7251 High risk heterosexual behavior: Secondary | ICD-10-CM

## 2016-11-04 DIAGNOSIS — R079 Chest pain, unspecified: Secondary | ICD-10-CM | POA: Diagnosis not present

## 2016-11-04 DIAGNOSIS — F339 Major depressive disorder, recurrent, unspecified: Secondary | ICD-10-CM | POA: Diagnosis not present

## 2016-11-04 LAB — POCT URINE PREGNANCY: Preg Test, Ur: NEGATIVE

## 2016-11-04 MED ORDER — BENZONATATE 100 MG PO CAPS: 100.0000 mg | ORAL_CAPSULE | Freq: Three times a day (TID) | ORAL | 0 refills | Status: DC | PRN

## 2016-11-04 MED ORDER — BUTALBITAL-APAP-CAFFEINE 50-325-40 MG PO TABS: 1.0000 | ORAL_TABLET | Freq: Four times a day (QID) | ORAL | 0 refills | Status: DC | PRN

## 2016-11-04 MED ORDER — DM-GUAIFENESIN ER 30-600 MG PO TB12: 1.0000 | ORAL_TABLET | Freq: Two times a day (BID) | ORAL | 0 refills | Status: DC | PRN

## 2016-11-04 MED ORDER — ALBUTEROL SULFATE HFA 108 (90 BASE) MCG/ACT IN AERS: 1.0000 | INHALATION_SPRAY | Freq: Four times a day (QID) | RESPIRATORY_TRACT | 0 refills | Status: DC | PRN

## 2016-11-04 MED ORDER — FLUOXETINE HCL 10 MG PO TABS: 10.0000 mg | ORAL_TABLET | Freq: Every day | ORAL | 0 refills | Status: DC

## 2016-11-04 NOTE — Patient Instructions (Addendum)
Normal CXR. Persistent cough is due to acute bronchitis with can take up to 6weeks to resolve. In the absence of fever and normal CXR; OTC cough medication, adequate oral hydration and albuterol is used to manage symptoms  Urine pregnancy was negative.  I instructed pt to start 1/2 tablet once daily for 1 week and then increase to a full tablet once daily on week two as tolerated.   We discussed common side effects such as nausea, drowsiness and weight gain.  Also discussed rare but serious side effect of suicide ideation.  She is instructed to discontinue medication go directly to ED if this occurs.  Pt verbalizes understanding.   Plan follow up in 2weeks to evaluate progress.    I will reassess need for FMLA papers in 2weeks.  Please contact psychology at you r job ASAP for counselling sessions.  Change diet to low carb/low fat diet. Increase daily activity.  Calorie Counting for Weight Loss Calories are units of energy. Your body needs a certain amount of calories from food to keep you going throughout the day. When you eat more calories than your body needs, your body stores the extra calories as fat. When you eat fewer calories than your body needs, your body burns fat to get the energy it needs. Calorie counting means keeping track of how many calories you eat and drink each day. Calorie counting can be helpful if you need to lose weight. If you make sure to eat fewer calories than your body needs, you should lose weight. Ask your health care provider what a healthy weight is for you. For calorie counting to work, you will need to eat the right number of calories in a day in order to lose a healthy amount of weight per week. A dietitian can help you determine how many calories you need in a day and will give you suggestions on how to reach your calorie goal.  A healthy amount of weight to lose per week is usually 1-2 lb (0.5-0.9 kg). This usually means that your daily calorie intake should  be reduced by 500-750 calories.  Eating 1,200 - 1,500 calories per day can help most women lose weight.  Eating 1,500 - 1,800 calories per day can help most men lose weight.  What is my plan? My goal is to have __________ calories per day. If I have this many calories per day, I should lose around __________ pounds per week. What do I need to know about calorie counting? In order to meet your daily calorie goal, you will need to:  Find out how many calories are in each food you would like to eat. Try to do this before you eat.  Decide how much of the food you plan to eat.  Write down what you ate and how many calories it had. Doing this is called keeping a food log.  To successfully lose weight, it is important to balance calorie counting with a healthy lifestyle that includes regular activity. Aim for 150 minutes of moderate exercise (such as walking) or 75 minutes of vigorous exercise (such as running) each week. Where do I find calorie information?  The number of calories in a food can be found on a Nutrition Facts label. If a food does not have a Nutrition Facts label, try to look up the calories online or ask your dietitian for help. Remember that calories are listed per serving. If you choose to have more than one serving of a food, you  will have to multiply the calories per serving by the amount of servings you plan to eat. For example, the label on a package of bread might say that a serving size is 1 slice and that there are 90 calories in a serving. If you eat 1 slice, you will have eaten 90 calories. If you eat 2 slices, you will have eaten 180 calories. How do I keep a food log? Immediately after each meal, record the following information in your food log:  What you ate. Don't forget to include toppings, sauces, and other extras on the food.  How much you ate. This can be measured in cups, ounces, or number of items.  How many calories each food and drink had.  The total  number of calories in the meal.  Keep your food log near you, such as in a small notebook in your pocket, or use a mobile app or website. Some programs will calculate calories for you and show you how many calories you have left for the day to meet your goal. What are some calorie counting tips?  Use your calories on foods and drinks that will fill you up and not leave you hungry: ? Some examples of foods that fill you up are nuts and nut butters, vegetables, lean proteins, and high-fiber foods like whole grains. High-fiber foods are foods with more than 5 g fiber per serving. ? Drinks such as sodas, specialty coffee drinks, alcohol, and juices have a lot of calories, yet do not fill you up.  Eat nutritious foods and avoid empty calories. Empty calories are calories you get from foods or beverages that do not have many vitamins or protein, such as candy, sweets, and soda. It is better to have a nutritious high-calorie food (such as an avocado) than a food with few nutrients (such as a bag of chips).  Know how many calories are in the foods you eat most often. This will help you calculate calorie counts faster.  Pay attention to calories in drinks. Low-calorie drinks include water and unsweetened drinks.  Pay attention to nutrition labels for "low fat" or "fat free" foods. These foods sometimes have the same amount of calories or more calories than the full fat versions. They also often have added sugar, starch, or salt, to make up for flavor that was removed with the fat.  Find a way of tracking calories that works for you. Get creative. Try different apps or programs if writing down calories does not work for you. What are some portion control tips?  Know how many calories are in a serving. This will help you know how many servings of a certain food you can have.  Use a measuring cup to measure serving sizes. You could also try weighing out portions on a kitchen scale. With time, you will be  able to estimate serving sizes for some foods.  Take some time to put servings of different foods on your favorite plates, bowls, and cups so you know what a serving looks like.  Try not to eat straight from a bag or box. Doing this can lead to overeating. Put the amount you would like to eat in a cup or on a plate to make sure you are eating the right portion.  Use smaller plates, glasses, and bowls to prevent overeating.  Try not to multitask (for example, watch TV or use your computer) while eating. If it is time to eat, sit down at a table and enjoy  your food. This will help you to know when you are full. It will also help you to be aware of what you are eating and how much you are eating. What are tips for following this plan? Reading food labels  Check the calorie count compared to the serving size. The serving size may be smaller than what you are used to eating.  Check the source of the calories. Make sure the food you are eating is high in vitamins and protein and low in saturated and trans fats. Shopping  Read nutrition labels while you shop. This will help you make healthy decisions before you decide to purchase your food.  Make a grocery list and stick to it. Cooking  Try to cook your favorite foods in a healthier way. For example, try baking instead of frying.  Use low-fat dairy products. Meal planning  Use more fruits and vegetables. Half of your plate should be fruits and vegetables.  Include lean proteins like poultry and fish. How do I count calories when eating out?  Ask for smaller portion sizes.  Consider sharing an entree and sides instead of getting your own entree.  If you get your own entree, eat only half. Ask for a box at the beginning of your meal and put the rest of your entree in it so you are not tempted to eat it.  If calories are listed on the menu, choose the lower calorie options.  Choose dishes that include vegetables, fruits, whole grains,  low-fat dairy products, and lean protein.  Choose items that are boiled, broiled, grilled, or steamed. Stay away from items that are buttered, battered, fried, or served with cream sauce. Items labeled "crispy" are usually fried, unless stated otherwise.  Choose water, low-fat milk, unsweetened iced tea, or other drinks without added sugar. If you want an alcoholic beverage, choose a lower calorie option such as a glass of wine or light beer.  Ask for dressings, sauces, and syrups on the side. These are usually high in calories, so you should limit the amount you eat.  If you want a salad, choose a garden salad and ask for grilled meats. Avoid extra toppings like bacon, cheese, or fried items. Ask for the dressing on the side, or ask for olive oil and vinegar or lemon to use as dressing.  Estimate how many servings of a food you are given. For example, a serving of cooked rice is  cup or about the size of half a baseball. Knowing serving sizes will help you be aware of how much food you are eating at restaurants. The list below tells you how big or small some common portion sizes are based on everyday objects: ? 1 oz-4 stacked dice. ? 3 oz-1 deck of cards. ? 1 tsp-1 die. ? 1 Tbsp- a ping-pong ball. ? 2 Tbsp-1 ping-pong ball. ?  cup- baseball. ? 1 cup-1 baseball. Summary  Calorie counting means keeping track of how many calories you eat and drink each day. If you eat fewer calories than your body needs, you should lose weight.  A healthy amount of weight to lose per week is usually 1-2 lb (0.5-0.9 kg). This usually means reducing your daily calorie intake by 500-750 calories.  The number of calories in a food can be found on a Nutrition Facts label. If a food does not have a Nutrition Facts label, try to look up the calories online or ask your dietitian for help.  Use your calories on foods and  drinks that will fill you up, and not on foods and drinks that will leave you hungry.  Use  smaller plates, glasses, and bowls to prevent overeating. This information is not intended to replace advice given to you by your health care provider. Make sure you discuss any questions you have with your health care provider. Document Released: 12/22/2004 Document Revised: 11/22/2015 Document Reviewed: 11/22/2015 Elsevier Interactive Patient Education  2017 [employer].

## 2016-11-04 NOTE — Progress Notes (Signed)
Subjective:  Patient ID: Katrina Garcia, female    DOB: [birth year]  Age: 31 y.o. MRN: [id number]  CC: Migraine (migrain--time off consult--miss work 3-4 times a week 12 hrs shift); Depression (depression consult--doesnt want to do anything at all with ADL's/ didnt like med-so just stop. ); Cough (SOB,discomfort near cheast area,couging---going on for 2 wks.?); and Weight Loss (weight loss consult?)   HPI  Depression: Worsening in last 51months (decreased energy, increased appetite, no SI or HI) Dx with severe depression and PTSD in past. Lexapro used in past. Did not like side effects: increased somnolence and numb. Admitted to behavioral health 2014, suicide attempt (drug overdose, intentional). No ETOH or drug use. Suspected FH of mental health disorder, but no formal diagnosis. Reports she has assess to psychology through her job. She will call and schedule.  Headache: Persistent, uncontrolled with naproxen, affecting her job performance Use of naproxen with mild relief. No new symptoms.  Cough: Persistent cough x3weeks. Associated with SOB and chest wall pain. No fever. Some improvement with Rx cough medication and decongestant.  HTN: Elevated today. Reports taking BP medication as prescribed. Does not follow DASH diet. BP Readings from Last 3 Encounters:  11/04/16 (!) 158/98  10/10/16 126/84  09/10/16 (!) 132/100    Outpatient Medications Prior to Visit  Medication Sig Dispense Refill  . cetirizine (ZYRTEC) 10 MG tablet Take 1 tablet (10 mg total) by mouth daily. 30 tablet 0  . cyclobenzaprine (FLEXERIL) 10 MG tablet     . ferrous sulfate 325 (65 FE) MG EC tablet Take 1 tablet (325 mg total) by mouth 3 (three) times daily with meals. 90 tablet 3  . fluticasone (FLONASE) 50 MCG/ACT nasal spray Place 2 sprays into both nostrils daily. 16 g 0  . hydrochlorothiazide (HYDRODIURIL) 25 MG tablet Take 1 tablet (25 mg total) by mouth daily. 90 tablet 1  . pantoprazole (PROTONIX)  40 MG tablet Take 1 tablet (40 mg total) by mouth daily. 90 tablet 1  . potassium chloride SA (K-DUR,KLOR-CON) 20 MEQ tablet Take 1 tablet (20 mEq total) by mouth daily. 90 tablet 1  . ranitidine (ZANTAC) 300 MG tablet Take 1 tablet (300 mg total) by mouth at bedtime. 90 tablet 1  . chlorpheniramine-HYDROcodone (TUSSIONEX PENNKINETIC ER) 10-8 MG/5ML SUER Take 5 mLs by mouth every 12 (twelve) hours as needed. 140 mL 0  . clotrimazole-betamethasone (LOTRISONE) lotion Apply topically 2 (two) times daily. 30 mL 0   No facility-administered medications prior to visit.     ROS See HPI  Objective:  BP (!) 158/98   Pulse (!) 55   Temp 98.5 F (36.9 C)   Ht 5\' 1"  (1.549 m)   Wt 187 lb (84.8 kg)   LMP 11/04/2016 (Approximate)   SpO2 100%   BMI 35.33 kg/m   BP Readings from Last 3 Encounters:  11/04/16 (!) 158/98  10/10/16 126/84  09/10/16 (!) 132/100    Wt Readings from Last 3 Encounters:  11/04/16 187 lb (84.8 kg)  10/10/16 185 lb (83.9 kg)  09/10/16 183 lb (83 kg)    Physical Exam  Constitutional: She is oriented to person, place, and time. No distress.  Neck: No thyromegaly present.  Cardiovascular: Normal rate and regular rhythm.   Pulmonary/Chest: Effort normal and breath sounds normal.  Musculoskeletal: Normal range of motion. She exhibits no edema.  Neurological: She is alert and oriented to person, place, and time.  Skin: Skin is warm and dry.  Psychiatric: Her behavior is normal. Thought  content normal.  Vitals reviewed.   Lab Results  Component Value Date   WBC 7.9 06/03/2016   HGB 10.8 (L) 06/03/2016   HCT 33.0 (L) 06/03/2016   PLT 280 06/03/2016   GLUCOSE 107 (H) 06/03/2016   CHOL 125 05/04/2016   TRIG 77.0 05/04/2016   HDL 45.80 05/04/2016   LDLCALC 64 05/04/2016   ALT 13 (L) 06/03/2016   AST 20 06/03/2016   NA 136 06/03/2016   K 3.2 (L) 06/03/2016   CL 105 06/03/2016   CREATININE 0.67 06/03/2016   BUN 8 06/03/2016   CO2 24 06/03/2016   TSH 0.92  05/04/2016   HGBA1C 6.2 05/04/2016    US Breast Ltd Uni Right Inc Axilla  Result Date: 10/12/2016 CLINICAL DATA:  Focal tenderness in the upper-outer quadrant of the right breast. EXAM: ULTRASOUND OF THE RIGHT BREAST COMPARISON:  Baseline evaluation FINDINGS: On physical exam, I palpate no abnormality in the area of patient's concern in the upper-outer quadrant of the right breast. Targeted ultrasound is performed, showing normal appearing fibroglandular tissue in the upper-outer quadrant of the right breast. No mass, distortion, or acoustic shadowing is demonstrated with ultrasound. IMPRESSION: No ultrasound evidence for malignancy. RECOMMENDATION: Screening mammogram at age 49 unless there are persistent or intervening clinical concerns. (Code:SM-B-40A) I have discussed the findings and recommendations with the patient. Results were also provided in writing at the conclusion of the visit. If applicable, a reminder letter will be sent to the patient regarding the next appointment. BI-RADS CATEGORY  1: Negative. Electronically Signed   By: Nolon Nations M.D.   On: 10/12/2016 11:21    Assessment & Plan:   Katrina Garcia was seen today for migraine, depression, cough and weight loss.  Diagnoses and all orders for this visit:  Essential hypertension  Episode of recurrent major depressive disorder, unspecified depression episode severity (HCC) -     FLUoxetine (PROZAC) 10 MG tablet; Take 1 tablet (10 mg total) by mouth daily.  Migraine without aura and without status migrainosus, not intractable -     butalbital-acetaminophen-caffeine (FIORICET, ESGIC) 50-325-40 MG tablet; Take 1 tablet by mouth every 6 (six) hours as needed for headache.  Unprotected sexual intercourse -     POCT urine pregnancy  Acute bronchitis, unspecified organism -     DG Chest 2 View; Future -     benzonatate (TESSALON) 100 MG capsule; Take 1 capsule (100 mg total) by mouth 3 (three) times daily as needed for cough. -      dextromethorphan-guaiFENesin (MUCINEX DM) 30-600 MG 12hr tablet; Take 1 tablet by mouth 2 (two) times daily as needed for cough. -     albuterol (PROVENTIL HFA;VENTOLIN HFA) 108 (90 Base) MCG/ACT inhaler; Inhale 1-2 puffs into the lungs every 6 (six) hours as needed.   I have discontinued Ms. Hiscox's clotrimazole-betamethasone and chlorpheniramine-HYDROcodone. I am also having her start on FLUoxetine, butalbital-acetaminophen-caffeine, benzonatate, dextromethorphan-guaiFENesin, and albuterol. Additionally, I am having her maintain her cyclobenzaprine, ferrous sulfate, hydrochlorothiazide, pantoprazole, ranitidine, potassium chloride SA, cetirizine, and fluticasone.  Meds ordered this encounter  Medications  . FLUoxetine (PROZAC) 10 MG tablet    Sig: Take 1 tablet (10 mg total) by mouth daily.    Dispense:  30 tablet    Refill:  0    Order Specific Question:   Supervising Provider    Answer:   Cassandria Anger [[id number]]  . butalbital-acetaminophen-caffeine (FIORICET, ESGIC) 50-325-40 MG tablet    Sig: Take 1 tablet by mouth every 6 (six) hours  as needed for headache.    Dispense:  20 tablet    Refill:  0    Order Specific Question:   Supervising Provider    Answer:   Cassandria Anger [[id number]]  . benzonatate (TESSALON) 100 MG capsule    Sig: Take 1 capsule (100 mg total) by mouth 3 (three) times daily as needed for cough.    Dispense:  20 capsule    Refill:  0    Order Specific Question:   Supervising Provider    Answer:   Cassandria Anger [[id number]]  . dextromethorphan-guaiFENesin (MUCINEX DM) 30-600 MG 12hr tablet    Sig: Take 1 tablet by mouth 2 (two) times daily as needed for cough.    Dispense:  14 tablet    Refill:  0    Order Specific Question:   Supervising Provider    Answer:   Cassandria Anger [[id number]]  . albuterol (PROVENTIL HFA;VENTOLIN HFA) 108 (90 Base) MCG/ACT inhaler    Sig: Inhale 1-2 puffs into the lungs every 6 (six) hours as needed.    Dispense:  1 Inhaler     Refill:  0    Order Specific Question:   Supervising Provider    Answer:   Cassandria Anger [[id number]]    Follow-up: Return in about 2 weeks (around 11/18/2016) for depression, HTN, headache.(fasting).  Wilfred Lacy, NP

## 2016-11-04 NOTE — Assessment & Plan Note (Signed)
Elevated BP possibly related to recent use of OTC decongestant. Reassess in 2weeks. Consider adding another agent if still elevated.

## 2016-11-04 NOTE — Assessment & Plan Note (Signed)
Unable to tolerate lexapro in past. Previous suicide attempt and inpatient behavioral health treatment (2014). Prozac prescribed today. F/up in 2weeks

## 2016-11-06 ENCOUNTER — Other Ambulatory Visit: Payer: Self-pay | Admitting: Nurse Practitioner

## 2016-11-06 MED ORDER — ALBUTEROL SULFATE HFA 108 (90 BASE) MCG/ACT IN AERS: 1.0000 | INHALATION_SPRAY | Freq: Four times a day (QID) | RESPIRATORY_TRACT | 0 refills | Status: DC | PRN

## 2016-11-09 ENCOUNTER — Encounter: Payer: Self-pay | Admitting: Nurse Practitioner

## 2016-11-18 ENCOUNTER — Ambulatory Visit: Payer: Federal, State, Local not specified - PPO | Admitting: Nurse Practitioner

## 2016-11-18 ENCOUNTER — Encounter: Payer: Self-pay | Admitting: Nurse Practitioner

## 2016-11-18 VITALS — BP 138/92 | HR 67 | Temp 97.9°F | Ht 61.0 in | Wt 186.0 lb

## 2016-11-18 DIAGNOSIS — F339 Major depressive disorder, recurrent, unspecified: Secondary | ICD-10-CM

## 2016-11-18 DIAGNOSIS — I10 Essential (primary) hypertension: Secondary | ICD-10-CM

## 2016-11-18 DIAGNOSIS — G43009 Migraine without aura, not intractable, without status migrainosus: Secondary | ICD-10-CM | POA: Diagnosis not present

## 2016-11-18 MED ORDER — AMLODIPINE BESYLATE 5 MG PO TABS: 5.0000 mg | ORAL_TABLET | Freq: Every day | ORAL | 3 refills | Status: DC

## 2016-11-18 MED ORDER — FLUOXETINE HCL 10 MG PO TABS: 10.0000 mg | ORAL_TABLET | Freq: Every day | ORAL | 3 refills | Status: DC

## 2016-11-18 NOTE — Patient Instructions (Signed)
I added amlodipine. Start medication tonight. Continue hydrochlorothiazide as prescribed.  Continue current prozac dose.

## 2016-11-18 NOTE — Progress Notes (Signed)
Subjective:  Patient ID: Katrina Garcia, female    DOB: [birth year]  Age: 31 y.o. MRN: [id number]  CC: Follow-up (2 wk follow up---migrain still---didnt get med bx pharmacy didnt have it ready/ )  Headache   This is a recurrent problem. The current episode started more than 1 month ago. The problem occurs intermittently. The problem has been unchanged. The pain is located in the frontal and occipital region. The pain does not radiate. The pain quality is similar to prior headaches. The quality of the pain is described as aching. Pertinent negatives include no abnormal behavior, anorexia, blurred vision, fever, loss of balance, muscle aches, nausea, numbness, phonophobia, photophobia, rhinorrhea, scalp tenderness, seizures, sinus pressure, sore throat, visual change, vomiting, weakness or weight loss. Nothing aggravates the symptoms. She has tried nothing for the symptoms. Her past medical history is significant for hypertension, migraine headaches and obesity. There is no history of cancer, cluster headaches, immunosuppression, migraines in the family, recent head traumas, sinus disease or TMJ.  did not use fiorcet as prescribed.  HTN: Some improvement in SBP with HCTZ. BP Readings from Last 3 Encounters:  11/18/16 (!) 138/92  11/04/16 (!) 158/98  10/10/16 126/84   Depression and Anxiety: Improved mood with prozac. Denies any adverse effects.  Outpatient Medications Prior to Visit  Medication Sig Dispense Refill  . albuterol (PROAIR HFA) 108 (90 Base) MCG/ACT inhaler Inhale 1-2 puffs into the lungs every 6 (six) hours as needed for wheezing or shortness of breath. 1 Inhaler 0  . butalbital-acetaminophen-caffeine (FIORICET, ESGIC) 50-325-40 MG tablet Take 1 tablet by mouth every 6 (six) hours as needed for headache. 20 tablet 0  . cetirizine (ZYRTEC) 10 MG tablet Take 1 tablet (10 mg total) by mouth daily. 30 tablet 0  . cyclobenzaprine (FLEXERIL) 10 MG tablet     . ferrous sulfate 325 (65 FE)  MG EC tablet Take 1 tablet (325 mg total) by mouth 3 (three) times daily with meals. 90 tablet 3  . hydrochlorothiazide (HYDRODIURIL) 25 MG tablet Take 1 tablet (25 mg total) by mouth daily. 90 tablet 1  . pantoprazole (PROTONIX) 40 MG tablet Take 1 tablet (40 mg total) by mouth daily. 90 tablet 1  . potassium chloride SA (K-DUR,KLOR-CON) 20 MEQ tablet Take 1 tablet (20 mEq total) by mouth daily. 90 tablet 1  . ranitidine (ZANTAC) 300 MG tablet Take 1 tablet (300 mg total) by mouth at bedtime. 90 tablet 1  . benzonatate (TESSALON) 100 MG capsule Take 1 capsule (100 mg total) by mouth 3 (three) times daily as needed for cough. 20 capsule 0  . dextromethorphan-guaiFENesin (MUCINEX DM) 30-600 MG 12hr tablet Take 1 tablet by mouth 2 (two) times daily as needed for cough. 14 tablet 0  . FLUoxetine (PROZAC) 10 MG tablet Take 1 tablet (10 mg total) by mouth daily. 30 tablet 0  . fluticasone (FLONASE) 50 MCG/ACT nasal spray Place 2 sprays into both nostrils daily. 16 g 0   No facility-administered medications prior to visit.     ROS See HPI  Objective:  BP (!) 138/92   Pulse 67   Temp 97.9 F (36.6 C)   Ht 5\' 1"  (1.549 m)   Wt 186 lb (84.4 kg)   LMP 11/04/2016 (Approximate)   SpO2 99%   BMI 35.14 kg/m   BP Readings from Last 3 Encounters:  11/18/16 (!) 138/92  11/04/16 (!) 158/98  10/10/16 126/84    Wt Readings from Last 3 Encounters:  11/18/16 186 lb (84.4  kg)  11/04/16 187 lb (84.8 kg)  10/10/16 185 lb (83.9 kg)    Physical Exam  Constitutional: She is oriented to person, place, and time. No distress.  Cardiovascular: Normal rate and regular rhythm.  Pulmonary/Chest: Effort normal and breath sounds normal.  Musculoskeletal: She exhibits no edema.  Neurological: She is alert and oriented to person, place, and time.  Vitals reviewed.   Lab Results  Component Value Date   WBC 7.9 06/03/2016   HGB 10.8 (L) 06/03/2016   HCT 33.0 (L) 06/03/2016   PLT 280 06/03/2016    GLUCOSE 107 (H) 06/03/2016   CHOL 125 05/04/2016   TRIG 77.0 05/04/2016   HDL 45.80 05/04/2016   LDLCALC 64 05/04/2016   ALT 13 (L) 06/03/2016   AST 20 06/03/2016   NA 136 06/03/2016   K 3.2 (L) 06/03/2016   CL 105 06/03/2016   CREATININE 0.67 06/03/2016   BUN 8 06/03/2016   CO2 24 06/03/2016   TSH 0.92 05/04/2016   HGBA1C 6.2 05/04/2016    Dg Chest 2 View  Result Date: 11/04/2016 CLINICAL DATA:  Intermittent right-sided chest pain over the past week. Worsening dyspnea. Former smoker. History of hypertension. EXAM: CHEST  2 VIEW COMPARISON:  Chest x-ray of Jun 03, 2016 FINDINGS: The lungs are well-expanded and clear. The heart and pulmonary vascularity are normal. The mediastinum is normal in width. There is no pleural effusion. The bony thorax is unremarkable. IMPRESSION: No active cardiopulmonary disease. Electronically Signed   By: David  Martinique M.D.   On: 11/04/2016 09:36    Assessment & Plan:   Jaquayla was seen today for follow-up.  Diagnoses and all orders for this visit:  Essential hypertension -     amLODipine (NORVASC) 5 MG tablet; Take 1 tablet (5 mg total) daily by mouth.  Episode of recurrent major depressive disorder, unspecified depression episode severity (HCC) -     FLUoxetine (PROZAC) 10 MG tablet; Take 1 tablet (10 mg total) daily by mouth.  Migraine without aura and without status migrainosus, not intractable   I have discontinued Udell Catano's fluticasone, benzonatate, and dextromethorphan-guaiFENesin. I have also changed her FLUoxetine. Additionally, I am having her start on amLODipine. Lastly, I am having her maintain her cyclobenzaprine, ferrous sulfate, hydrochlorothiazide, pantoprazole, ranitidine, potassium chloride SA, cetirizine, butalbital-acetaminophen-caffeine, and albuterol.  Meds ordered this encounter  Medications  . amLODipine (NORVASC) 5 MG tablet    Sig: Take 1 tablet (5 mg total) daily by mouth.    Dispense:  30 tablet    Refill:  3     Order Specific Question:   Supervising Provider    Answer:   Cassandria Anger [[id number]]  . FLUoxetine (PROZAC) 10 MG tablet    Sig: Take 1 tablet (10 mg total) daily by mouth.    Dispense:  30 tablet    Refill:  3    Order Specific Question:   Supervising Provider    Answer:   Cassandria Anger [[id number]]    Follow-up: Return in about 3 months (around 02/18/2017) for HTN and depression at [facility] location.  Wilfred Lacy, NP

## 2016-12-07 ENCOUNTER — Encounter: Payer: Self-pay | Admitting: Family Medicine

## 2016-12-11 ENCOUNTER — Encounter: Payer: Self-pay | Admitting: Nurse Practitioner

## 2016-12-11 ENCOUNTER — Ambulatory Visit: Payer: Federal, State, Local not specified - PPO | Admitting: Nurse Practitioner

## 2016-12-11 VITALS — BP 154/98 | HR 62 | Temp 98.1°F | Ht 61.0 in | Wt 177.0 lb

## 2016-12-11 DIAGNOSIS — J0140 Acute pansinusitis, unspecified: Secondary | ICD-10-CM

## 2016-12-11 DIAGNOSIS — J029 Acute pharyngitis, unspecified: Secondary | ICD-10-CM

## 2016-12-11 DIAGNOSIS — I10 Essential (primary) hypertension: Secondary | ICD-10-CM | POA: Diagnosis not present

## 2016-12-11 MED ORDER — CEFUROXIME AXETIL 500 MG PO TABS: 500.0000 mg | ORAL_TABLET | Freq: Two times a day (BID) | ORAL | 0 refills | Status: DC

## 2016-12-11 MED ORDER — FLUTICASONE PROPIONATE 50 MCG/ACT NA SUSP: 2.0000 | Freq: Every day | NASAL | 0 refills | Status: DC

## 2016-12-11 MED ORDER — IBUPROFEN 800 MG PO TABS: 800.0000 mg | ORAL_TABLET | Freq: Three times a day (TID) | ORAL | 0 refills | Status: DC | PRN

## 2016-12-11 MED ORDER — DM-GUAIFENESIN ER 30-600 MG PO TB12: 1.0000 | ORAL_TABLET | Freq: Two times a day (BID) | ORAL | 0 refills | Status: DC | PRN

## 2016-12-11 MED ORDER — OXYMETAZOLINE HCL 0.05 % NA SOLN: 1.0000 | Freq: Two times a day (BID) | NASAL | 0 refills | Status: DC

## 2016-12-11 NOTE — Patient Instructions (Signed)
[facility] Instructions: Flonase and Afrin use: apply 1spray of afrin in each nare, wait 69mins, then apply 2sprays of flonase in each nare. Use both nasal spray consecutively x 3days, then flonase only for at least 14days.  Encourage adequate oral hydration.  Use over-the-counter  "cold" medicines  such as "Tylenol cold" , "Advil cold",  "Mucinex" or" Mucinex D"  for cough and congestion.  Avoid decongestants if you have high blood pressure. Use" Delsym" or" Robitussin" cough syrup varietis for cough.  You can use plain "Tylenol" or "Advi"l for fever, chills and achyness.   The medications listed that were affected by recall are combination drugs. Please resume BP medications as prescribed. You can also verify with your pharmacist about the recall.

## 2016-12-11 NOTE — Progress Notes (Signed)
Subjective:  Patient ID: Katrina Garcia, female    DOB: 1985-09-24  Age: 31 y.o. MRN: [id number]  CC: Sore Throat (sore throat,painful,cant sleep/green mucus when cough/worse at night/ 1 wk/ took OTC. ) and Medication Problem (BP med consult/side effect)   Sinusitis  This is a new problem. The current episode started in the past 7 days. The problem has been gradually worsening since onset. There has been no fever. Associated symptoms include congestion, coughing, headaches, a hoarse voice, sinus pressure, a sore throat and swollen glands. Pertinent negatives include no chills, diaphoresis, ear pain, neck pain or shortness of breath. Past treatments include acetaminophen and oral decongestants. The treatment provided mild relief.    HTN: Uncontrolled Has not taken BP medication since Monday. She is concerned her medication was affected by pharmaceutical recall. BP Readings from Last 3 Encounters:  12/11/16 (!) 154/98  11/18/16 (!) 138/92  11/04/16 (!) 158/98    Outpatient Medications Prior to Visit  Medication Sig Dispense Refill  . albuterol (PROAIR HFA) 108 (90 Base) MCG/ACT inhaler Inhale 1-2 puffs into the lungs every 6 (six) hours as needed for wheezing or shortness of breath. 1 Inhaler 0  . butalbital-acetaminophen-caffeine (FIORICET, ESGIC) 50-325-40 MG tablet Take 1 tablet by mouth every 6 (six) hours as needed for headache. 20 tablet 0  . cetirizine (ZYRTEC) 10 MG tablet Take 1 tablet (10 mg total) by mouth daily. 30 tablet 0  . cyclobenzaprine (FLEXERIL) 10 MG tablet     . ferrous sulfate 325 (65 FE) MG EC tablet Take 1 tablet (325 mg total) by mouth 3 (three) times daily with meals. 90 tablet 3  . FLUoxetine (PROZAC) 10 MG tablet Take 1 tablet (10 mg total) daily by mouth. 30 tablet 3  . pantoprazole (PROTONIX) 40 MG tablet Take 1 tablet (40 mg total) by mouth daily. 90 tablet 1  . potassium chloride SA (K-DUR,KLOR-CON) 20 MEQ tablet Take 1 tablet (20 mEq total) by mouth daily.  90 tablet 1  . amLODipine (NORVASC) 5 MG tablet Take 1 tablet (5 mg total) daily by mouth. (Patient not taking: Reported on 12/11/2016) 30 tablet 3  . hydrochlorothiazide (HYDRODIURIL) 25 MG tablet Take 1 tablet (25 mg total) by mouth daily. (Patient not taking: Reported on 12/11/2016) 90 tablet 1  . ranitidine (ZANTAC) 300 MG tablet Take 1 tablet (300 mg total) by mouth at bedtime. (Patient not taking: Reported on 12/11/2016) 90 tablet 1   No facility-administered medications prior to visit.     ROS See HPI  Objective:  BP (!) 154/98   Pulse 62   Temp 98.1 F (36.7 C) (Oral)   Ht 5\' 1"  (1.549 m)   Wt 177 lb (80.3 kg)   SpO2 99%   BMI 33.44 kg/m   BP Readings from Last 3 Encounters:  12/11/16 (!) 154/98  11/18/16 (!) 138/92  11/04/16 (!) 158/98    Wt Readings from Last 3 Encounters:  12/11/16 177 lb (80.3 kg)  11/18/16 186 lb (84.4 kg)  11/04/16 187 lb (84.8 kg)    Physical Exam  Constitutional: She is oriented to person, place, and time.  HENT:  Right Ear: Tympanic membrane, external ear and ear canal normal.  Left Ear: Tympanic membrane, external ear and ear canal normal.  Nose: Mucosal edema and rhinorrhea present. Right sinus exhibits maxillary sinus tenderness. Right sinus exhibits no frontal sinus tenderness. Left sinus exhibits maxillary sinus tenderness. Left sinus exhibits no frontal sinus tenderness.  Mouth/Throat: Uvula is midline. No trismus in  the jaw. Posterior oropharyngeal erythema present. No oropharyngeal exudate.  Eyes: No scleral icterus.  Neck: Normal range of motion. Neck supple.  Cardiovascular: Normal rate and normal heart sounds.  Pulmonary/Chest: Effort normal and breath sounds normal.  Musculoskeletal: She exhibits no edema.  Lymphadenopathy:    She has cervical adenopathy.  Neurological: She is alert and oriented to person, place, and time.  Vitals reviewed.   Lab Results  Component Value Date   WBC 7.9 06/03/2016   HGB 10.8 (L)  06/03/2016   HCT 33.0 (L) 06/03/2016   PLT 280 06/03/2016   GLUCOSE 107 (H) 06/03/2016   CHOL 125 05/04/2016   TRIG 77.0 05/04/2016   HDL 45.80 05/04/2016   LDLCALC 64 05/04/2016   ALT 13 (L) 06/03/2016   AST 20 06/03/2016   NA 136 06/03/2016   K 3.2 (L) 06/03/2016   CL 105 06/03/2016   CREATININE 0.67 06/03/2016   BUN 8 06/03/2016   CO2 24 06/03/2016   TSH 0.92 05/04/2016   HGBA1C 6.2 05/04/2016    Dg Chest 2 View  Result Date: 11/04/2016 CLINICAL DATA:  Intermittent right-sided chest pain over the past week. Worsening dyspnea. Former smoker. History of hypertension. EXAM: CHEST  2 VIEW COMPARISON:  Chest x-ray of Jun 03, 2016 FINDINGS: The lungs are well-expanded and clear. The heart and pulmonary vascularity are normal. The mediastinum is normal in width. There is no pleural effusion. The bony thorax is unremarkable. IMPRESSION: No active cardiopulmonary disease. Electronically Signed   By: David  Martinique M.D.   On: 11/04/2016 09:36    Assessment & Plan:   Katrina Garcia was seen today for sore throat and medication problem.  Diagnoses and all orders for this visit:  Acute non-recurrent pansinusitis -     fluticasone (FLONASE) 50 MCG/ACT nasal spray; Place 2 sprays into both nostrils daily. -     oxymetazoline (AFRIN NASAL SPRAY) 0.05 % nasal spray; Place 1 spray into both nostrils 2 (two) times daily. Use only for 3days, then stop -     dextromethorphan-guaiFENesin (MUCINEX DM) 30-600 MG 12hr tablet; Take 1 tablet by mouth 2 (two) times daily as needed for cough. -     ibuprofen (ADVIL,MOTRIN) 800 MG tablet; Take 1 tablet (800 mg total) by mouth every 8 (eight) hours as needed. With food -     cefUROXime (CEFTIN) 500 MG tablet; Take 1 tablet (500 mg total) by mouth 2 (two) times daily with a meal.  Essential hypertension  Acute pharyngitis, unspecified etiology -     fluticasone (FLONASE) 50 MCG/ACT nasal spray; Place 2 sprays into both nostrils daily. -     oxymetazoline (AFRIN  NASAL SPRAY) 0.05 % nasal spray; Place 1 spray into both nostrils 2 (two) times daily. Use only for 3days, then stop -     dextromethorphan-guaiFENesin (MUCINEX DM) 30-600 MG 12hr tablet; Take 1 tablet by mouth 2 (two) times daily as needed for cough. -     ibuprofen (ADVIL,MOTRIN) 800 MG tablet; Take 1 tablet (800 mg total) by mouth every 8 (eight) hours as needed. With food -     cefUROXime (CEFTIN) 500 MG tablet; Take 1 tablet (500 mg total) by mouth 2 (two) times daily with a meal.   I am having Katrina Garcia start on fluticasone, oxymetazoline, dextromethorphan-guaiFENesin, ibuprofen, and cefUROXime. I am also having her maintain her cyclobenzaprine, ferrous sulfate, hydrochlorothiazide, pantoprazole, ranitidine, potassium chloride SA, cetirizine, butalbital-acetaminophen-caffeine, albuterol, amLODipine, and FLUoxetine.  Meds ordered this encounter  Medications  . fluticasone (  FLONASE) 50 MCG/ACT nasal spray    Sig: Place 2 sprays into both nostrils daily.    Dispense:  16 g    Refill:  0    Order Specific Question:   Supervising Provider    Answer:   Lucille Passy [[id number]]  . oxymetazoline (AFRIN NASAL SPRAY) 0.05 % nasal spray    Sig: Place 1 spray into both nostrils 2 (two) times daily. Use only for 3days, then stop    Dispense:  30 mL    Refill:  0    Order Specific Question:   Supervising Provider    Answer:   Lucille Passy [[id number]]  . dextromethorphan-guaiFENesin (MUCINEX DM) 30-600 MG 12hr tablet    Sig: Take 1 tablet by mouth 2 (two) times daily as needed for cough.    Dispense:  14 tablet    Refill:  0    Order Specific Question:   Supervising Provider    Answer:   Lucille Passy [[id number]]  . ibuprofen (ADVIL,MOTRIN) 800 MG tablet    Sig: Take 1 tablet (800 mg total) by mouth every 8 (eight) hours as needed. With food    Dispense:  30 tablet    Refill:  0    Order Specific Question:   Supervising Provider    Answer:   Lucille Passy [[id number]]  . cefUROXime (CEFTIN) 500 MG tablet     Sig: Take 1 tablet (500 mg total) by mouth 2 (two) times daily with a meal.    Dispense:  14 tablet    Refill:  0    Order Specific Question:   Supervising Provider    Answer:   Lucille Passy [[id number]]    Follow-up: No Follow-up on file.  Wilfred Lacy, NP

## 2016-12-19 ENCOUNTER — Encounter: Payer: Self-pay | Admitting: Nurse Practitioner

## 2017-01-08 ENCOUNTER — Ambulatory Visit: Payer: Self-pay | Admitting: *Deleted

## 2017-01-08 NOTE — Telephone Encounter (Signed)
Called in c/o feeling dizzy and having a headache since this morning along with feeling weak.  The HA is in the back of her head and down into her neck.   She mentioned having a history of headaches but this one doesn't feel like her usual headaches.  She left work early due to being dizzy and feeling weak with a headache.  I made an appt for her with Jodi Mourning, NP [facility] on 01/09/17 at 10:00.  Reason for Disposition . [1] MILD dizziness (e.g., vertigo; walking normally) AND [2] has NOT been evaluated by physician for this  Answer Assessment - Initial Assessment Questions 1. DESCRIPTION: "Describe your dizziness."     I feel like the room is leaning but I'm standing still at a machine. 2. LIGHTHEADED: "Do you feel lightheaded?" (e.g., somewhat faint, woozy, weak upon standing)     I have a weak feeling when I woke up but the dizziness started a little while after I ate. 3. VERTIGO: "Do you feel like either you or the room is spinning or tilting?" (i.e. vertigo)     See above 4. SEVERITY: "How bad is it?"  "Do you feel like you are going to faint?" "Can you stand and walk?"   - MILD - walking normally   - MODERATE - interferes with normal activities (e.g., work, school)    - SEVERE - unable to stand, requires support to walk, feels like passing out now.      I felt like I was going to faint when the dizzy spell hit.  Had 4 dizzy spells today.   I can walk without assistance I just feel more weak. 5. ONSET:  "When did the dizziness begin?"     This morning 6. AGGRAVATING FACTORS: "Does anything make it worse?" (e.g., standing, change in head position)     Turning my head makes me more dizzy.   Sitting down and getting up does not bother me. 7. HEART RATE: "Can you tell me your heart rate?" "How many beats in 15 seconds?"  (Note: not all patients can do this)        8. CAUSE: "What do you think is causing the dizziness?"     Yes I had dizziness before.  A month ago.   I just  rested.   Eating did not make me feel any better 9. RECURRENT SYMPTOM: "Have you had dizziness before?" If so, ask: "When was the last time?" "What happened that time?"     Yes a month ago. 10. OTHER SYMPTOMS: "Do you have any other symptoms?" (e.g., fever, chest pain, vomiting, diarrhea, bleeding)       Chest pain this morning with pain into my left shoulder.   It started after the dizziness and weakness started this morning and lasted about 20 min.   It's starting back now but not as strong as it was.  It feels like achy not tight.   11. PREGNANCY: "Is there any chance you are pregnant?" "When was your last menstrual period?"       No.   Last period was Christmas Day.  Protocols used: DIZZINESS - VERTIGO-A-AH, [place]

## 2017-01-09 ENCOUNTER — Ambulatory Visit: Payer: Federal, State, Local not specified - PPO | Admitting: Family

## 2017-01-09 ENCOUNTER — Encounter: Payer: Self-pay | Admitting: Family

## 2017-02-07 ENCOUNTER — Encounter (HOSPITAL_COMMUNITY): Payer: Self-pay | Admitting: *Deleted

## 2017-02-07 ENCOUNTER — Other Ambulatory Visit: Payer: Self-pay

## 2017-02-07 ENCOUNTER — Ambulatory Visit (HOSPITAL_COMMUNITY)
Admission: EM | Admit: 2017-02-07 | Discharge: 2017-02-07 | Disposition: A | Discharge status: Home / Self Care | Payer: Federal, State, Local not specified - PPO | Attending: Family Medicine | Admitting: Family Medicine

## 2017-02-07 DIAGNOSIS — K529 Noninfective gastroenteritis and colitis, unspecified: Secondary | ICD-10-CM

## 2017-02-07 MED ORDER — ONDANSETRON HCL 4 MG PO TABS: 4.0000 mg | ORAL_TABLET | Freq: Three times a day (TID) | ORAL | 0 refills | Status: DC | PRN

## 2017-02-07 NOTE — ED Provider Notes (Signed)
[place]    CSN: [id number] Arrival date & time: 02/07/17  1344     History   Chief Complaint Chief Complaint  Patient presents with  . Diarrhea  . Fever    HPI Nannie Starzyk is a 32 y.o. female.   Presents with diarrhea, fevers (max 101) and N, V x 24 hours. Her son has similar symptoms. No abdominal pain, or bloody diarrhea. No respiratory symptoms are noted. See ROS      Past Medical History:  Diagnosis Date  . Fibroid   . Fibroid uterus 07/29/2013   Left subserosal 3.5 x 3.1 x 2.5 Left Intramural 1.2 x 1.3 x 1.4    . HPV (human papilloma virus) infection   . MDD (major depressive disorder)   . Migraines   . Pregnancy induced hypertension   . PTSD (post-traumatic stress disorder)     Patient Active Problem List   Diagnosis Date Noted  . Contraception management 01/22/2014  . Hypertension 01/22/2014  . Fibroid uterus 07/29/2013  . MDD (major depressive disorder) 08/12/2012  . PTSD (post-traumatic stress disorder) 08/12/2012    Past Surgical History:  Procedure Laterality Date  . CESAREAN SECTION N/A 12/18/2013   Procedure: CESAREAN SECTION;  Surgeon: Truett Mainland, DO;  Location: [facility];  Service: Obstetrics;  Laterality: N/A;  . WISDOM TOOTH EXTRACTION      OB History    Gravida Para Term Preterm AB Living   1 1   1   1    SAB TAB Ectopic Multiple Live Births         0 1       Home Medications    Prior to Admission medications   Medication Sig Start Date End Date Taking? Authorizing Provider  amLODipine (NORVASC) 5 MG tablet Take 1 tablet (5 mg total) daily by mouth. 11/18/16  Yes Nche, Charlene Brooke, NP  FLUoxetine (PROZAC) 10 MG tablet Take 1 tablet (10 mg total) daily by mouth. 11/18/16  Yes Nche, Charlene Brooke, NP  hydrochlorothiazide (HYDRODIURIL) 25 MG tablet Take 1 tablet (25 mg total) by mouth daily. 08/17/16  Yes Nche, Charlene Brooke, NP  albuterol (PROAIR HFA) 108 (90 Base) MCG/ACT inhaler Inhale 1-2 puffs into the lungs every  6 (six) hours as needed for wheezing or shortness of breath. 11/06/16   Nche, Charlene Brooke, NP  butalbital-acetaminophen-caffeine (FIORICET, ESGIC) 50-325-40 MG tablet Take 1 tablet by mouth every 6 (six) hours as needed for headache. 11/04/16 11/04/17  Nche, Charlene Brooke, NP  cefUROXime (CEFTIN) 500 MG tablet Take 1 tablet (500 mg total) by mouth 2 (two) times daily with a meal. 12/11/16   Nche, Charlene Brooke, NP  cetirizine (ZYRTEC) 10 MG tablet Take 1 tablet (10 mg total) by mouth daily. 09/10/16   Nche, Charlene Brooke, NP  cyclobenzaprine (FLEXERIL) 10 MG tablet  04/23/16   [provider]  dextromethorphan-guaiFENesin (MUCINEX DM) 30-600 MG 12hr tablet Take 1 tablet by mouth 2 (two) times daily as needed for cough. 12/11/16   Nche, Charlene Brooke, NP  ferrous sulfate 325 (65 FE) MG EC tablet Take 1 tablet (325 mg total) by mouth 3 (three) times daily with meals. 07/15/16   Nche, Charlene Brooke, NP  fluticasone (FLONASE) 50 MCG/ACT nasal spray Place 2 sprays into both nostrils daily. 12/11/16   Nche, Charlene Brooke, NP  ibuprofen (ADVIL,MOTRIN) 800 MG tablet Take 1 tablet (800 mg total) by mouth every 8 (eight) hours as needed. With food 12/11/16   Nche, Charlene Brooke, NP  oxymetazoline (AFRIN NASAL SPRAY) 0.05 % nasal spray Place 1 spray into both nostrils 2 (two) times daily. Use only for 3days, then stop 12/11/16   Nche, Charlene Brooke, NP  pantoprazole (PROTONIX) 40 MG tablet Take 1 tablet (40 mg total) by mouth daily. 08/17/16   Nche, Charlene Brooke, NP  potassium chloride SA (K-DUR,KLOR-CON) 20 MEQ tablet Take 1 tablet (20 mEq total) by mouth daily. 08/17/16   Nche, Charlene Brooke, NP  ranitidine (ZANTAC) 300 MG tablet Take 1 tablet (300 mg total) by mouth at bedtime. Patient not taking: Reported on 12/11/2016 08/17/16   Nche, Charlene Brooke, NP  dicyclomine (BENTYL) 20 MG tablet Take 1 tablet (20 mg total) by mouth every 6 (six) hours as needed for spasms. Patient not taking: Reported on 04/29/2015  10/07/14 04/29/15  Virgel Manifold, MD  etonogestrel-ethinyl estradiol (NUVARING) 0.12-0.015 MG/24HR vaginal ring Insert vaginally and leave in place for 3 consecutive weeks, then remove for 1 week. Patient not taking: Reported on 06/21/2014 01/22/14 04/29/15  Olegario Messier, NP  triamterene-hydrochlorothiazide (MAXZIDE-25) 37.5-25 MG per tablet Take 2 tablets by mouth daily. Patient not taking: Reported on 06/21/2014 01/22/14 04/29/15  BarefootConnye Burkitt, NP    Family History Family History  Problem Relation Age of Onset  . Hypertension Father   . Cancer Paternal Grandmother        breast cancer  . Hypertension Paternal Grandmother   . Breast cancer Paternal Grandmother     Social History Social History   Tobacco Use  . Smoking status: Current Every Day Smoker    Types: Cigarettes  . Smokeless tobacco: Never Used  Substance Use Topics  . Alcohol use: No  . Drug use: No     Allergies   Patient has no known allergies.   Review of Systems Review of Systems  Constitutional: Positive for fatigue and fever.  HENT: Negative.   Respiratory: Negative for cough and shortness of breath.   Gastrointestinal: Positive for diarrhea, nausea and vomiting. Negative for abdominal distention, abdominal pain, blood in stool and rectal pain.  Genitourinary: Negative.   Neurological: Negative.      Physical Exam Triage Vital Signs ED Triage Vitals  Enc Vitals Group     BP 02/07/17 1434 133/88     Pulse Rate 02/07/17 1435 90     Resp 02/07/17 1435 16     Temp 02/07/17 1434 98.8 F (37.1 C)     Temp Source 02/07/17 1434 Oral     SpO2 02/07/17 1435 100 %     Weight --      Height --      Head Circumference --      Peak Flow --      Pain Score 02/07/17 1436 4     Pain Loc --      Pain Edu? --      Excl. in [place]? --    No data found.  Updated Vital Signs BP 133/88   Pulse 90   Temp 98.8 F (37.1 C) (Oral)   Resp 16   SpO2 100%   Visual Acuity Right Eye Distance:   Left Eye  Distance:   Bilateral Distance:    Right Eye Near:   Left Eye Near:    Bilateral Near:     Physical Exam  Constitutional: She is oriented to person, place, and time. She appears well-developed and well-nourished. No distress.  Pulmonary/Chest: Effort normal and breath sounds normal.  Abdominal: Soft. Bowel sounds are normal. She exhibits no  distension and no mass. There is no tenderness.  Neurological: She is alert and oriented to person, place, and time.  Skin: Skin is warm and dry. She is not diaphoretic.  Psychiatric: Her behavior is normal.  Nursing note and vitals reviewed.    UC Treatments / Results  Labs (all labs ordered are listed, but only abnormal results are displayed) Labs Reviewed - No data to display  EKG  EKG Interpretation None       Radiology No results found.  Procedures Procedures (including critical care time)  Medications Ordered in UC Medications - No data to display   Initial Impression / Assessment and Plan / UC Course  I have reviewed the triage vital signs and the nursing notes.  Pertinent labs & imaging results that were available during my care of the patient were reviewed by me and considered in my medical decision making (see chart for details).     Treat symptomatically with Zofran, rest and fluids. Work note given. Follow up if worsens or concerns. Stable for D/C.   Final Clinical Impressions(s) / UC Diagnoses   Final diagnoses:  None    ED Discharge Orders    None       Controlled Substance Prescriptions [place] Controlled Substance Registry consulted? Not Applicable   Bjorn Pippin, PA-C 02/07/17 1457

## 2017-02-07 NOTE — ED Triage Notes (Signed)
C/O n/v/d with temp up to 101.6 at home.  Has been able to keep down french fries, some chicken nuggets, and some ginger ale & Gatorade, but c/o continued nausea.  C/O diarrhea - 2-3 episodes per day.

## 2017-02-07 NOTE — Discharge Instructions (Signed)
You have a GI virus. May use the Zofran as needed for nausea. Rest and sip fluids. Avoid high fat foods. Feel better. If worsens f/u here or with your PCP.

## 2017-02-08 ENCOUNTER — Encounter: Payer: Self-pay | Admitting: Nurse Practitioner

## 2017-02-11 ENCOUNTER — Ambulatory Visit: Payer: Federal, State, Local not specified - PPO | Admitting: Nurse Practitioner

## 2017-02-11 ENCOUNTER — Ambulatory Visit: Payer: Self-pay | Admitting: Nurse Practitioner

## 2017-02-17 ENCOUNTER — Ambulatory Visit (HOSPITAL_COMMUNITY)
Admission: EM | Admit: 2017-02-17 | Discharge: 2017-02-17 | Disposition: A | Discharge status: Home / Self Care | Payer: Federal, State, Local not specified - PPO | Attending: Family Medicine | Admitting: Family Medicine

## 2017-02-17 ENCOUNTER — Encounter (HOSPITAL_COMMUNITY): Payer: Self-pay | Admitting: Emergency Medicine

## 2017-02-17 ENCOUNTER — Other Ambulatory Visit: Payer: Self-pay

## 2017-02-17 DIAGNOSIS — J111 Influenza due to unidentified influenza virus with other respiratory manifestations: Secondary | ICD-10-CM

## 2017-02-17 DIAGNOSIS — R69 Illness, unspecified: Secondary | ICD-10-CM | POA: Diagnosis not present

## 2017-02-17 MED ORDER — OSELTAMIVIR PHOSPHATE 75 MG PO CAPS: 75.0000 mg | ORAL_CAPSULE | Freq: Two times a day (BID) | ORAL | 0 refills | Status: AC

## 2017-02-17 MED ORDER — ACETAMINOPHEN 325 MG PO TABS
ORAL_TABLET | ORAL | Status: AC
  Filled 2017-02-17: qty 2

## 2017-02-17 MED ORDER — ACETAMINOPHEN 325 MG PO TABS
650.0000 mg | ORAL_TABLET | Freq: Once | ORAL | Status: AC
  Administered 2017-02-17: 650 mg via ORAL

## 2017-02-17 MED ORDER — HYDROCODONE-HOMATROPINE 5-1.5 MG/5ML PO SYRP: 5.0000 mL | ORAL_SOLUTION | Freq: Four times a day (QID) | ORAL | 0 refills | Status: DC | PRN

## 2017-02-17 NOTE — ED Triage Notes (Addendum)
Pt reports a cough that started on Monday.  She has since developed nasal congestion and fever.  She reports her highest fever at 102.1 at 0300 this morning.  Pt was diagnosed with a stomach virus about ten days ago.

## 2017-02-17 NOTE — Discharge Instructions (Signed)

## 2017-02-23 ENCOUNTER — Encounter: Payer: Self-pay | Admitting: Nurse Practitioner

## 2017-02-24 NOTE — ED Provider Notes (Signed)
[place]   [id number] 02/17/17 Arrival Time: 8144  ASSESSMENT & PLAN:  1. Influenza-like illness     Meds ordered this encounter  Medications  . acetaminophen (TYLENOL) tablet 650 mg  . HYDROcodone-homatropine (HYCODAN) 5-1.5 MG/5ML syrup    Sig: Take 5 mLs by mouth every 6 (six) hours as needed for cough.    Dispense:  90 mL    Refill:  0  . oseltamivir (TAMIFLU) 75 MG capsule    Sig: Take 1 capsule (75 mg total) by mouth 2 (two) times daily for 5 days.    Dispense:  10 capsule    Refill:  0   Cough medication sedation precautions. Discussed typical duration of symptoms. OTC symptom care as needed. Ensure adequate fluid intake and rest. May f/u with PCP or here as needed.  Reviewed expectations re: course of current medical issues. Questions answered. Outlined signs and symptoms indicating need for more acute intervention. Patient verbalized understanding. After Visit Summary given.   SUBJECTIVE: History from: patient.  Mariadelaluz Guggenheim is a 32 y.o. female who presents with complaint of nasal congestion, post-nasal drainage, and a persistent dry cough. Onset abrupt, approximately 2 days ago. Overall fatigued with body aches. SOB: none. Wheezing: none. Fever: yes. Overall normal PO intake without n/v. Sick contacts: no. OTC treatment: cough medication without much help. Cough worse at night. Tylenol with mild help.  Social History   Tobacco Use  Smoking Status Former Smoker  . Types: Cigarettes  Smokeless Tobacco Never Used    ROS: As per HPI.   OBJECTIVE:  Vitals:   02/17/17 1425  BP: 140/86  Pulse: 96  Temp: (!) 101.9 F (38.8 C)  TempSrc: Oral  SpO2: 99%     General appearance: alert; appears fatigued HEENT: nasal congestion; clear runny nose; throat irritation secondary to post-nasal drainage Neck: supple without LAD Lungs: unlabored respirations, symmetrical air entry; cough: moderate; no respiratory distress Skin: warm and  dry Psychological: alert and cooperative; normal mood and affect   No Known Allergies  Past Medical History:  Diagnosis Date  . Fibroid   . Fibroid uterus 07/29/2013   Left subserosal 3.5 x 3.1 x 2.5 Left Intramural 1.2 x 1.3 x 1.4    . HPV (human papilloma virus) infection   . MDD (major depressive disorder)   . Migraines   . Pregnancy induced hypertension   . PTSD (post-traumatic stress disorder)    Family History  Problem Relation Age of Onset  . Hypertension Father   . Cancer Paternal Grandmother        breast cancer  . Hypertension Paternal Grandmother   . Breast cancer Paternal Grandmother    Social History   Socioeconomic History  . Marital status: Single    Spouse name: Not on file  . Number of children: Not on file  . Years of education: Not on file  . Highest education level: Not on file  Social Needs  . Financial resource strain: Not on file  . Food insecurity - worry: Not on file  . Food insecurity - inability: Not on file  . Transportation needs - medical: Not on file  . Transportation needs - non-medical: Not on file  Occupational History  . Not on file  Tobacco Use  . Smoking status: Former Smoker    Types: Cigarettes  . Smokeless tobacco: Never Used  Substance and Sexual Activity  . Alcohol use: No  . Drug use: No  . Sexual activity: Not on file  Other  Topics Concern  . Not on file  Social History Narrative  . Not on file           Vanessa Kick, MD 02/24/17 [phone]

## 2017-03-11 ENCOUNTER — Encounter: Payer: Self-pay | Admitting: Nurse Practitioner

## 2017-03-15 ENCOUNTER — Encounter: Payer: Self-pay | Admitting: Nurse Practitioner

## 2017-03-15 ENCOUNTER — Ambulatory Visit: Payer: Federal, State, Local not specified - PPO | Admitting: Nurse Practitioner

## 2017-03-17 ENCOUNTER — Ambulatory Visit: Payer: Federal, State, Local not specified - PPO | Admitting: Nurse Practitioner

## 2017-03-17 ENCOUNTER — Other Ambulatory Visit (HOSPITAL_COMMUNITY)
Admission: RE | Admit: 2017-03-17 | Discharge: 2017-03-17 | Disposition: A | Discharge status: Home / Self Care | Payer: Federal, State, Local not specified - PPO | Source: Ambulatory Visit | Attending: Nurse Practitioner | Admitting: Nurse Practitioner

## 2017-03-17 ENCOUNTER — Encounter: Payer: Self-pay | Admitting: Nurse Practitioner

## 2017-03-17 VITALS — BP 110/60 | HR 79 | Temp 98.2°F | Ht 61.0 in | Wt 180.0 lb

## 2017-03-17 DIAGNOSIS — F339 Major depressive disorder, recurrent, unspecified: Secondary | ICD-10-CM

## 2017-03-17 DIAGNOSIS — Z7251 High risk heterosexual behavior: Secondary | ICD-10-CM

## 2017-03-17 DIAGNOSIS — R0982 Postnasal drip: Secondary | ICD-10-CM

## 2017-03-17 DIAGNOSIS — R5382 Chronic fatigue, unspecified: Secondary | ICD-10-CM

## 2017-03-17 DIAGNOSIS — D500 Iron deficiency anemia secondary to blood loss (chronic): Secondary | ICD-10-CM | POA: Diagnosis not present

## 2017-03-17 DIAGNOSIS — L304 Erythema intertrigo: Secondary | ICD-10-CM | POA: Diagnosis not present

## 2017-03-17 DIAGNOSIS — I10 Essential (primary) hypertension: Secondary | ICD-10-CM | POA: Insufficient documentation

## 2017-03-17 DIAGNOSIS — K219 Gastro-esophageal reflux disease without esophagitis: Secondary | ICD-10-CM | POA: Diagnosis not present

## 2017-03-17 DIAGNOSIS — J309 Allergic rhinitis, unspecified: Secondary | ICD-10-CM | POA: Diagnosis not present

## 2017-03-17 DIAGNOSIS — D649 Anemia, unspecified: Secondary | ICD-10-CM | POA: Insufficient documentation

## 2017-03-17 MED ORDER — FLUOXETINE HCL 10 MG PO TABS: 10.0000 mg | ORAL_TABLET | Freq: Every day | ORAL | 1 refills | Status: DC

## 2017-03-17 MED ORDER — MOMETASONE FUROATE 50 MCG/ACT NA SUSP: 2.0000 | Freq: Every day | NASAL | 12 refills | Status: DC

## 2017-03-17 MED ORDER — PANTOPRAZOLE SODIUM 20 MG PO TBEC: 20.0000 mg | DELAYED_RELEASE_TABLET | Freq: Every day | ORAL | 1 refills | Status: DC

## 2017-03-17 MED ORDER — CLOTRIMAZOLE 1 % EX CREA: 1.0000 "application " | TOPICAL_CREAM | Freq: Two times a day (BID) | CUTANEOUS | 1 refills | Status: DC

## 2017-03-17 MED ORDER — CETIRIZINE HCL 10 MG PO TABS: 10.0000 mg | ORAL_TABLET | Freq: Every day | ORAL | 0 refills | Status: DC

## 2017-03-17 MED ORDER — RANITIDINE HCL 150 MG PO TABS: 150.0000 mg | ORAL_TABLET | Freq: Every day | ORAL | 1 refills | Status: DC

## 2017-03-17 MED ORDER — AMLODIPINE BESYLATE 5 MG PO TABS: 5.0000 mg | ORAL_TABLET | Freq: Every day | ORAL | 1 refills | Status: DC

## 2017-03-17 MED ORDER — HYDROCHLOROTHIAZIDE 25 MG PO TABS: 25.0000 mg | ORAL_TABLET | Freq: Every day | ORAL | 1 refills | Status: DC

## 2017-03-17 MED ORDER — POTASSIUM CHLORIDE CRYS ER 20 MEQ PO TBCR: 20.0000 meq | EXTENDED_RELEASE_TABLET | Freq: Every day | ORAL | 1 refills | Status: DC

## 2017-03-17 NOTE — Patient Instructions (Addendum)
Go to lab for blood draw and urine collection.  Your FMLA paper will be completed for 1day per month x 51months.  Check BP at home and record.  3x/week. Bring BP reading to next office visit.

## 2017-03-17 NOTE — Progress Notes (Signed)
Subjective:  Patient ID: Katrina Garcia, female    DOB: 03-Jan-1986  Age: 32 y.o. MRN: [id number]  CC: Abdominal Pain (pain has somewhat subsided.) and Medication Refill (discuss the cream that was given at last visit/ needs refills:protonix,rantidine?)  HPI  HTN: Controlled with current use of amlodipine and HCTZ. Reports she is complaint with BP medications but has waxing and waning BP readings at work. Reports BP reading 170/100 with headache and dizziness. Has 2 episodes at work. She will like FLMA form that enable her to go home is this happens. Reports adequate oral hydration. Maintains low sodium diet. BP Readings from Last 3 Encounters:  03/17/17 110/60  02/17/17 140/86  02/07/17 133/88   FHx of sleep apnea (father, mother and uncles) She snores, observed periods of not breathing when asleep by her significant other, has daytime fatigue, no somnolence.  She is also requesting STD test. Report malfunction of condom with new sexual partner.  Outpatient Medications Prior to Visit  Medication Sig Dispense Refill  . ibuprofen (ADVIL,MOTRIN) 800 MG tablet Take 1 tablet (800 mg total) by mouth every 8 (eight) hours as needed. With food 30 tablet 0  . amLODipine (NORVASC) 5 MG tablet Take 1 tablet (5 mg total) daily by mouth. 30 tablet 3  . cetirizine (ZYRTEC) 10 MG tablet Take 1 tablet (10 mg total) by mouth daily. 30 tablet 0  . dextromethorphan-guaiFENesin (MUCINEX DM) 30-600 MG 12hr tablet Take 1 tablet by mouth 2 (two) times daily as needed for cough. 14 tablet 0  . FLUoxetine (PROZAC) 10 MG tablet Take 1 tablet (10 mg total) daily by mouth. 30 tablet 3  . fluticasone (FLONASE) 50 MCG/ACT nasal spray Place 2 sprays into both nostrils daily. 16 g 0  . hydrochlorothiazide (HYDRODIURIL) 25 MG tablet Take 1 tablet (25 mg total) by mouth daily. 90 tablet 1  . HYDROcodone-homatropine (HYCODAN) 5-1.5 MG/5ML syrup Take 5 mLs by mouth every 6 (six) hours as needed for cough. 90 mL 0  .  ondansetron (ZOFRAN) 4 MG tablet Take 1 tablet (4 mg total) by mouth every 8 (eight) hours as needed for nausea or vomiting. 12 tablet 0  . oxymetazoline (AFRIN NASAL SPRAY) 0.05 % nasal spray Place 1 spray into both nostrils 2 (two) times daily. Use only for 3days, then stop 30 mL 0  . pantoprazole (PROTONIX) 40 MG tablet Take 1 tablet (40 mg total) by mouth daily. 90 tablet 1  . potassium chloride SA (K-DUR,KLOR-CON) 20 MEQ tablet Take 1 tablet (20 mEq total) by mouth daily. 90 tablet 1  . ranitidine (ZANTAC) 300 MG tablet Take 1 tablet (300 mg total) by mouth at bedtime. 90 tablet 1  . cyclobenzaprine (FLEXERIL) 10 MG tablet     . ferrous sulfate 325 (65 FE) MG EC tablet Take 1 tablet (325 mg total) by mouth 3 (three) times daily with meals. (Patient not taking: Reported on 03/17/2017) 90 tablet 3  . albuterol (PROAIR HFA) 108 (90 Base) MCG/ACT inhaler Inhale 1-2 puffs into the lungs every 6 (six) hours as needed for wheezing or shortness of breath. (Patient not taking: Reported on 03/17/2017) 1 Inhaler 0  . butalbital-acetaminophen-caffeine (FIORICET, ESGIC) 50-325-40 MG tablet Take 1 tablet by mouth every 6 (six) hours as needed for headache. (Patient not taking: Reported on 03/17/2017) 20 tablet 0  . cefUROXime (CEFTIN) 500 MG tablet Take 1 tablet (500 mg total) by mouth 2 (two) times daily with a meal. (Patient not taking: Reported on 03/17/2017) 14 tablet 0  No facility-administered medications prior to visit.     ROS See HPI  Objective:  BP 110/60   Pulse 79   Temp 98.2 F (36.8 C)   Ht 5\' 1"  (1.549 m)   Wt 180 lb (81.6 kg)   SpO2 98%   BMI 34.01 kg/m   BP Readings from Last 3 Encounters:  03/17/17 110/60  02/17/17 140/86  02/07/17 133/88    Wt Readings from Last 3 Encounters:  03/17/17 180 lb (81.6 kg)  12/11/16 177 lb (80.3 kg)  11/18/16 186 lb (84.4 kg)    Physical Exam  Constitutional: She is oriented to person, place, and time. No distress.  Neck: Normal range of  motion. Neck supple. No JVD present.  Cardiovascular: Normal rate, regular rhythm and normal heart sounds.  No murmur heard. Pulmonary/Chest: Effort normal and breath sounds normal.  Musculoskeletal: She exhibits no edema.  Lymphadenopathy:    She has no cervical adenopathy.  Neurological: She is alert and oriented to person, place, and time.  Skin: Skin is warm and dry.  Vitals reviewed.   Lab Results  Component Value Date   WBC 7.1 03/17/2017   HGB 11.4 (L) 03/17/2017   HCT 34.6 (L) 03/17/2017   PLT 351.0 03/17/2017   GLUCOSE 91 03/17/2017   CHOL 125 05/04/2016   TRIG 77.0 05/04/2016   HDL 45.80 05/04/2016   LDLCALC 64 05/04/2016   ALT 10 03/17/2017   AST 13 03/17/2017   NA 139 03/17/2017   K 3.9 03/17/2017   CL 104 03/17/2017   CREATININE 0.72 03/17/2017   BUN 13 03/17/2017   CO2 26 03/17/2017   TSH 1.53 03/17/2017   HGBA1C 6.2 03/17/2017   Assessment & Plan:   Arita was seen today for abdominal pain and medication refill.  Diagnoses and all orders for this visit:  Chronic fatigue -     Ambulatory referral to Neurology -     Hemoglobin A1c -     TSH -     CBC  Unprotected sexual intercourse -     Urine cytology ancillary only -     HIV antibody (with reflex)  Intertrigo -     clotrimazole (LOTRIMIN) 1 % cream; Apply 1 application topically 2 (two) times daily.  Essential hypertension -     amLODipine (NORVASC) 5 MG tablet; Take 1 tablet (5 mg total) by mouth daily. -     hydrochlorothiazide (HYDRODIURIL) 25 MG tablet; Take 1 tablet (25 mg total) by mouth daily. -     potassium chloride SA (K-DUR,KLOR-CON) 20 MEQ tablet; Take 1 tablet (20 mEq total) by mouth daily. -     Comprehensive metabolic panel  Allergic rhinitis with postnasal drip -     mometasone (NASONEX) 50 MCG/ACT nasal spray; Place 2 sprays into the nose daily. -     cetirizine (ZYRTEC) 10 MG tablet; Take 1 tablet (10 mg total) by mouth daily.  Episode of recurrent major depressive  disorder, unspecified depression episode severity (HCC) -     FLUoxetine (PROZAC) 10 MG tablet; Take 1 tablet (10 mg total) by mouth daily.  Gastroesophageal reflux disease without esophagitis -     pantoprazole (PROTONIX) 20 MG tablet; Take 1 tablet (20 mg total) by mouth daily. -     ranitidine (ZANTAC) 150 MG tablet; Take 1 tablet (150 mg total) by mouth at bedtime.  Iron deficiency anemia due to chronic blood loss -     CBC -     IBC panel  I have discontinued Kalia Estupinan's butalbital-acetaminophen-caffeine, albuterol, fluticasone, oxymetazoline, dextromethorphan-guaiFENesin, cefUROXime, ondansetron, and HYDROcodone-homatropine. I have also changed her amLODipine, FLUoxetine, pantoprazole, and ranitidine. Additionally, I am having her start on clotrimazole and mometasone. Lastly, I am having her maintain her cyclobenzaprine, ferrous sulfate, ibuprofen, cetirizine, hydrochlorothiazide, and potassium chloride SA.  Meds ordered this encounter  Medications  . clotrimazole (LOTRIMIN) 1 % cream    Sig: Apply 1 application topically 2 (two) times daily.    Dispense:  60 g    Refill:  1    Order Specific Question:   Supervising Provider    Answer:   Lucille Passy [[id number]]  . mometasone (NASONEX) 50 MCG/ACT nasal spray    Sig: Place 2 sprays into the nose daily.    Dispense:  17 g    Refill:  12    Order Specific Question:   Supervising Provider    Answer:   Lucille Passy [[id number]]  . amLODipine (NORVASC) 5 MG tablet    Sig: Take 1 tablet (5 mg total) by mouth daily.    Dispense:  90 tablet    Refill:  1    Order Specific Question:   Supervising Provider    Answer:   Lucille Passy [[id number]]  . cetirizine (ZYRTEC) 10 MG tablet    Sig: Take 1 tablet (10 mg total) by mouth daily.    Dispense:  30 tablet    Refill:  0    Order Specific Question:   Supervising Provider    Answer:   Lucille Passy [[id number]]  . FLUoxetine (PROZAC) 10 MG tablet    Sig: Take 1 tablet (10 mg total) by mouth daily.     Dispense:  90 tablet    Refill:  1    Order Specific Question:   Supervising Provider    Answer:   Lucille Passy [[id number]]  . hydrochlorothiazide (HYDRODIURIL) 25 MG tablet    Sig: Take 1 tablet (25 mg total) by mouth daily.    Dispense:  90 tablet    Refill:  1    Order Specific Question:   Supervising Provider    Answer:   Lucille Passy [[id number]]  . pantoprazole (PROTONIX) 20 MG tablet    Sig: Take 1 tablet (20 mg total) by mouth daily.    Dispense:  90 tablet    Refill:  1    Order Specific Question:   Supervising Provider    Answer:   Lucille Passy [[id number]]  . potassium chloride SA (K-DUR,KLOR-CON) 20 MEQ tablet    Sig: Take 1 tablet (20 mEq total) by mouth daily.    Dispense:  90 tablet    Refill:  1    Order Specific Question:   Supervising Provider    Answer:   Lucille Passy [[id number]]  . ranitidine (ZANTAC) 150 MG tablet    Sig: Take 1 tablet (150 mg total) by mouth at bedtime.    Dispense:  90 tablet    Refill:  1    Order Specific Question:   Supervising Provider    Answer:   Lucille Passy [[id number]]     Follow-up: Return in about 4 weeks (around 04/14/2017) for CPE (fasting) and HTN re eval.  Wilfred Lacy, NP

## 2017-03-18 LAB — COMPREHENSIVE METABOLIC PANEL
ALT: 10 U/L (ref 0–35)
AST: 13 U/L (ref 0–37)
Albumin: 4.4 g/dL (ref 3.5–5.2)
Alkaline Phosphatase: 60 U/L (ref 39–117)
BUN: 13 mg/dL (ref 6–23)
CO2: 26 meq/L (ref 19–32)
CREATININE: 0.72 mg/dL (ref 0.40–1.20)
Calcium: 9.5 mg/dL (ref 8.4–10.5)
Chloride: 104 mEq/L (ref 96–112)
GFR: 101.44 mL/min (ref >60.00)
GLUCOSE: 91 mg/dL (ref 70–99)
Potassium: 3.9 mEq/L (ref 3.5–5.1)
SODIUM: 139 meq/L (ref 135–145)
Total Bilirubin: 0.3 mg/dL (ref 0.2–1.2)
Total Protein: 7.1 g/dL (ref 6.0–8.3)

## 2017-03-18 LAB — IBC PANEL
Iron: 43 ug/dL (ref 42–145)
Saturation Ratios: 9.8 % — ABNORMAL LOW (ref 20.0–50.0)
Transferrin: 313 mg/dL (ref 212.0–360.0)

## 2017-03-18 LAB — URINE CYTOLOGY ANCILLARY ONLY
CHLAMYDIA, DNA PROBE: NEGATIVE
NEISSERIA GONORRHEA: NEGATIVE
TRICH (WINDOWPATH): NEGATIVE

## 2017-03-18 LAB — HEMOGLOBIN A1C: HEMOGLOBIN A1C: 6.2 % (ref 4.6–6.5)

## 2017-03-18 LAB — CBC
HEMATOCRIT: 34.6 % — AB (ref 36.0–46.0)
HEMOGLOBIN: 11.4 g/dL — AB (ref 12.0–15.0)
MCHC: 33 g/dL (ref 30.0–36.0)
MCV: 77.4 fl — AB (ref 78.0–100.0)
Platelets: 351 10*3/uL (ref 150.0–400.0)
RBC: 4.47 Mil/uL (ref 3.87–5.11)
RDW: 15.5 % (ref 11.5–15.5)
WBC: 7.1 10*3/uL (ref 4.0–10.5)

## 2017-03-18 LAB — TSH: TSH: 1.53 u[IU]/mL (ref 0.35–4.50)

## 2017-03-18 LAB — HIV ANTIBODY (ROUTINE TESTING W REFLEX): HIV: NONREACTIVE

## 2017-03-19 ENCOUNTER — Encounter: Payer: Self-pay | Admitting: Nurse Practitioner

## 2017-03-25 ENCOUNTER — Encounter: Payer: Self-pay | Admitting: Nurse Practitioner

## 2017-04-14 ENCOUNTER — Encounter: Payer: Self-pay | Admitting: Nurse Practitioner

## 2017-04-14 ENCOUNTER — Ambulatory Visit (INDEPENDENT_AMBULATORY_CARE_PROVIDER_SITE_OTHER): Payer: Federal, State, Local not specified - PPO | Admitting: Nurse Practitioner

## 2017-04-14 ENCOUNTER — Encounter: Payer: Federal, State, Local not specified - PPO | Admitting: Nurse Practitioner

## 2017-04-14 VITALS — BP 110/84 | HR 62 | Temp 97.8°F | Ht 61.0 in | Wt 179.2 lb

## 2017-04-14 DIAGNOSIS — F339 Major depressive disorder, recurrent, unspecified: Secondary | ICD-10-CM

## 2017-04-14 DIAGNOSIS — K219 Gastro-esophageal reflux disease without esophagitis: Secondary | ICD-10-CM

## 2017-04-14 DIAGNOSIS — I10 Essential (primary) hypertension: Secondary | ICD-10-CM | POA: Diagnosis not present

## 2017-04-14 DIAGNOSIS — R5382 Chronic fatigue, unspecified: Secondary | ICD-10-CM | POA: Insufficient documentation

## 2017-04-14 DIAGNOSIS — Z0000 Encounter for general adult medical examination without abnormal findings: Secondary | ICD-10-CM | POA: Diagnosis not present

## 2017-04-14 DIAGNOSIS — D500 Iron deficiency anemia secondary to blood loss (chronic): Secondary | ICD-10-CM | POA: Diagnosis not present

## 2017-04-14 MED ORDER — PANTOPRAZOLE SODIUM 20 MG PO TBEC: 20.0000 mg | DELAYED_RELEASE_TABLET | Freq: Every day | ORAL | 3 refills | Status: DC

## 2017-04-14 MED ORDER — AMLODIPINE BESYLATE 5 MG PO TABS: 5.0000 mg | ORAL_TABLET | Freq: Every day | ORAL | 3 refills | Status: DC

## 2017-04-14 MED ORDER — HYDROCHLOROTHIAZIDE 25 MG PO TABS: 25.0000 mg | ORAL_TABLET | Freq: Every day | ORAL | 3 refills | Status: DC

## 2017-04-14 MED ORDER — POTASSIUM CHLORIDE CRYS ER 20 MEQ PO TBCR: 20.0000 meq | EXTENDED_RELEASE_TABLET | Freq: Every day | ORAL | 3 refills | Status: DC

## 2017-04-14 MED ORDER — FLUOXETINE HCL 10 MG PO TABS: 10.0000 mg | ORAL_TABLET | Freq: Every day | ORAL | 3 refills | Status: DC

## 2017-04-14 NOTE — Patient Instructions (Addendum)
Please contact [facility] to schedule sleep study [place]: [address] [place], [place] [address]  Phone: [phone].  Health Maintenance, Female Adopting a healthy lifestyle and getting preventive care can go a long way to promote health and wellness. Talk with your health care provider about what schedule of regular examinations is right for you. This is a good chance for you to check in with your provider about disease prevention and staying healthy. In between checkups, there are plenty of things you can do on your own. Experts have done a lot of research about which lifestyle changes and preventive measures are most likely to keep you healthy. Ask your health care provider for more information. Weight and diet Eat a healthy diet  Be sure to include plenty of vegetables, fruits, low-fat dairy products, and lean protein.  Do not eat a lot of foods high in solid fats, added sugars, or salt.  Get regular exercise. This is one of the most important things you can do for your health. ? Most adults should exercise for at least 150 minutes each week. The exercise should increase your heart rate and make you sweat (moderate-intensity exercise). ? Most adults should also do strengthening exercises at least twice a week. This is in addition to the moderate-intensity exercise.  Maintain a healthy weight  Body mass index (BMI) is a measurement that can be used to identify possible weight problems. It estimates body fat based on height and weight. Your health care provider can help determine your BMI and help you achieve or maintain a healthy weight.  For females 45 years of age and older: ? A BMI below 18.5 is considered underweight. ? A BMI of 18.5 to 24.9 is normal. ? A BMI of 25 to 29.9 is considered overweight. ? A BMI of 30 and above is considered obese.  Watch levels of cholesterol and blood lipids  You should start having your blood tested for lipids and cholesterol  at 30 years of age, then have this test every 5 years.  You may need to have your cholesterol levels checked more often if: ? Your lipid or cholesterol levels are high. ? You are older than 30 years of age. ? You are at high risk for heart disease.  Cancer screening Lung Cancer  Lung cancer screening is recommended for adults 82-81 years old who are at high risk for lung cancer because of a history of smoking.  A yearly low-dose CT scan of the lungs is recommended for people who: ? Currently smoke. ? Have quit within the past 15 years. ? Have at least a 30-pack-year history of smoking. A pack year is smoking an average of one pack of cigarettes a day for 1 year.  Yearly screening should continue until it has been 15 years since you quit.  Yearly screening should stop if you develop a health problem that would prevent you from having lung cancer treatment.  Breast Cancer  Practice breast self-awareness. This means understanding how your breasts normally appear and feel.  It also means doing regular breast self-exams. Let your health care provider know about any changes, no matter how small.  If you are in your 20s or 30s, you should have a clinical breast exam (CBE) by a health care provider every 1-3 years as part of a regular health exam.  If you are 4 or older, have a CBE every year. Also consider having a breast X-ray (mammogram) every year.  If you have  have a family history of breast cancer, talk to your health care provider about genetic screening.  If you are at high risk for breast cancer, talk to your health care provider about having an MRI and a mammogram every year.  Breast cancer gene (BRCA) assessment is recommended for women who have family members with BRCA-related cancers. BRCA-related cancers include: ? Breast. ? Ovarian. ? Tubal. ? Peritoneal cancers.  Results of the assessment will determine the need for genetic counseling and BRCA1 and BRCA2  testing.  Cervical Cancer Your health care provider may recommend that you be screened regularly for cancer of the pelvic organs (ovaries, uterus, and vagina). This screening involves a pelvic examination, including checking for microscopic changes to the surface of your cervix (Pap test). You may be encouraged to have this screening done every 3 years, beginning at age 21.  For women ages 30-65, health care providers may recommend pelvic exams and Pap testing every 3 years, or they may recommend the Pap and pelvic exam, combined with testing for human papilloma virus (HPV), every 5 years. Some types of HPV increase your risk of cervical cancer. Testing for HPV may also be done on women of any age with unclear Pap test results.  Other health care providers may not recommend any screening for nonpregnant women who are considered low risk for pelvic cancer and who do not have symptoms. Ask your health care provider if a screening pelvic exam is right for you.  If you have had past treatment for cervical cancer or a condition that could lead to cancer, you need Pap tests and screening for cancer for at least 20 years after your treatment. If Pap tests have been discontinued, your risk factors (such as having a new sexual partner) need to be reassessed to determine if screening should resume. Some women have medical problems that increase the chance of getting cervical cancer. In these cases, your health care provider may recommend more frequent screening and Pap tests.  Colorectal Cancer  This type of cancer can be detected and often prevented.  Routine colorectal cancer screening usually begins at 30 years of age and continues through 30 years of age.  Your health care provider may recommend screening at an earlier age if you have risk factors for colon cancer.  Your health care provider may also recommend using home test kits to check for hidden blood in the stool.  A small camera at the end of a  tube can be used to examine your colon directly (sigmoidoscopy or colonoscopy). This is done to check for the earliest forms of colorectal cancer.  Routine screening usually begins at age 50.  Direct examination of the colon should be repeated every 5-10 years through 30 years of age. However, you may need to be screened more often if early forms of precancerous polyps or small growths are found.  Skin Cancer  Check your skin from head to toe regularly.  Tell your health care provider about any new moles or changes in moles, especially if there is a change in a mole's shape or color.  Also tell your health care provider if you have a mole that is larger than the size of a pencil eraser.  Always use sunscreen. Apply sunscreen liberally and repeatedly throughout the day.  Protect yourself by wearing long sleeves, pants, a wide-brimmed hat, and sunglasses whenever you are outside.  Heart disease, diabetes, and high blood pressure  High blood pressure causes heart disease and increases the   risk of stroke. High blood pressure is more likely to develop in: ? People who have blood pressure in the high end of the normal range (130-139/85-89 mm Hg). ? People who are overweight or obese. ? People who are African American.  If you are 18-39 years of age, have your blood pressure checked every 3-5 years. If you are 40 years of age or older, have your blood pressure checked every year. You should have your blood pressure measured twice-once when you are at a hospital or clinic, and once when you are not at a hospital or clinic. Record the average of the two measurements. To check your blood pressure when you are not at a hospital or clinic, you can use: ? An automated blood pressure machine at a pharmacy. ? A home blood pressure monitor.  If you are between 55 years and 79 years old, ask your health care provider if you should take aspirin to prevent strokes.  Have regular diabetes screenings. This  involves taking a blood sample to check your fasting blood sugar level. ? If you are at a normal weight and have a low risk for diabetes, have this test once every three years after 30 years of age. ? If you are overweight and have a high risk for diabetes, consider being tested at a younger age or more often. Preventing infection Hepatitis B  If you have a higher risk for hepatitis B, you should be screened for this virus. You are considered at high risk for hepatitis B if: ? You were born in a country where hepatitis B is common. Ask your health care provider which countries are considered high risk. ? Your parents were born in a high-risk country, and you have not been immunized against hepatitis B (hepatitis B vaccine). ? You have HIV or AIDS. ? You use needles to inject street drugs. ? You live with someone who has hepatitis B. ? You have had sex with someone who has hepatitis B. ? You get hemodialysis treatment. ? You take certain medicines for conditions, including cancer, organ transplantation, and autoimmune conditions.  Hepatitis C  Blood testing is recommended for: ? Everyone born from 1945 through 1965. ? Anyone with known risk factors for hepatitis C.  Sexually transmitted infections (STIs)  You should be screened for sexually transmitted infections (STIs) including gonorrhea and chlamydia if: ? You are sexually active and are younger than 30 years of age. ? You are older than 30 years of age and your health care provider tells you that you are at risk for this type of infection. ? Your sexual activity has changed since you were last screened and you are at an increased risk for chlamydia or gonorrhea. Ask your health care provider if you are at risk.  If you do not have HIV, but are at risk, it may be recommended that you take a prescription medicine daily to prevent HIV infection. This is called pre-exposure prophylaxis (PrEP). You are considered at risk if: ? You are  sexually active and do not regularly use condoms or know the HIV status of your partner(s). ? You take drugs by injection. ? You are sexually active with a partner who has HIV.  Talk with your health care provider about whether you are at high risk of being infected with HIV. If you choose to begin PrEP, you should first be tested for HIV. You should then be tested every 3 months for as long as you are taking PrEP. Pregnancy    If you are premenopausal and you may become pregnant, ask your health care provider about preconception counseling.  If you may become pregnant, take 400 to 800 micrograms (mcg) of folic acid every day.  If you want to prevent pregnancy, talk to your health care provider about birth control (contraception). Osteoporosis and menopause  Osteoporosis is a disease in which the bones lose minerals and strength with aging. This can result in serious bone fractures. Your risk for osteoporosis can be identified using a bone density scan.  If you are 47 years of age or older, or if you are at risk for osteoporosis and fractures, ask your health care provider if you should be screened.  Ask your health care provider whether you should take a calcium or vitamin D supplement to lower your risk for osteoporosis.  Menopause may have certain physical symptoms and risks.  Hormone replacement therapy may reduce some of these symptoms and risks. Talk to your health care provider about whether hormone replacement therapy is right for you. Follow these instructions at home:  Schedule regular health, dental, and eye exams.  Stay current with your immunizations.  Do not use any tobacco products including cigarettes, chewing tobacco, or electronic cigarettes.  If you are pregnant, do not drink alcohol.  If you are breastfeeding, limit how much and how often you drink alcohol.  Limit alcohol intake to no more than 1 drink per day for nonpregnant women. One drink equals 12 ounces of  beer, 5 ounces of wine, or 1 ounces of hard liquor.  Do not use street drugs.  Do not share needles.  Ask your health care provider for help if you need support or information about quitting drugs.  Tell your health care provider if you often feel depressed.  Tell your health care provider if you have ever been abused or do not feel safe at home. This information is not intended to replace advice given to you by your health care provider. Make sure you discuss any questions you have with your health care provider. Document Released: 07/07/2010 Document Revised: 05/30/2015 Document Reviewed: 09/25/2014 Elsevier Interactive Patient Education  [employer].

## 2017-04-14 NOTE — Assessment & Plan Note (Signed)
Improved mood with prozac

## 2017-04-14 NOTE — Progress Notes (Signed)
Subjective:    Patient ID: Katrina Garcia, female    DOB: May 27, 1985, 32 y.o.   MRN: [id number]  Patient presents today for complete physical   HPI HTN: Stable with amlodipine and HCTZ. Last BP reading at work 130/70. Does not check BP at home. 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  Depression: Stable with prozac.  Immunizations: (TDAP, Hep C screen, Pneumovax, Influenza, zoster)  Health Maintenance  Topic Date Due  . Flu Shot  08/05/2017  . Pap Smear  05/05/2019  . Tetanus Vaccine  11/02/2023  . HIV Screening  Completed   Diet:low fat and low sodium.  Weight:  Wt Readings from Last 3 Encounters:  04/14/17 179 lb 3.2 oz (81.3 kg)  03/17/17 180 lb (81.6 kg)  12/11/16 177 lb (80.3 kg)    Exercise:none.  Fall Risk: Fall Risk  11/04/2016 05/04/2016 11/22/2013  Falls in the past year? No No No   Home Safety:home with son.  Depression/Suicide: Depression screen [facility] [address] 2/9 04/14/2017 11/04/2016 05/04/2016 11/22/2013  Decreased Interest 1 3 0 0  Down, Depressed, Hopeless 0 2 0 0  PHQ - 2 Score 1 5 0 0  Altered sleeping 3 3 - -  Tired, decreased energy 3 3 - -  Change in appetite 3 3 - -  Feeling bad or failure about yourself  0 2 - -  Trouble concentrating 0 3 - -  Moving slowly or fidgety/restless 0 3 - -  Suicidal thoughts 0 0 - -  PHQ-9 Score 10 22 - -   Pap Smear (every 14yrs for >21-29 without HPV, every 76yrs for >30-80yrs with HPV):up to date.  Vision:up to date.  Dental:up to date.  Sexual History (birth control, marital status, STD):single, sexually active, use of condoms.  Medications and allergies reviewed with patient and updated if appropriate.  Patient Active Problem List   Diagnosis Date Noted  . Chronic fatigue 04/14/2017  . Anemia 03/17/2017  . Contraception management  01/22/2014  . Hypertension 01/22/2014  . Fibroid uterus 07/29/2013  . MDD (major depressive disorder) 08/12/2012  . PTSD (post-traumatic stress disorder) 08/12/2012    Current Outpatient Medications on File Prior to Visit  Medication Sig Dispense Refill  . clotrimazole (LOTRIMIN) 1 % cream Apply 1 application topically 2 (two) times daily. 60 g 1  . mometasone (NASONEX) 50 MCG/ACT nasal spray Place 2 sprays into the nose daily. 17 g 12  . cyclobenzaprine (FLEXERIL) 10 MG tablet      No current facility-administered medications on file prior to visit.     Past Medical History:  Diagnosis Date  . Fibroid   . Fibroid uterus 07/29/2013   Left subserosal 3.5 x 3.1 x 2.5 Left Intramural 1.2 x 1.3 x 1.4    . HPV (human papilloma virus) infection   . MDD (major depressive disorder)   . Migraines   . Pregnancy induced hypertension   . PTSD (post-traumatic stress disorder)     Past Surgical History:  Procedure Laterality Date  . CESAREAN SECTION N/A 12/18/2013   Procedure: CESAREAN SECTION;  Surgeon: Truett Mainland, DO;  Location: [facility];  Service: Obstetrics;  Laterality: N/A;  . WISDOM TOOTH EXTRACTION      Social History   Socioeconomic History  . Marital status: Single    Spouse name:  Not on file  . Number of children: Not on file  . Years of education: Not on file  . Highest education level: Not on file  Occupational History  . Not on file  Social Needs  . Financial resource strain: Not on file  . Food insecurity:    Worry: Not on file    Inability: Not on file  . Transportation needs:    Medical: Not on file    Non-medical: Not on file  Tobacco Use  . Smoking status: Former Smoker    Types: Cigarettes  . Smokeless tobacco: Never Used  Substance and Sexual Activity  . Alcohol use: No  . Drug use: No  . Sexual activity: Yes    Birth control/protection: None  Lifestyle  . Physical activity:    Days per week: Not on file    Minutes per session: Not on file  .  Stress: Not on file  Relationships  . Social connections:    Talks on phone: Not on file    Gets together: Not on file    Attends religious service: Not on file    Active member of club or organization: Not on file    Attends meetings of clubs or organizations: Not on file    Relationship status: Not on file  Other Topics Concern  . Not on file  Social History Narrative  . Not on file    Family History  Problem Relation Age of Onset  . Hypertension Father   . Cancer Paternal Grandmother        breast cancer  . Hypertension Paternal Grandmother   . Breast cancer Paternal Grandmother         ROS  Objective:   Vitals:   04/14/17 1051  BP: 110/84  Pulse: 62  Temp: 97.8 F (36.6 C)  SpO2: 98%    Body mass index is 33.86 kg/m.   Physical Examination:  Physical Exam  Constitutional: She is oriented to person, place, and time. She appears well-developed. No distress.  HENT:  Right Ear: External ear normal.  Left Ear: External ear normal.  Nose: Nose normal.  Mouth/Throat: Oropharynx is clear and moist. No oropharyngeal exudate.  Eyes: Pupils are equal, round, and reactive to light. Conjunctivae and EOM are normal.  Neck: Normal range of motion. Neck supple. No thyromegaly present.  Cardiovascular: Normal rate, regular rhythm and normal heart sounds.  Pulmonary/Chest: Effort normal and breath sounds normal. No respiratory distress. She exhibits no tenderness and no bony tenderness. Right breast exhibits no inverted nipple, no mass, no nipple discharge, no skin change and no tenderness. Left breast exhibits no inverted nipple, no mass, no nipple discharge, no skin change and no tenderness.  Abdominal: Soft. Bowel sounds are normal. She exhibits no distension.  Musculoskeletal: Normal range of motion. She exhibits no edema.  Lymphadenopathy:    She has no cervical adenopathy.  Neurological: She is alert and oriented to person, place, and time. She has normal reflexes.   Skin: Skin is warm and dry.  Psychiatric: She has a normal mood and affect. Her behavior is normal. Thought content normal.  Vitals reviewed.   ASSESSMENT and PLAN:  Katrina Garcia was seen today for annual exam.  Diagnoses and all orders for this visit:  Preventative health care  Essential hypertension -     amLODipine (NORVASC) 5 MG tablet; Take 1 tablet (5 mg total) by mouth daily. -     hydrochlorothiazide (HYDRODIURIL) 25 MG tablet; Take 1 tablet (25 mg  total) by mouth daily. -     potassium chloride SA (K-DUR,KLOR-CON) 20 MEQ tablet; Take 1 tablet (20 mEq total) by mouth daily.  Iron deficiency anemia due to chronic blood loss  Episode of recurrent major depressive disorder, unspecified depression episode severity (HCC) -     FLUoxetine (PROZAC) 10 MG tablet; Take 1 tablet (10 mg total) by mouth daily.  Gastroesophageal reflux disease without esophagitis -     pantoprazole (PROTONIX) 20 MG tablet; Take 1 tablet (20 mg total) by mouth daily.   Hypertension Improved with amlodipine and HCTZ. BP Readings from Last 3 Encounters:  04/14/17 110/84  03/17/17 110/60  02/17/17 140/86   Waxing and waning BP and chronic fatigue: Sleep study ordered  MDD (major depressive disorder) Improved mood with prozac     Follow up: Return in about 6 months (around 10/14/2017) for HTN and depression.  Wilfred Lacy, NP

## 2017-04-14 NOTE — Assessment & Plan Note (Signed)
Improved with amlodipine and HCTZ. BP Readings from Last 3 Encounters:  04/14/17 110/84  03/17/17 110/60  02/17/17 140/86   Waxing and waning BP and chronic fatigue: Sleep study ordered

## 2017-04-29 ENCOUNTER — Telehealth: Payer: Self-pay | Admitting: Nurse Practitioner

## 2017-04-29 NOTE — Telephone Encounter (Signed)
Spoke with pt and informed her she gave [place] the wrong paperwork to fill out. She stated she would return to office with the correct paperwork. TLG

## 2017-04-29 NOTE — Telephone Encounter (Signed)
Patient came into office and brought in correct paperwork that needs to be complete. Patient requested when forms are completed to contact her @ [phone]. Patient will come in and pick up forms. Forms have been placed in [facility]'s file for pick-up at the front.

## 2017-04-29 NOTE — Telephone Encounter (Signed)
Placed forms on [place]'s desk. TLG

## 2017-04-29 NOTE — Telephone Encounter (Signed)
Patient came in this morning and dropped off FMLA forms that need to be completed. Patient requested when forms are completed to contact her @ [phone]. Patient will come in and pick up forms. Forms have been placed in [facility]'s file for pick-up at the front.

## 2017-05-09 ENCOUNTER — Encounter: Payer: Self-pay | Admitting: Nurse Practitioner

## 2017-05-10 NOTE — Telephone Encounter (Signed)
Please advise, ok for anther Rx.

## 2017-05-13 ENCOUNTER — Encounter: Payer: Self-pay | Admitting: Nurse Practitioner

## 2017-06-29 ENCOUNTER — Encounter: Payer: Self-pay | Admitting: Nurse Practitioner

## 2017-07-07 ENCOUNTER — Ambulatory Visit: Payer: Federal, State, Local not specified - PPO | Admitting: Nurse Practitioner

## 2017-07-14 ENCOUNTER — Other Ambulatory Visit (HOSPITAL_COMMUNITY)
Admission: RE | Admit: 2017-07-14 | Discharge: 2017-07-14 | Disposition: A | Discharge status: Home / Self Care | Payer: Federal, State, Local not specified - PPO | Source: Ambulatory Visit | Attending: Nurse Practitioner | Admitting: Nurse Practitioner

## 2017-07-14 ENCOUNTER — Ambulatory Visit: Payer: Federal, State, Local not specified - PPO | Admitting: Nurse Practitioner

## 2017-07-14 ENCOUNTER — Encounter: Payer: Self-pay | Admitting: Nurse Practitioner

## 2017-07-14 VITALS — BP 110/76 | HR 65 | Temp 98.1°F | Ht 61.0 in | Wt 181.0 lb

## 2017-07-14 DIAGNOSIS — N760 Acute vaginitis: Secondary | ICD-10-CM | POA: Insufficient documentation

## 2017-07-14 DIAGNOSIS — R300 Dysuria: Secondary | ICD-10-CM

## 2017-07-14 DIAGNOSIS — Z30015 Encounter for initial prescription of vaginal ring hormonal contraceptive: Secondary | ICD-10-CM | POA: Diagnosis not present

## 2017-07-14 LAB — POCT URINE PREGNANCY: PREG TEST UR: NEGATIVE

## 2017-07-14 LAB — POCT URINALYSIS DIPSTICK
Blood, UA: NEGATIVE
Glucose, UA: NEGATIVE
LEUKOCYTES UA: NEGATIVE
Nitrite, UA: NEGATIVE
PROTEIN UA: POSITIVE — AB
Urobilinogen, UA: 0.2 E.U./dL
pH, UA: 6 (ref 5.0–8.0)

## 2017-07-14 MED ORDER — LEVONORGEST-ETH ESTRAD 91-DAY 0.15-0.03 &0.01 MG PO TABS
1.0000 | ORAL_TABLET | Freq: Every day | ORAL | 3 refills | Status: DC
Start: 2017-07-19 — End: 2017-07-14

## 2017-07-14 MED ORDER — METRONIDAZOLE 500 MG PO TABS: 500.0000 mg | ORAL_TABLET | Freq: Two times a day (BID) | ORAL | 0 refills | Status: DC

## 2017-07-14 MED ORDER — METRONIDAZOLE 0.75 % VA GEL: 1.0000 | Freq: Every day | VAGINAL | 0 refills | Status: DC

## 2017-07-14 MED ORDER — ETONOGESTREL-ETHINYL ESTRADIOL 0.12-0.015 MG/24HR VA RING: VAGINAL_RING | VAGINAL | 3 refills | Status: DC

## 2017-07-14 NOTE — Progress Notes (Signed)
Subjective:  Patient ID: Katrina Garcia, female    DOB: Jan 08, 1985  Age: 32 y.o. MRN: [id number]  CC: Vaginal Discharge (pt stated she has thick discharge,some odor,itchy at times,irritate when urinate and stomach cramps mostly. pt tried Azo yeast OTC/ and this has been going on for 2 wks. ) and Contraception (birth control consult?)  Vaginal Discharge  The patient's primary symptoms include genital itching, a genital odor and vaginal discharge. The patient's pertinent negatives include no genital lesions, genital rash, missed menses, pelvic pain or vaginal bleeding. This is a new problem. The current episode started in the past 7 days. The problem has been unchanged. The problem affects both sides. She is not pregnant. Associated symptoms include abdominal pain and dysuria. Pertinent negatives include no anorexia, back pain, chills, constipation, fever, flank pain, frequency, headaches, joint swelling, nausea, painful intercourse, rash or sore throat.    she will also like to resume contraception. Current tobacco use: 1/2ppd No Fhx of DVT or PE.   Reviewed [facility] today.  Outpatient Medications Prior to Visit  Medication Sig Dispense Refill  . amLODipine (NORVASC) 5 MG tablet Take 1 tablet (5 mg total) by mouth daily. 90 tablet 3  . clotrimazole (LOTRIMIN) 1 % cream Apply 1 application topically 2 (two) times daily. 60 g 1  . cyclobenzaprine (FLEXERIL) 10 MG tablet     . FLUoxetine (PROZAC) 10 MG tablet Take 1 tablet (10 mg total) by mouth daily. 90 tablet 3  . hydrochlorothiazide (HYDRODIURIL) 25 MG tablet Take 1 tablet (25 mg total) by mouth daily. 90 tablet 3  . mometasone (NASONEX) 50 MCG/ACT nasal spray Place 2 sprays into the nose daily. 17 g 12  . pantoprazole (PROTONIX) 20 MG tablet Take 1 tablet (20 mg total) by mouth daily. 90 tablet 3  . potassium chloride SA (K-DUR,KLOR-CON) 20 MEQ tablet Take 1 tablet (20 mEq total) by mouth daily. 90 tablet 3   No facility-administered medications  prior to visit.     ROS See HPI  Objective:  BP 110/76   Pulse 65   Temp 98.1 F (36.7 C) (Oral)   Ht 5\' 1"  (1.549 m)   Wt 181 lb (82.1 kg)   LMP 06/19/2017   SpO2 98%   BMI 34.20 kg/m   BP Readings from Last 3 Encounters:  07/14/17 110/76  04/14/17 110/84  03/17/17 110/60    Wt Readings from Last 3 Encounters:  07/14/17 181 lb (82.1 kg)  04/14/17 179 lb 3.2 oz (81.3 kg)  03/17/17 180 lb (81.6 kg)    Physical Exam  Cardiovascular: Normal rate.  Pulmonary/Chest: Effort normal.  Abdominal: Hernia confirmed negative in the right inguinal area and confirmed negative in the left inguinal area.  Genitourinary: Rectum normal. Pelvic exam was performed with patient supine. There is no rash or tenderness on the right labia. There is no rash or tenderness on the left labia. Cervix exhibits discharge. Cervix exhibits no motion tenderness and no friability. Right adnexum displays no mass and no tenderness. Left adnexum displays no mass and no tenderness. Vaginal discharge found.  Genitourinary Comments: Chaperone present. White thick discharge with fishy odor  Lymphadenopathy: No inguinal adenopathy noted on the right or left side.  Skin: No rash noted.  Vitals reviewed.   Lab Results  Component Value Date   WBC 7.1 03/17/2017   HGB 11.4 (L) 03/17/2017   HCT 34.6 (L) 03/17/2017   PLT 351.0 03/17/2017   GLUCOSE 91 03/17/2017   CHOL 125 05/04/2016  TRIG 77.0 05/04/2016   HDL 45.80 05/04/2016   LDLCALC 64 05/04/2016   ALT 10 03/17/2017   AST 13 03/17/2017   NA 139 03/17/2017   K 3.9 03/17/2017   CL 104 03/17/2017   CREATININE 0.72 03/17/2017   BUN 13 03/17/2017   CO2 26 03/17/2017   TSH 1.53 03/17/2017   HGBA1C 6.2 03/17/2017    Assessment & Plan:   Laurena was seen today for vaginal discharge and contraception.  Diagnoses and all orders for this visit:  Acute vaginitis -     Cervicovaginal ancillary only -     Discontinue: metroNIDAZOLE (METROGEL VAGINAL) 0.75  % vaginal gel; Place 1 Applicatorful vaginally at bedtime. -     metroNIDAZOLE (FLAGYL) 500 MG tablet; Take 1 tablet (500 mg total) by mouth 2 (two) times daily.  Dysuria -     POCT urinalysis dipstick -     Discontinue: metroNIDAZOLE (METROGEL VAGINAL) 0.75 % vaginal gel; Place 1 Applicatorful vaginally at bedtime.  Encounter for initial prescription of vaginal ring hormonal contraceptive -     POCT urine pregnancy -     Discontinue: Levonorgestrel-Ethinyl Estradiol (AMETHIA,CAMRESE) 0.15-0.03 &0.01 MG tablet; Take 1 tablet by mouth daily. -     etonogestrel-ethinyl estradiol (NUVARING) 0.12-0.015 MG/24HR vaginal ring; Insert vaginally and leave in place for 3 consecutive weeks, then remove for 1 week.   I have discontinued Ariyon Gardenhire's Levonorgestrel-Ethinyl Estradiol and metroNIDAZOLE. I am also having her start on etonogestrel-ethinyl estradiol and metroNIDAZOLE. Additionally, I am having her maintain her cyclobenzaprine, clotrimazole, mometasone, amLODipine, FLUoxetine, hydrochlorothiazide, pantoprazole, and potassium chloride SA.  Meds ordered this encounter  Medications  . DISCONTD: Levonorgestrel-Ethinyl Estradiol (AMETHIA,CAMRESE) 0.15-0.03 &0.01 MG tablet    Sig: Take 1 tablet by mouth daily.    Dispense:  1 Package    Refill:  3    Order Specific Question:   Supervising Provider    Answer:   Lucille Passy [[id number]]  . DISCONTD: metroNIDAZOLE (METROGEL VAGINAL) 0.75 % vaginal gel    Sig: Place 1 Applicatorful vaginally at bedtime.    Dispense:  70 g    Refill:  0    Order Specific Question:   Supervising Provider    Answer:   Lucille Passy [[id number]]  . etonogestrel-ethinyl estradiol (NUVARING) 0.12-0.015 MG/24HR vaginal ring    Sig: Insert vaginally and leave in place for 3 consecutive weeks, then remove for 1 week.    Dispense:  3 each    Refill:  3    Order Specific Question:   Supervising Provider    Answer:   Lucille Passy [[id number]]  . metroNIDAZOLE (FLAGYL) 500 MG tablet     Sig: Take 1 tablet (500 mg total) by mouth 2 (two) times daily.    Dispense:  14 tablet    Refill:  0    Do not dispense metrogel    Order Specific Question:   Supervising Provider    Answer:   Lucille Passy [[id number]]    Follow-up: No follow-ups on file.  Wilfred Lacy, NP

## 2017-07-14 NOTE — Patient Instructions (Addendum)
Advised about need to quit tobacco use while using contraception with estrogen.  Insert nuvaring at beginning of next menstrual cycle.  You will be called with wet prep results.  Ethinyl Estradiol; Etonogestrel vaginal ring What is this medicine? ETHINYL ESTRADIOL; ETONOGESTREL (ETH in il es tra DYE ole; et oh noe JES trel) vaginal ring is a flexible, vaginal ring used as a contraceptive (birth control method). This medicine combines two types of female hormones, an estrogen and a progestin. This ring is used to prevent ovulation and pregnancy. Each ring is effective for one month. This medicine may be used for other purposes; ask your health care provider or pharmacist if you have questions. COMMON BRAND NAME(S): NuvaRing What should I tell my health care provider before I take this medicine? They need to know if you have or ever had any of these conditions: -abnormal vaginal bleeding -blood vessel disease or blood clots -breast, cervical, endometrial, ovarian, liver, or uterine cancer -diabetes -gallbladder disease -heart disease or recent heart attack -high blood pressure -high cholesterol -kidney disease -liver disease -migraine headaches -stroke -systemic lupus erythematosus (SLE) -tobacco smoker -an unusual or allergic reaction to estrogens, progestins, other medicines, foods, dyes, or preservatives -pregnant or trying to get pregnant -breast-feeding How should I use this medicine? Insert the ring into your vagina as directed. Follow the directions on the prescription label. The ring will remain place for 3 weeks and is then removed for a 1-week break. A new ring is inserted 1 week after the last ring was removed, on the same day of the week. Check often to make sure the ring is still in place, especially before and after sexual intercourse. If the ring was out of the vagina for an unknown amount of time, you may not be protected from pregnancy. Perform a pregnancy test and call  your doctor. Do not use more often than directed. A patient package insert for the product will be given with each prescription and refill. Read this sheet carefully each time. The sheet may change frequently. Contact your pediatrician regarding the use of this medicine in children. Special care may be needed. This medicine has been used in female children who have started having menstrual periods. Overdosage: If you think you have taken too much of this medicine contact a poison control center or emergency room at once. NOTE: This medicine is only for you. Do not share this medicine with others. What if I miss a dose? You will need to replace your vaginal ring once a month as directed. If the ring should slip out, or if you leave it in longer or shorter than you should, contact your health care professional for advice. What may interact with this medicine? Do not take this medicine with the following medication: -dasabuvir; ombitasvir; paritaprevir; ritonavir -ombitasvir; paritaprevir; ritonavir This medicine may also interact with the following medications: -acetaminophen -antibiotics or medicines for infections, especially rifampin, rifabutin, rifapentine, and griseofulvin, and possibly penicillins or tetracyclines -aprepitant -ascorbic acid (vitamin C) -atorvastatin -barbiturate medicines, such as phenobarbital -bosentan -carbamazepine -caffeine -clofibrate -cyclosporine -dantrolene -doxercalciferol -felbamate -grapefruit juice -hydrocortisone -medicines for anxiety or sleeping problems, such as diazepam or temazepam -medicines for diabetes, including pioglitazone -modafinil -mycophenolate -nefazodone -oxcarbazepine -phenytoin -prednisolone -ritonavir or other medicines for HIV infection or AIDS -rosuvastatin -selegiline -soy isoflavones supplements -St. John's wort -tamoxifen or raloxifene -theophylline -thyroid hormones -topiramate -warfarin This list may not  describe all possible interactions. Give your health care provider a list of all the medicines, herbs, non-prescription drugs,  or dietary supplements you use. Also tell them if you smoke, drink alcohol, or use illegal drugs. Some items may interact with your medicine. What should I watch for while using this medicine? Visit your doctor or health care professional for regular checks on your progress. You will need a regular breast and pelvic exam and Pap smear while on this medicine. Use an additional method of contraception during the first cycle that you use this ring. Do not use a diaphragm or female condom, as the ring can interfere with these birth control methods and their proper placement. If you have any reason to think you are pregnant, stop using this medicine right away and contact your doctor or health care professional. If you are using this medicine for hormone related problems, it may take several cycles of use to see improvement in your condition. Smoking increases the risk of getting a blood clot or having a stroke while you are using hormonal birth control, especially if you are more than 30 years old. You are strongly advised not to smoke. This medicine can make your body retain fluid, making your fingers, hands, or ankles swell. Your blood pressure can go up. Contact your doctor or health care professional if you feel you are retaining fluid. This medicine can make you more sensitive to the sun. Keep out of the sun. If you cannot avoid being in the sun, wear protective clothing and use sunscreen. Do not use sun lamps or tanning beds/booths. If you wear contact lenses and notice visual changes, or if the lenses begin to feel uncomfortable, consult your eye care specialist. In some women, tenderness, swelling, or minor bleeding of the gums may occur. Notify your dentist if this happens. Brushing and flossing your teeth regularly may help limit this. See your dentist regularly and inform  your dentist of the medicines you are taking. If you are going to have elective surgery, you may need to stop using this medicine before the surgery. Consult your health care professional for advice. This medicine does not protect you against HIV infection (AIDS) or any other sexually transmitted diseases. What side effects may I notice from receiving this medicine? Side effects that you should report to your doctor or health care professional as soon as possible: -breast tissue changes or discharge -changes in vaginal bleeding during your period or between your periods -chest pain -coughing up blood -dizziness or fainting spells -headaches or migraines -leg, arm or groin pain -severe or sudden headaches -stomach pain (severe) -sudden shortness of breath -sudden loss of coordination, especially on one side of the body -speech problems -symptoms of vaginal infection like itching, irritation or unusual discharge -tenderness in the upper abdomen -vomiting -weakness or numbness in the arms or legs, especially on one side of the body -yellowing of the eyes or skin Side effects that usually do not require medical attention (report to your doctor or health care professional if they continue or are bothersome): -breakthrough bleeding and spotting that continues beyond the 3 initial cycles of pills -breast tenderness -mood changes, anxiety, depression, frustration, anger, or emotional outbursts -increased sensitivity to sun or ultraviolet light -nausea -skin rash, acne, or brown spots on the skin -weight gain (slight) This list may not describe all possible side effects. Call your doctor for medical advice about side effects. You may report side effects to FDA at 1-800-FDA-1088. Where should I keep my medicine? Keep out of the reach of children. Store at room temperature between 15 and 30 degrees C (  59 and 86 degrees F) for up to 4 months. The product will expire after 4 months. Protect from  light. Throw away any unused medicine after the expiration date. NOTE: This sheet is a summary. It may not cover all possible information. If you have questions about this medicine, talk to your doctor, pharmacist, or health care provider.  2018 [employer]vier/Gold Standard (2015-08-30 17:00:31)

## 2017-07-16 LAB — CERVICOVAGINAL ANCILLARY ONLY
BACTERIAL VAGINITIS: POSITIVE — AB
CANDIDA VAGINITIS: NEGATIVE
Chlamydia: NEGATIVE
Neisseria Gonorrhea: NEGATIVE
Trichomonas: NEGATIVE

## 2017-09-01 ENCOUNTER — Encounter: Payer: Self-pay | Admitting: Nurse Practitioner

## 2017-09-14 ENCOUNTER — Other Ambulatory Visit: Payer: Self-pay | Admitting: Nurse Practitioner

## 2017-09-14 DIAGNOSIS — J0140 Acute pansinusitis, unspecified: Secondary | ICD-10-CM

## 2017-09-14 DIAGNOSIS — J029 Acute pharyngitis, unspecified: Secondary | ICD-10-CM

## 2017-09-17 ENCOUNTER — Emergency Department (HOSPITAL_COMMUNITY)
Admission: EM | Admit: 2017-09-17 | Discharge: 2017-09-18 | Discharge status: Against Medical Advice | Payer: Federal, State, Local not specified - PPO | Attending: Emergency Medicine | Admitting: Emergency Medicine

## 2017-09-17 ENCOUNTER — Other Ambulatory Visit: Payer: Self-pay

## 2017-09-17 ENCOUNTER — Encounter (HOSPITAL_COMMUNITY): Payer: Self-pay

## 2017-09-17 DIAGNOSIS — R110 Nausea: Secondary | ICD-10-CM | POA: Diagnosis not present

## 2017-09-17 DIAGNOSIS — R079 Chest pain, unspecified: Secondary | ICD-10-CM | POA: Diagnosis not present

## 2017-09-17 DIAGNOSIS — I10 Essential (primary) hypertension: Secondary | ICD-10-CM | POA: Diagnosis not present

## 2017-09-17 DIAGNOSIS — R0789 Other chest pain: Secondary | ICD-10-CM | POA: Diagnosis not present

## 2017-09-17 DIAGNOSIS — F4310 Post-traumatic stress disorder, unspecified: Secondary | ICD-10-CM | POA: Diagnosis not present

## 2017-09-17 DIAGNOSIS — Z5321 Procedure and treatment not carried out due to patient leaving prior to being seen by health care provider: Secondary | ICD-10-CM | POA: Diagnosis not present

## 2017-09-17 DIAGNOSIS — F329 Major depressive disorder, single episode, unspecified: Secondary | ICD-10-CM | POA: Diagnosis not present

## 2017-09-17 DIAGNOSIS — R7989 Other specified abnormal findings of blood chemistry: Secondary | ICD-10-CM | POA: Diagnosis not present

## 2017-09-17 DIAGNOSIS — Z79899 Other long term (current) drug therapy: Secondary | ICD-10-CM | POA: Diagnosis not present

## 2017-09-17 DIAGNOSIS — R51 Headache: Secondary | ICD-10-CM | POA: Diagnosis not present

## 2017-09-17 DIAGNOSIS — K219 Gastro-esophageal reflux disease without esophagitis: Secondary | ICD-10-CM | POA: Diagnosis not present

## 2017-09-17 NOTE — ED Triage Notes (Signed)
Pt presents to ED from home for HTN and chest pain. Pt reports that her BP has been elevated all week, systolic around 761. Pt reports that she has been having occasional chest pain and feeling like she's "drained of energy."

## 2018-01-03 ENCOUNTER — Ambulatory Visit: Payer: Federal, State, Local not specified - PPO | Admitting: Nurse Practitioner

## 2018-01-04 ENCOUNTER — Ambulatory Visit: Payer: Federal, State, Local not specified - PPO | Admitting: Nurse Practitioner

## 2018-01-04 ENCOUNTER — Encounter: Payer: Self-pay | Admitting: Nurse Practitioner

## 2018-01-04 VITALS — BP 122/78 | HR 51 | Temp 98.0°F | Ht 61.0 in | Wt 189.2 lb

## 2018-01-04 DIAGNOSIS — I10 Essential (primary) hypertension: Secondary | ICD-10-CM

## 2018-01-04 DIAGNOSIS — J0140 Acute pansinusitis, unspecified: Secondary | ICD-10-CM

## 2018-01-04 DIAGNOSIS — R102 Pelvic and perineal pain: Secondary | ICD-10-CM | POA: Diagnosis not present

## 2018-01-04 DIAGNOSIS — D500 Iron deficiency anemia secondary to blood loss (chronic): Secondary | ICD-10-CM

## 2018-01-04 DIAGNOSIS — R739 Hyperglycemia, unspecified: Secondary | ICD-10-CM

## 2018-01-04 LAB — CBC
HEMATOCRIT: 34.7 % — AB (ref 36.0–46.0)
Hemoglobin: 11.5 g/dL — ABNORMAL LOW (ref 12.0–15.0)
MCHC: 33.1 g/dL (ref 30.0–36.0)
MCV: 76.9 fl — AB (ref 78.0–100.0)
Platelets: 316 10*3/uL (ref 150.0–400.0)
RBC: 4.52 Mil/uL (ref 3.87–5.11)
RDW: 16 % — AB (ref 11.5–15.5)
WBC: 6.7 10*3/uL (ref 4.0–10.5)

## 2018-01-04 LAB — BASIC METABOLIC PANEL
BUN: 7 mg/dL (ref 6–23)
CALCIUM: 8.5 mg/dL (ref 8.4–10.5)
CO2: 27 meq/L (ref 19–32)
CREATININE: 0.72 mg/dL (ref 0.40–1.20)
Chloride: 103 mEq/L (ref 96–112)
GFR: 100.89 mL/min (ref >60.00)
Glucose, Bld: 95 mg/dL (ref 70–99)
Potassium: 3.5 mEq/L (ref 3.5–5.1)
SODIUM: 137 meq/L (ref 135–145)

## 2018-01-04 LAB — HEMOGLOBIN A1C: HEMOGLOBIN A1C: 6.2 % (ref 4.6–6.5)

## 2018-01-04 MED ORDER — AZITHROMYCIN 250 MG PO TABS: 250.0000 mg | ORAL_TABLET | Freq: Every day | ORAL | 0 refills | Status: DC

## 2018-01-04 MED ORDER — CETIRIZINE HCL 10 MG PO TABS: 10.0000 mg | ORAL_TABLET | Freq: Every day | ORAL | 0 refills | Status: DC

## 2018-01-04 NOTE — Patient Instructions (Addendum)
Pending urine culture. Stable cbc, HgbA1c, and iron panel. Resume ferrous sulfate supplement 325mg  OTC once a day with food. Complete azithromycin as prescribed.  Need to decrease salt intake by maintain DASH diet.  Maintain adequate oral hydration.

## 2018-01-04 NOTE — Progress Notes (Signed)
Subjective:  Patient ID: Adonis Housekeeper, female    DOB: Nov 11, 1985  Age: 32 y.o. MRN: [id number]  CC: Fatigue (pt is complaining of fatigue,legs and hands swelling and weakness, nausea/going on 2 days. )  Sinusitis  This is a new problem. The current episode started more than 1 month ago. The problem has been waxing and waning since onset. Associated symptoms include chills, congestion, coughing, headaches and sinus pressure. Pertinent negatives include no ear pain, neck pain, shortness of breath, sneezing, sore throat or swollen glands. Past treatments include oral decongestants. The treatment provided mild relief.  also complains of fatigue and LE edema. LMP 12/06/2017. Use on nuvaring Episode of Nausea yesterday after food intake (pasta and fried fish). admits to high sodium diet  Reviewed past Medical, Social and Family history today.  Outpatient Medications Prior to Visit  Medication Sig Dispense Refill  . amLODipine (NORVASC) 5 MG tablet Take 1 tablet (5 mg total) by mouth daily. 90 tablet 3  . clotrimazole (LOTRIMIN) 1 % cream Apply 1 application topically 2 (two) times daily. 60 g 1  . etonogestrel-ethinyl estradiol (NUVARING) 0.12-0.015 MG/24HR vaginal ring Insert vaginally and leave in place for 3 consecutive weeks, then remove for 1 week. 3 each 3  . FLUoxetine (PROZAC) 10 MG tablet Take 1 tablet (10 mg total) by mouth daily. 90 tablet 3  . hydrochlorothiazide (HYDRODIURIL) 25 MG tablet Take 1 tablet (25 mg total) by mouth daily. 90 tablet 3  . mometasone (NASONEX) 50 MCG/ACT nasal spray Place 2 sprays into the nose daily. 17 g 12  . pantoprazole (PROTONIX) 20 MG tablet Take 1 tablet (20 mg total) by mouth daily. 90 tablet 3  . cyclobenzaprine (FLEXERIL) 10 MG tablet     . metroNIDAZOLE (FLAGYL) 500 MG tablet Take 1 tablet (500 mg total) by mouth 2 (two) times daily. 14 tablet 0  . potassium chloride SA (K-DUR,KLOR-CON) 20 MEQ tablet Take 1 tablet (20 mEq total) by mouth daily. 90  tablet 3   No facility-administered medications prior to visit.     ROS See HPI  Objective:  BP 122/78   Pulse (!) 51   Temp 98 F (36.7 C) (Oral)   Ht 5\' 1"  (1.549 m)   Wt 189 lb 3.2 oz (85.8 kg)   LMP 12/06/2017   SpO2 98%   BMI 35.75 kg/m   BP Readings from Last 3 Encounters:  01/04/18 122/78  09/17/17 (!) 149/93  07/14/17 110/76    Wt Readings from Last 3 Encounters:  01/04/18 189 lb 3.2 oz (85.8 kg)  09/17/17 180 lb (81.6 kg)  07/14/17 181 lb (82.1 kg)    Physical Exam Vitals signs reviewed.  HENT:     Nose: Congestion and rhinorrhea present.     Right Turbinates: Swollen.     Left Turbinates: Swollen.     Right Sinus: Maxillary sinus tenderness and frontal sinus tenderness present.     Left Sinus: Maxillary sinus tenderness and frontal sinus tenderness present.     Mouth/Throat:     Pharynx: No oropharyngeal exudate or posterior oropharyngeal erythema.  Neck:     Musculoskeletal: Normal range of motion and neck supple.     Vascular: No JVD.  Cardiovascular:     Rate and Rhythm: Normal rate and regular rhythm.     Pulses: Normal pulses.     Heart sounds: Normal heart sounds.  Pulmonary:     Effort: Pulmonary effort is normal.     Breath sounds: Normal breath sounds.  Abdominal:     General: Bowel sounds are normal. There is no distension.     Palpations: Abdomen is soft. There is no mass.     Tenderness: There is abdominal tenderness. There is no right CVA tenderness, left CVA tenderness or guarding.  Musculoskeletal:     Right lower leg: Edema present.     Left lower leg: Edema present.  Lymphadenopathy:     Cervical: No cervical adenopathy.  Neurological:     General: No focal deficit present.     Mental Status: She is alert and oriented to person, place, and time.  Psychiatric:        Mood and Affect: Mood normal.        Behavior: Behavior normal.        Thought Content: Thought content normal.     Lab Results  Component Value Date    WBC 6.7 01/04/2018   HGB 11.5 (L) 01/04/2018   HCT 34.7 (L) 01/04/2018   PLT 316.0 01/04/2018   GLUCOSE 95 01/04/2018   CHOL 125 05/04/2016   TRIG 77.0 05/04/2016   HDL 45.80 05/04/2016   LDLCALC 64 05/04/2016   ALT 10 03/17/2017   AST 13 03/17/2017   NA 137 01/04/2018   K 3.5 01/04/2018   CL 103 01/04/2018   CREATININE 0.72 01/04/2018   BUN 7 01/04/2018   CO2 27 01/04/2018   TSH 1.53 03/17/2017   HGBA1C 6.2 01/04/2018    Assessment & Plan:   Carletha was seen today for fatigue.  Diagnoses and all orders for this visit:  Acute non-recurrent pansinusitis -     azithromycin (ZITHROMAX Z-PAK) 250 MG tablet; Take 1 tablet (250 mg total) by mouth daily. Take 2tabs on first day, then 1tab once a day till complete -     cetirizine (ZYRTEC) 10 MG tablet; Take 1 tablet (10 mg total) by mouth daily.  Suprapubic abdominal pain -     Urinalysis w microscopic + reflex cultur  Essential hypertension -     Basic metabolic panel  Iron deficiency anemia due to chronic blood loss -     CBC -     Iron, TIBC and Ferritin Panel -     ferrous sulfate 325 (65 FE) MG tablet; Take 1 tablet (325 mg total) by mouth daily with breakfast.  Hyperglycemia -     Hemoglobin A1c  Other orders -     REFLEXIVE URINE CULTURE   I have discontinued Chasmine Sheckler's cyclobenzaprine, potassium chloride SA, and metroNIDAZOLE. I am also having her start on azithromycin, cetirizine, and ferrous sulfate. Additionally, I am having her maintain her clotrimazole, mometasone, amLODipine, FLUoxetine, hydrochlorothiazide, pantoprazole, and etonogestrel-ethinyl estradiol.  Meds ordered this encounter  Medications  . azithromycin (ZITHROMAX Z-PAK) 250 MG tablet    Sig: Take 1 tablet (250 mg total) by mouth daily. Take 2tabs on first day, then 1tab once a day till complete    Dispense:  6 tablet    Refill:  0    Order Specific Question:   Supervising Provider    Answer:   Lucille Passy [[id number]]  . cetirizine (ZYRTEC) 10  MG tablet    Sig: Take 1 tablet (10 mg total) by mouth daily.    Dispense:  30 tablet    Refill:  0    Order Specific Question:   Supervising Provider    Answer:   Lucille Passy [[id number]]  . ferrous sulfate 325 (65 FE) MG tablet    Sig: Take  1 tablet (325 mg total) by mouth daily with breakfast.    Refill:  3    Order Specific Question:   Supervising Provider    Answer:   Lucille Passy [[id number]]    Problem List Items Addressed This Visit      Cardiovascular and Mediastinum   Hypertension   Relevant Orders   Basic metabolic panel (Completed)     Other   Anemia   Relevant Medications   ferrous sulfate 325 (65 FE) MG tablet   Other Relevant Orders   CBC (Completed)   Iron, TIBC and Ferritin Panel (Completed)    Other Visit Diagnoses    Acute non-recurrent pansinusitis    -  Primary   Relevant Medications   azithromycin (ZITHROMAX Z-PAK) 250 MG tablet   cetirizine (ZYRTEC) 10 MG tablet   Suprapubic abdominal pain       Relevant Orders   Urinalysis w microscopic + reflex cultur (Completed)   Hyperglycemia       Relevant Orders   Hemoglobin A1c (Completed)       Follow-up: Return if symptoms worsen or fail to improve.  Wilfred Lacy, NP

## 2018-01-05 ENCOUNTER — Encounter: Payer: Self-pay | Admitting: Nurse Practitioner

## 2018-01-05 LAB — URINALYSIS W MICROSCOPIC + REFLEX CULTURE
BILIRUBIN URINE: NEGATIVE
Bacteria, UA: NONE SEEN /HPF
Glucose, UA: NEGATIVE
Hgb urine dipstick: NEGATIVE
Hyaline Cast: NONE SEEN /LPF
KETONES UR: NEGATIVE
LEUKOCYTE ESTERASE: NEGATIVE
NITRITES URINE, INITIAL: NEGATIVE
PH: 6.5 (ref 5.0–8.0)
Protein, ur: NEGATIVE
RBC / HPF: NONE SEEN /HPF (ref 0–2)
SPECIFIC GRAVITY, URINE: 1.026 (ref 1.001–1.03)

## 2018-01-05 LAB — IRON,TIBC AND FERRITIN PANEL
%SAT: 17 % (calc) (ref 16–45)
Ferritin: 11 ng/mL — ABNORMAL LOW (ref 16–154)
IRON: 70 ug/dL (ref 40–190)
TIBC: 408 mcg/dL (calc) (ref 250–450)

## 2018-01-05 LAB — NO CULTURE INDICATED

## 2018-01-05 MED ORDER — FERROUS SULFATE 325 (65 FE) MG PO TABS: 325.0000 mg | ORAL_TABLET | Freq: Every day | ORAL | 3 refills | Status: DC

## 2018-02-16 ENCOUNTER — Encounter: Payer: Self-pay | Admitting: Nurse Practitioner

## 2018-02-16 ENCOUNTER — Other Ambulatory Visit (HOSPITAL_COMMUNITY)
Admission: RE | Admit: 2018-02-16 | Discharge: 2018-02-16 | Disposition: A | Discharge status: Home / Self Care | Payer: Federal, State, Local not specified - PPO | Source: Ambulatory Visit | Attending: Nurse Practitioner | Admitting: Nurse Practitioner

## 2018-02-16 ENCOUNTER — Ambulatory Visit: Payer: Federal, State, Local not specified - PPO | Admitting: Nurse Practitioner

## 2018-02-16 VITALS — BP 130/80 | HR 77 | Temp 98.1°F | Ht 61.0 in | Wt 190.8 lb

## 2018-02-16 DIAGNOSIS — N898 Other specified noninflammatory disorders of vagina: Secondary | ICD-10-CM | POA: Insufficient documentation

## 2018-02-16 DIAGNOSIS — R5382 Chronic fatigue, unspecified: Secondary | ICD-10-CM | POA: Diagnosis not present

## 2018-02-16 MED ORDER — FLUCONAZOLE 150 MG PO TABS: 150.0000 mg | ORAL_TABLET | Freq: Once | ORAL | 0 refills | Status: AC

## 2018-02-16 NOTE — Progress Notes (Signed)
Subjective:  Patient ID: Katrina Garcia, female    DOB: 1985/08/02  Age: 33 y.o. MRN: [id number]  CC: Vaginal Itching (pt is complaning of itchy and odor after menstual cycle. took yeast med otc last night-help with itchy)  HPI  Ms. Baez presents with recurrent vaginal itching and discharge. Worse after menstrual cycle and use of oral abx. No improvement with AZO tabs OTC. She is sexually active with use of nuvaring. No pelvic pain, no dysuria, no vaginal lesions.  Reviewed past Medical, Social and Family history today.  Outpatient Medications Prior to Visit  Medication Sig Dispense Refill  . amLODipine (NORVASC) 5 MG tablet Take 1 tablet (5 mg total) by mouth daily. 90 tablet 3  . BIOTIN PO Take by mouth.    . clotrimazole (LOTRIMIN) 1 % cream Apply 1 application topically 2 (two) times daily. 60 g 1  . etonogestrel-ethinyl estradiol (NUVARING) 0.12-0.015 MG/24HR vaginal ring Insert vaginally and leave in place for 3 consecutive weeks, then remove for 1 week. 3 each 3  . FLUoxetine (PROZAC) 10 MG tablet Take 1 tablet (10 mg total) by mouth daily. 90 tablet 3  . hydrochlorothiazide (HYDRODIURIL) 25 MG tablet Take 1 tablet (25 mg total) by mouth daily. 90 tablet 3  . mometasone (NASONEX) 50 MCG/ACT nasal spray Place 2 sprays into the nose daily. 17 g 12  . pantoprazole (PROTONIX) 20 MG tablet Take 1 tablet (20 mg total) by mouth daily. 90 tablet 3  . Prenatal Vit-Fe Fumarate-FA (PRENATAL MULTIVITAMIN) TABS tablet Take 1 tablet by mouth daily at 12 noon.    Marland Kitchen azithromycin (ZITHROMAX Z-PAK) 250 MG tablet Take 1 tablet (250 mg total) by mouth daily. Take 2tabs on first day, then 1tab once a day till complete (Patient not taking: Reported on 02/16/2018) 6 tablet 0  . cetirizine (ZYRTEC) 10 MG tablet Take 1 tablet (10 mg total) by mouth daily. (Patient not taking: Reported on 02/16/2018) 30 tablet 0  . ferrous sulfate 325 (65 FE) MG tablet Take 1 tablet (325 mg total) by mouth daily with breakfast.  (Patient not taking: Reported on 02/16/2018)  3   No facility-administered medications prior to visit.     ROS See HPI  Objective:  BP 130/80   Pulse 77   Temp 98.1 F (36.7 C) (Oral)   Ht 5\' 1"  (1.549 m)   Wt 190 lb 12.8 oz (86.5 kg)   SpO2 98%   BMI 36.05 kg/m   BP Readings from Last 3 Encounters:  02/16/18 130/80  01/04/18 122/78  09/17/17 (!) 149/93    Wt Readings from Last 3 Encounters:  02/16/18 190 lb 12.8 oz (86.5 kg)  01/04/18 189 lb 3.2 oz (85.8 kg)  09/17/17 180 lb (81.6 kg)    Physical Exam Vitals signs reviewed. Exam conducted with a chaperone present.  Genitourinary:    Vagina: Vaginal discharge present. No erythema, tenderness or lesions.     Cervix: Discharge present. No friability, lesion or erythema.     Adnexa: Right adnexa normal and left adnexa normal.  Neurological:     Mental Status: She is alert.    Lab Results  Component Value Date   WBC 6.7 01/04/2018   HGB 11.5 (L) 01/04/2018   HCT 34.7 (L) 01/04/2018   PLT 316.0 01/04/2018   GLUCOSE 95 01/04/2018   CHOL 125 05/04/2016   TRIG 77.0 05/04/2016   HDL 45.80 05/04/2016   LDLCALC 64 05/04/2016   ALT 10 03/17/2017   AST 13 03/17/2017  NA 137 01/04/2018   K 3.5 01/04/2018   CL 103 01/04/2018   CREATININE 0.72 01/04/2018   BUN 7 01/04/2018   CO2 27 01/04/2018   TSH 1.53 03/17/2017   HGBA1C 6.2 01/04/2018    Assessment & Plan:   Katrina Garcia was seen today for vaginal itching.  Diagnoses and all orders for this visit:  Vaginal discharge -     Cervicovaginal ancillary only -     fluconazole (DIFLUCAN) 150 MG tablet; Take 1 tablet (150 mg total) by mouth once for 1 dose.  Chronic fatigue   I have discontinued Katrina Garcia's azithromycin, cetirizine, and ferrous sulfate. I am also having her start on fluconazole. Additionally, I am having her maintain her clotrimazole, mometasone, amLODipine, FLUoxetine, hydrochlorothiazide, pantoprazole, etonogestrel-ethinyl estradiol, prenatal  multivitamin, and BIOTIN PO.  Meds ordered this encounter  Medications  . fluconazole (DIFLUCAN) 150 MG tablet    Sig: Take 1 tablet (150 mg total) by mouth once for 1 dose.    Dispense:  1 tablet    Refill:  0    Order Specific Question:   Supervising Provider    Answer:   Lucille Passy [[id number]]    Problem List Items Addressed This Visit      Other   Chronic fatigue    Other Visit Diagnoses    Vaginal discharge    -  Primary   Relevant Medications   fluconazole (DIFLUCAN) 150 MG tablet   Other Relevant Orders   Cervicovaginal ancillary only       Follow-up: Return if symptoms worsen or fail to improve.  Wilfred Lacy, NP

## 2018-02-16 NOTE — Patient Instructions (Addendum)
Please make appt with [facility] as previous discussed to address chronic fatigue and memory lapse.  You will be contacted with lab results.  Start vitamin D 1000IU OTC once daily in combination with Prenatal multivitamin.

## 2018-02-17 ENCOUNTER — Other Ambulatory Visit: Payer: Self-pay | Admitting: Family Medicine

## 2018-02-17 ENCOUNTER — Encounter: Payer: Self-pay | Admitting: Nurse Practitioner

## 2018-02-17 DIAGNOSIS — B9689 Other specified bacterial agents as the cause of diseases classified elsewhere: Secondary | ICD-10-CM

## 2018-02-17 DIAGNOSIS — N760 Acute vaginitis: Principal | ICD-10-CM

## 2018-02-17 LAB — CERVICOVAGINAL ANCILLARY ONLY
Bacterial vaginitis: POSITIVE — AB
Candida vaginitis: NEGATIVE
Chlamydia: NEGATIVE
Neisseria Gonorrhea: NEGATIVE
Trichomonas: NEGATIVE

## 2018-02-17 MED ORDER — METRONIDAZOLE 500 MG PO TABS: 500.0000 mg | ORAL_TABLET | Freq: Two times a day (BID) | ORAL | 0 refills | Status: AC

## 2018-02-18 ENCOUNTER — Encounter: Payer: Self-pay | Admitting: Nurse Practitioner

## 2018-03-14 DIAGNOSIS — G43009 Migraine without aura, not intractable, without status migrainosus: Secondary | ICD-10-CM | POA: Diagnosis not present

## 2018-03-16 ENCOUNTER — Ambulatory Visit: Payer: Federal, State, Local not specified - PPO | Admitting: Nurse Practitioner

## 2018-03-17 ENCOUNTER — Telehealth: Payer: Self-pay | Admitting: Nurse Practitioner

## 2018-03-17 NOTE — Telephone Encounter (Signed)
I called patient and left message on voicemail to call office and schedule appointment with [facility]. This call was a follow up to message sent by patient requesting an appointment for headaches.

## 2018-04-11 ENCOUNTER — Encounter: Payer: Self-pay | Admitting: Nurse Practitioner

## 2018-04-11 ENCOUNTER — Other Ambulatory Visit: Payer: Self-pay | Admitting: Nurse Practitioner

## 2018-04-11 DIAGNOSIS — Z30015 Encounter for initial prescription of vaginal ring hormonal contraceptive: Secondary | ICD-10-CM

## 2018-04-11 NOTE — Telephone Encounter (Signed)
Left vm for the pt to call back. According to our record she should have it until 07/2018 but pharmacy stated rx pick up 4 times already since 07/2017.

## 2018-06-16 ENCOUNTER — Other Ambulatory Visit: Payer: Self-pay | Admitting: Nurse Practitioner

## 2018-06-16 ENCOUNTER — Encounter: Payer: Self-pay | Admitting: Nurse Practitioner

## 2018-06-16 ENCOUNTER — Other Ambulatory Visit: Payer: Self-pay

## 2018-06-16 ENCOUNTER — Telehealth: Payer: Federal, State, Local not specified - PPO | Admitting: Nurse Practitioner

## 2018-06-16 ENCOUNTER — Other Ambulatory Visit: Payer: Federal, State, Local not specified - PPO

## 2018-06-16 VITALS — Ht 61.0 in

## 2018-06-16 DIAGNOSIS — R350 Frequency of micturition: Secondary | ICD-10-CM

## 2018-06-16 DIAGNOSIS — R300 Dysuria: Secondary | ICD-10-CM

## 2018-06-17 ENCOUNTER — Telehealth (INDEPENDENT_AMBULATORY_CARE_PROVIDER_SITE_OTHER): Payer: Federal, State, Local not specified - PPO | Admitting: Nurse Practitioner

## 2018-06-17 ENCOUNTER — Encounter: Payer: Self-pay | Admitting: Nurse Practitioner

## 2018-06-17 VITALS — Ht 61.0 in

## 2018-06-17 DIAGNOSIS — R300 Dysuria: Secondary | ICD-10-CM | POA: Diagnosis not present

## 2018-06-17 DIAGNOSIS — R350 Frequency of micturition: Secondary | ICD-10-CM | POA: Diagnosis not present

## 2018-06-17 LAB — POCT URINALYSIS DIPSTICK
Bilirubin, UA: NEGATIVE
Blood, UA: NEGATIVE
Glucose, UA: NEGATIVE
Ketones, UA: NEGATIVE
Leukocytes, UA: NEGATIVE
Nitrite, UA: NEGATIVE
Protein, UA: POSITIVE — AB
Spec Grav, UA: 1.02 (ref 1.010–1.025)
Urobilinogen, UA: 0.2 E.U./dL
pH, UA: 7 (ref 5.0–8.0)

## 2018-06-17 NOTE — Progress Notes (Signed)
Virtual Visit via Video Note  I connected with Katrina Garcia on 06/17/18 at 10:45 AM EDT by a video enabled telemedicine application and verified that I am speaking with the correct person using two identifiers.  Location: Patient: in her car Provider: office   I discussed the limitations of evaluation and management by telemedicine and the availability of in person appointments. The patient expressed understanding and agreed to proceed.  CC: dysuria and frequency x 4days.  History of Present Illness:  Urinary Tract Infection  This is a new problem. The current episode started in the past 7 days. The problem occurs every urination. The problem has been unchanged. The quality of the pain is described as burning. There has been no fever. She is sexually active. There is no history of pyelonephritis. Associated symptoms include frequency. Pertinent negatives include no chills, discharge, flank pain, hematuria, hesitancy, nausea, possible pregnancy, sweats, urgency or vomiting. She has tried increased fluids for the symptoms. The treatment provided no relief. There is no history of catheterization, kidney stones, recurrent UTIs, urinary stasis or a urological procedure.  use of nuvaring. Denies any perineal rash.  Observations/Objective: .Physical Exam  Constitutional: She is oriented to person, place, and time. No distress.  Pulmonary/Chest: Effort normal.  Neurological: She is alert and oriented to person, place, and time.   Assessment and Plan: [name] was seen today for urinary tract infection.  Diagnoses and all orders for this visit:  Dysuria -     POCT urinalysis dipstick -     Urine Culture  Frequent urination -     POCT urinalysis dipstick -     Urine Culture   Follow Up Instructions: Go to lab for urine collection at 12noon today. Maintain adequate oral hydration. May use AZO otc 1tab BIDprn x 2days for dysuria   I discussed the assessment and treatment plan with the  patient. The patient was provided an opportunity to ask questions and all were answered. The patient agreed with the plan and demonstrated an understanding of the instructions.   The patient was advised to call back or seek an in-person evaluation if the symptoms worsen or if the condition fails to improve as anticipated.   Wilfred Lacy, NP

## 2018-06-18 LAB — URINE CULTURE
MICRO NUMBER:: 564469
SPECIMEN QUALITY:: ADEQUATE

## 2018-06-20 NOTE — Addendum Note (Signed)
Addended byShawnie Pons on: 06/20/2018 02:11 PM   Modules accepted: Orders

## 2018-06-27 ENCOUNTER — Other Ambulatory Visit: Payer: Self-pay | Admitting: Nurse Practitioner

## 2018-06-27 DIAGNOSIS — I10 Essential (primary) hypertension: Secondary | ICD-10-CM

## 2018-06-27 NOTE — Telephone Encounter (Signed)
Baldo Ash please advise, ok to send in 90 days supply. Pt schedule a CPE with you on 07/05/2018.

## 2018-07-04 ENCOUNTER — Telehealth: Payer: Self-pay | Admitting: Nurse Practitioner

## 2018-07-04 NOTE — Telephone Encounter (Signed)
Questions for Screening COVID-19  Symptom onset: n/a  Travel or Contacts: NO  During this illness, did/does the patient experience any of the following symptoms? Fever >100.22F []   Yes [x]   No []   Unknown Subjective fever (felt feverish) []   Yes [x]   No []   Unknown Chills []   Yes [x]   No []   Unknown Muscle aches (myalgia) []   Yes [x]   No []   Unknown Runny nose (rhinorrhea) []   Yes [x]   No []   Unknown Sore throat []   Yes []   No [x]   Unknown Cough (new onset or worsening of chronic cough) []   Yes [x]   No []   Unknown Shortness of breath (dyspnea) []   Yes [x]   No []   Unknown Nausea or vomiting []   Yes [x]   No []   Unknown Headache []   Yes [x]   No []   Unknown Abdominal pain  []   Yes [x]   No []   Unknown Diarrhea (?3 loose/looser than normal stools/24hr period) []   Yes [x]   No []   Unknown Other, specify:  Patient risk factors: Smoker? []   Current []   Former []   Never If female, currently pregnant? []   Yes []   No  Patient Active Problem List   Diagnosis Date Noted  . Chronic fatigue 04/14/2017  . Anemia 03/17/2017  . Contraception management 01/22/2014  . Essential hypertension 01/22/2014  . Fibroid uterus 07/29/2013  . MDD (major depressive disorder) 08/12/2012  . PTSD (post-traumatic stress disorder) 08/12/2012    Plan:  []   High risk for COVID-19 with red flags go to ED (with CP, SOB, weak/lightheaded, or fever > 101.5). Call ahead.  []   High risk for COVID-19 but stable. Inform provider and coordinate time for [facility] visit.   []   No red flags but URI signs or symptoms okay for [facility] visit.

## 2018-07-05 ENCOUNTER — Ambulatory Visit (INDEPENDENT_AMBULATORY_CARE_PROVIDER_SITE_OTHER): Payer: Federal, State, Local not specified - PPO | Admitting: Nurse Practitioner

## 2018-07-05 ENCOUNTER — Other Ambulatory Visit (HOSPITAL_COMMUNITY)
Admission: RE | Admit: 2018-07-05 | Discharge: 2018-07-05 | Disposition: A | Discharge status: Home / Self Care | Payer: Federal, State, Local not specified - PPO | Source: Ambulatory Visit | Attending: Nurse Practitioner | Admitting: Nurse Practitioner

## 2018-07-05 ENCOUNTER — Encounter: Payer: Self-pay | Admitting: Nurse Practitioner

## 2018-07-05 VITALS — BP 140/90 | HR 61 | Temp 98.2°F | Ht 61.81 in | Wt 185.0 lb

## 2018-07-05 DIAGNOSIS — Z13220 Encounter for screening for lipoid disorders: Secondary | ICD-10-CM | POA: Diagnosis not present

## 2018-07-05 DIAGNOSIS — Z0001 Encounter for general adult medical examination with abnormal findings: Secondary | ICD-10-CM

## 2018-07-05 DIAGNOSIS — R7303 Prediabetes: Secondary | ICD-10-CM

## 2018-07-05 DIAGNOSIS — Z114 Encounter for screening for human immunodeficiency virus [HIV]: Secondary | ICD-10-CM

## 2018-07-05 DIAGNOSIS — B9689 Other specified bacterial agents as the cause of diseases classified elsewhere: Secondary | ICD-10-CM

## 2018-07-05 DIAGNOSIS — Z113 Encounter for screening for infections with a predominantly sexual mode of transmission: Secondary | ICD-10-CM | POA: Diagnosis not present

## 2018-07-05 DIAGNOSIS — Z136 Encounter for screening for cardiovascular disorders: Secondary | ICD-10-CM

## 2018-07-05 DIAGNOSIS — Z124 Encounter for screening for malignant neoplasm of cervix: Secondary | ICD-10-CM | POA: Diagnosis not present

## 2018-07-05 DIAGNOSIS — I10 Essential (primary) hypertension: Secondary | ICD-10-CM | POA: Diagnosis not present

## 2018-07-05 DIAGNOSIS — D500 Iron deficiency anemia secondary to blood loss (chronic): Secondary | ICD-10-CM | POA: Diagnosis not present

## 2018-07-05 DIAGNOSIS — R87618 Other abnormal cytological findings on specimens from cervix uteri: Secondary | ICD-10-CM

## 2018-07-05 DIAGNOSIS — N760 Acute vaginitis: Secondary | ICD-10-CM | POA: Diagnosis not present

## 2018-07-05 DIAGNOSIS — R87612 Low grade squamous intraepithelial lesion on cytologic smear of cervix (LGSIL): Secondary | ICD-10-CM | POA: Diagnosis not present

## 2018-07-05 DIAGNOSIS — R8789 Other abnormal findings in specimens from female genital organs: Secondary | ICD-10-CM

## 2018-07-05 NOTE — Patient Instructions (Addendum)
Take BP medication daily.  Cbc indicates persistent anemia. Continue OTC ferrous sulfate supplement 325mg  1tab BID with food. Normal lipid panel, thyroid, renal and liver function. Mild elevation in glucose with persistent Hgba1c of 6.2. This indicates prediabetes. Need to make changes to diet (DASH diet) and maintain regular exercise regimen. Wet prep is positive for BV. metrogel sent. Pending PAP results  Health Maintenance, Female Adopting a healthy lifestyle and getting preventive care are important in promoting health and wellness. Ask your health care provider about:  The right schedule for you to have regular tests and exams.  Things you can do on your own to prevent diseases and keep yourself healthy. What should I know about diet, weight, and exercise? Eat a healthy diet   Eat a diet that includes plenty of vegetables, fruits, low-fat dairy products, and lean protein.  Do not eat a lot of foods that are high in solid fats, added sugars, or sodium. Maintain a healthy weight Body mass index (BMI) is used to identify weight problems. It estimates body fat based on height and weight. Your health care provider can help determine your BMI and help you achieve or maintain a healthy weight. Get regular exercise Get regular exercise. This is one of the most important things you can do for your health. Most adults should:  Exercise for at least 150 minutes each week. The exercise should increase your heart rate and make you sweat (moderate-intensity exercise).  Do strengthening exercises at least twice a week. This is in addition to the moderate-intensity exercise.  Spend less time sitting. Even light physical activity can be beneficial. Watch cholesterol and blood lipids Have your blood tested for lipids and cholesterol at 31 years of age, then have this test every 5 years. Have your cholesterol levels checked more often if:  Your lipid or cholesterol levels are high.  You are  older than 31 years of age.  You are at high risk for heart disease. What should I know about cancer screening? Depending on your health history and family history, you may need to have cancer screening at various ages. This may include screening for:  Breast cancer.  Cervical cancer.  Colorectal cancer.  Skin cancer.  Lung cancer. What should I know about heart disease, diabetes, and high blood pressure? Blood pressure and heart disease  High blood pressure causes heart disease and increases the risk of stroke. This is more likely to develop in people who have high blood pressure readings, are of African descent, or are overweight.  Have your blood pressure checked: ? Every 3-5 years if you are 21-20 years of age. ? Every year if you are 65 years old or older. Diabetes Have regular diabetes screenings. This checks your fasting blood sugar level. Have the screening done:  Once every three years after age 48 if you are at a normal weight and have a low risk for diabetes.  More often and at a younger age if you are overweight or have a high risk for diabetes. What should I know about preventing infection? Hepatitis B If you have a higher risk for hepatitis B, you should be screened for this virus. Talk with your health care provider to find out if you are at risk for hepatitis B infection. Hepatitis C Testing is recommended for:  Everyone born from [birth year] through 1963.  Anyone with known risk factors for hepatitis C. Sexually transmitted infections (STIs)  Get screened for STIs, including gonorrhea and chlamydia, if: ? You  are sexually active and are younger than 31 years of age. ? You are older than 31 years of age and your health care provider tells you that you are at risk for this type of infection. ? Your sexual activity has changed since you were last screened, and you are at increased risk for chlamydia or gonorrhea. Ask your health care provider if you are at risk.   Ask your health care provider about whether you are at high risk for HIV. Your health care provider may recommend a prescription medicine to help prevent HIV infection. If you choose to take medicine to prevent HIV, you should first get tested for HIV. You should then be tested every 3 months for as long as you are taking the medicine. Pregnancy  If you are about to stop having your period (premenopausal) and you may become pregnant, seek counseling before you get pregnant.  Take 400 to 800 micrograms (mcg) of folic acid every day if you become pregnant.  Ask for birth control (contraception) if you want to prevent pregnancy. Osteoporosis and menopause Osteoporosis is a disease in which the bones lose minerals and strength with aging. This can result in bone fractures. If you are 30 years old or older, or if you are at risk for osteoporosis and fractures, ask your health care provider if you should:  Be screened for bone loss.  Take a calcium or vitamin D supplement to lower your risk of fractures.  Be given hormone replacement therapy (HRT) to treat symptoms of menopause. Follow these instructions at home: Lifestyle  Do not use any products that contain nicotine or tobacco, such as cigarettes, e-cigarettes, and chewing tobacco. If you need help quitting, ask your health care provider.  Do not use street drugs.  Do not share needles.  Ask your health care provider for help if you need support or information about quitting drugs. Alcohol use  Do not drink alcohol if: ? Your health care provider tells you not to drink. ? You are pregnant, may be pregnant, or are planning to become pregnant.  If you drink alcohol: ? Limit how much you use to 0-1 drink a day. ? Limit intake if you are breastfeeding.  Be aware of how much alcohol is in your drink. In the [place] one drink equals one 12 oz bottle of beer (355 mL), one 5 oz glass of wine (148 mL), or one 1 oz glass of hard liquor (44 mL).  General instructions  Schedule regular health, dental, and eye exams.  Stay current with your vaccines.  Tell your health care provider if: ? You often feel depressed. ? You have ever been abused or do not feel safe at home. Summary  Adopting a healthy lifestyle and getting preventive care are important in promoting health and wellness.  Follow your health care provider's instructions about healthy diet, exercising, and getting tested or screened for diseases.  Follow your health care provider's instructions on monitoring your cholesterol and blood pressure. This information is not intended to replace advice given to you by your health care provider. Make sure you discuss any questions you have with your health care provider. Document Released: 07/07/2010 Document Revised: 12/15/2017 Document Reviewed: 12/15/2017 Elsevier Patient Education  2020 [employer].

## 2018-07-05 NOTE — Progress Notes (Addendum)
Subjective:    Patient ID: Katrina Garcia, female    DOB: [birth year], 33 y.o.   MRN: [id number]  Patient presents today for complete physical and f/up on chronic conditions  HPI  HTN: Admits she does not take amlodipine daily. BP Readings from Last 3 Encounters:  07/05/18 140/90  02/16/18 130/80  01/04/18 122/78   Sexual History (orientation,birth control, marital status, STD):sexually active, intermittent use of condoms  Depression/Suicide: Depression screen [facility] 2/9 06/16/2018 04/14/2017 11/04/2016 05/04/2016 11/22/2013  Decreased Interest 2 1 3  0 0  Down, Depressed, Hopeless 1 0 2 0 0  PHQ - 2 Score 3 1 5  0 0  Altered sleeping 3 3 3  - -  Tired, decreased energy 2 3 3  - -  Change in appetite 0 3 3 - -  Feeling bad or failure about yourself  1 0 2 - -  Trouble concentrating 0 0 3 - -  Moving slowly or fidgety/restless 0 0 3 - -  Suicidal thoughts 0 0 0 - -  PHQ-9 Score 9 10 22  - -    Vision:up to date  Dental:up to date  Immunizations: (TDAP, Hep C screen, Pneumovax, Influenza, zoster)  Health Maintenance  Topic Date Due  . Flu Shot  08/06/2018  . Pap Smear  05/05/2019  . Tetanus Vaccine  11/02/2023  . HIV Screening  Completed   Diet:regular.  Weight:  Wt Readings from Last 3 Encounters:  07/05/18 185 lb (83.9 kg)  02/16/18 190 lb 12.8 oz (86.5 kg)  01/04/18 189 lb 3.2 oz (85.8 kg)    Exercise:none  Fall Risk: Fall Risk  06/16/2018 11/04/2016 05/04/2016 11/22/2013  Falls in the past year? 0 No No No   Medications and allergies reviewed with patient and updated if appropriate.  Patient Active Problem List   Diagnosis Date Noted  . Prediabetes 07/07/2018  . Pap smear abnormality of cervix/human papillomavirus (HPV) positive 07/07/2018  . Low grade squamous intraepithelial lesion on cytologic smear of cervix (LGSIL) 07/07/2018  . Chronic fatigue 04/14/2017  . Anemia 03/17/2017  . Essential hypertension 01/22/2014  . Fibroid uterus 07/29/2013  . MDD (major  depressive disorder) 08/12/2012  . PTSD (post-traumatic stress disorder) 08/12/2012    Current Outpatient Medications on File Prior to Visit  Medication Sig Dispense Refill  . amLODipine (NORVASC) 5 MG tablet Take 1 tablet by mouth once daily 90 tablet 0  . BIOTIN PO Take by mouth.    Marland Kitchen ELURYNG 0.12-0.015 MG/24HR vaginal ring INSERT ONE RING VAGINALLY AND LEAVE IN PLACE FOR 3 CONSECUTIVE WEEKS, THEN REMOVE FOR 1 WEEK 3 each 0  . FLUoxetine (PROZAC) 10 MG tablet Take 1 tablet (10 mg total) by mouth daily. 90 tablet 3  . hydrochlorothiazide (HYDRODIURIL) 25 MG tablet Take 1 tablet by mouth once daily 90 tablet 0  . mometasone (NASONEX) 50 MCG/ACT nasal spray Place 2 sprays into the nose daily. 17 g 12  . pantoprazole (PROTONIX) 20 MG tablet Take 1 tablet (20 mg total) by mouth daily. 90 tablet 3  . Prenatal Vit-Fe Fumarate-FA (PRENATAL MULTIVITAMIN) TABS tablet Take 1 tablet by mouth daily at 12 noon.    . clotrimazole (LOTRIMIN) 1 % cream Apply 1 application topically 2 (two) times daily. (Patient not taking: Reported on 06/16/2018) 60 g 1   No current facility-administered medications on file prior to visit.     Past Medical History:  Diagnosis Date  . Fibroid   . Fibroid uterus 07/29/2013   Left subserosal 3.5 x 3.1  x 2.5 Left Intramural 1.2 x 1.3 x 1.4    . HPV (human papilloma virus) infection   . MDD (major depressive disorder)   . Migraines   . Pregnancy induced hypertension   . PTSD (post-traumatic stress disorder)     Past Surgical History:  Procedure Laterality Date  . CESAREAN SECTION N/A 12/18/2013   Procedure: CESAREAN SECTION;  Surgeon: Truett Mainland, DO;  Location: [facility];  Service: Obstetrics;  Laterality: N/A;  . WISDOM TOOTH EXTRACTION      Social History   Socioeconomic History  . Marital status: Single    Spouse name: Not on file  . Number of children: Not on file  . Years of education: Not on file  . Highest education level: Not on file  Occupational  History  . Not on file  Social Needs  . Financial resource strain: Not on file  . Food insecurity    Worry: Not on file    Inability: Not on file  . Transportation needs    Medical: Not on file    Non-medical: Not on file  Tobacco Use  . Smoking status: Current Every Day Smoker    Packs/day: 0.50    Types: Cigarettes  . Smokeless tobacco: Never Used  Substance and Sexual Activity  . Alcohol use: No  . Drug use: No  . Sexual activity: Yes    Birth control/protection: None  Lifestyle  . Physical activity    Days per week: Not on file    Minutes per session: Not on file  . Stress: Not on file  Relationships  . Social Herbalist on phone: Not on file    Gets together: Not on file    Attends religious service: Not on file    Active member of club or organization: Not on file    Attends meetings of clubs or organizations: Not on file    Relationship status: Not on file  Other Topics Concern  . Not on file  Social History Narrative  . Not on file    Family History  Problem Relation Age of Onset  . Hypertension Father   . Cancer Paternal Grandmother        breast cancer  . Hypertension Paternal Grandmother   . Breast cancer Paternal Grandmother         Review of Systems  Constitutional: Negative for fever, malaise/fatigue and weight loss.  HENT: Negative for congestion and sore throat.   Eyes:       Negative for visual changes  Respiratory: Negative for cough and shortness of breath.   Cardiovascular: Negative for chest pain, palpitations and leg swelling.  Gastrointestinal: Negative for blood in stool, constipation, diarrhea and heartburn.  Genitourinary: Negative for dysuria, frequency and urgency.  Musculoskeletal: Negative for falls, joint pain and myalgias.  Skin: Negative for rash.  Neurological: Negative for dizziness, sensory change and headaches.  Endo/Heme/Allergies: Does not bruise/bleed easily.  Psychiatric/Behavioral: Negative for  depression, substance abuse and suicidal ideas. The patient is not nervous/anxious.     Objective:   Vitals:   07/05/18 1009  BP: 140/90  Pulse: 61  Temp: 98.2 F (36.8 C)  SpO2: 97%    Body mass index is 34.04 kg/m.   Physical Examination:  Physical Exam Vitals signs reviewed. Exam conducted with a chaperone present.  Constitutional:      Appearance: She is obese.  HENT:     Head: Normocephalic.     Right Ear: Tympanic membrane,  ear canal and external ear normal.     Left Ear: Tympanic membrane, ear canal and external ear normal.     Nose: Nose normal.     Mouth/Throat:     Mouth: Mucous membranes are moist.     Pharynx: No posterior oropharyngeal erythema.  Eyes:     Extraocular Movements: Extraocular movements intact.     Conjunctiva/sclera: Conjunctivae normal.  Neck:     Musculoskeletal: Normal range of motion and neck supple.  Cardiovascular:     Rate and Rhythm: Normal rate and regular rhythm.     Pulses: Normal pulses.     Heart sounds: Normal heart sounds.  Pulmonary:     Effort: Pulmonary effort is normal.     Breath sounds: Normal breath sounds.  Chest:     Breasts:        Right: Normal.        Left: Normal.  Abdominal:     General: Bowel sounds are normal.     Palpations: Abdomen is soft.  Genitourinary:    General: Normal vulva.     Labia:        Right: No rash or tenderness.        Left: No rash or tenderness.      Vagina: Normal. No vaginal discharge.     Cervix: Normal.     Adnexa: Right adnexa normal.     Rectum: Normal.  Musculoskeletal: Normal range of motion.  Lymphadenopathy:     Cervical: No cervical adenopathy.     Upper Body:     Right upper body: No supraclavicular, axillary or pectoral adenopathy.     Left upper body: No supraclavicular, axillary or pectoral adenopathy.     Lower Body: No right inguinal adenopathy. No left inguinal adenopathy.  Skin:    General: Skin is warm and dry.     Findings: No erythema.   Neurological:     Mental Status: She is alert and oriented to person, place, and time.  Psychiatric:        Mood and Affect: Mood normal.        Behavior: Behavior normal.        Thought Content: Thought content normal.    ASSESSMENT and PLAN:  Raaga was seen today for annual exam.  Diagnoses and all orders for this visit:  Encounter for preventative adult health care exam with abnormal findings -     CBC -     Comprehensive metabolic panel -     TSH -     Lipid panel -     Cytology - PAP( [place])  Essential hypertension  Iron deficiency anemia due to chronic blood loss -     CBC -     ferrous sulfate 324 (65 Fe) MG TBEC; Take 1 tablet (325 mg total) by mouth 2 (two) times daily with a meal.  Encounter for screening for human immunodeficiency virus (HIV) -     HIV antibody (with reflex)  Encounter for lipid screening for cardiovascular disease -     Lipid panel  Low grade squamous intraepithelial lesion on cytologic smear of cervix (LGSIL) -     Cytology - PAP( [place]) -     Ambulatory referral to Gynecology  Prediabetes -     Hemoglobin A1c  Screening examination for STD (sexually transmitted disease) -     HIV antibody (with reflex) -     Cervicovaginal ancillary only  BV (bacterial vaginosis) -  metroNIDAZOLE (METROGEL VAGINAL) 0.75 % vaginal gel; Place 1 Applicatorful vaginally at bedtime. 5days  Pap smear abnormality of cervix/human papillomavirus (HPV) positive -     Ambulatory referral to Gynecology   No problem-specific Assessment & Plan notes found for this encounter.     Problem List Items Addressed This Visit      Cardiovascular and Mediastinum   Essential hypertension     Other   Anemia   Relevant Medications   ferrous sulfate 324 (65 Fe) MG TBEC   Other Relevant Orders   CBC (Completed)   Low grade squamous intraepithelial lesion on cytologic smear of cervix (LGSIL)   Relevant Orders   Cytology - PAP( [place])  (Completed)   Ambulatory referral to Gynecology   Pap smear abnormality of cervix/human papillomavirus (HPV) positive   Relevant Orders   Ambulatory referral to Gynecology   Prediabetes   Relevant Orders   Hemoglobin A1c (Completed)    Other Visit Diagnoses    Encounter for preventative adult health care exam with abnormal findings    -  Primary   Relevant Orders   CBC (Completed)   Comprehensive metabolic panel (Completed)   TSH (Completed)   Lipid panel (Completed)   Cytology - PAP( [place]) (Completed)   Encounter for screening for human immunodeficiency virus (HIV)       Relevant Orders   HIV antibody (with reflex) (Completed)   Encounter for lipid screening for cardiovascular disease       Relevant Orders   Lipid panel (Completed)   Screening examination for STD (sexually transmitted disease)       Relevant Orders   HIV antibody (with reflex) (Completed)   Cervicovaginal ancillary only (Completed)   BV (bacterial vaginosis)       Relevant Medications   metroNIDAZOLE (METROGEL VAGINAL) 0.75 % vaginal gel      Follow up: Return in about 6 months (around 01/04/2019) for HTN.  Wilfred Lacy, NP

## 2018-07-06 LAB — COMPREHENSIVE METABOLIC PANEL
AG Ratio: 1.6 (calc) (ref 1.0–2.5)
ALT: 9 U/L (ref 6–29)
AST: 14 U/L (ref 10–30)
Albumin: 4.3 g/dL (ref 3.6–5.1)
Alkaline phosphatase (APISO): 72 U/L (ref 31–125)
BUN: 7 mg/dL (ref 7–25)
CO2: 27 mmol/L (ref 20–32)
Calcium: 9.4 mg/dL (ref 8.6–10.2)
Chloride: 105 mmol/L (ref 98–110)
Creat: 0.8 mg/dL (ref 0.50–1.10)
Globulin: 2.7 g/dL (calc) (ref 1.9–3.7)
Glucose, Bld: 106 mg/dL — ABNORMAL HIGH (ref 65–99)
Potassium: 4 mmol/L (ref 3.5–5.3)
Sodium: 141 mmol/L (ref 135–146)
Total Bilirubin: 0.4 mg/dL (ref 0.2–1.2)
Total Protein: 7 g/dL (ref 6.1–8.1)

## 2018-07-06 LAB — CBC
HCT: 33.6 % — ABNORMAL LOW (ref 35.0–45.0)
Hemoglobin: 11.1 g/dL — ABNORMAL LOW (ref 11.7–15.5)
MCH: 25.8 pg — ABNORMAL LOW (ref 27.0–33.0)
MCHC: 33 g/dL (ref 32.0–36.0)
MCV: 78 fL — ABNORMAL LOW (ref 80.0–100.0)
MPV: 10.5 fL (ref 7.5–12.5)
Platelets: 372 10*3/uL (ref 140–400)
RBC: 4.31 10*6/uL (ref 3.80–5.10)
RDW: 13.8 % (ref 11.0–15.0)
WBC: 5.9 10*3/uL (ref 3.8–10.8)

## 2018-07-06 LAB — LIPID PANEL
Cholesterol: 165 mg/dL (ref <200)
HDL: 53 mg/dL (ref >=50)
LDL Cholesterol (Calc): 98 mg/dL (calc)
Non-HDL Cholesterol (Calc): 112 mg/dL (calc) (ref <130)
Total CHOL/HDL Ratio: 3.1 (calc) (ref <5.0)
Triglycerides: 58 mg/dL (ref <150)

## 2018-07-06 LAB — CERVICOVAGINAL ANCILLARY ONLY
Bacterial vaginitis: POSITIVE — AB
Candida vaginitis: NEGATIVE
Chlamydia: NEGATIVE
Neisseria Gonorrhea: NEGATIVE
Trichomonas: NEGATIVE

## 2018-07-06 LAB — HIV ANTIBODY (ROUTINE TESTING W REFLEX): HIV 1&2 Ab, 4th Generation: NONREACTIVE

## 2018-07-06 LAB — HEMOGLOBIN A1C
Hgb A1c MFr Bld: 6.2 % of total Hgb — ABNORMAL HIGH (ref <5.7)
Mean Plasma Glucose: 131 (calc)
eAG (mmol/L): 7.3 (calc)

## 2018-07-06 LAB — TSH: TSH: 0.93 mIU/L

## 2018-07-07 DIAGNOSIS — R7303 Prediabetes: Secondary | ICD-10-CM | POA: Insufficient documentation

## 2018-07-07 DIAGNOSIS — R8789 Other abnormal findings in specimens from female genital organs: Secondary | ICD-10-CM | POA: Insufficient documentation

## 2018-07-07 DIAGNOSIS — R87612 Low grade squamous intraepithelial lesion on cytologic smear of cervix (LGSIL): Secondary | ICD-10-CM | POA: Insufficient documentation

## 2018-07-07 DIAGNOSIS — R87618 Other abnormal cytological findings on specimens from cervix uteri: Secondary | ICD-10-CM | POA: Insufficient documentation

## 2018-07-07 LAB — CYTOLOGY - PAP
HPV 16/18/45 genotyping: NEGATIVE
HPV: DETECTED — AB

## 2018-07-07 MED ORDER — FERROUS SULFATE 324 (65 FE) MG PO TBEC: 1.0000 | DELAYED_RELEASE_TABLET | Freq: Two times a day (BID) | ORAL | 0 refills | Status: DC

## 2018-07-07 MED ORDER — METRONIDAZOLE 0.75 % VA GEL: 1.0000 | Freq: Every day | VAGINAL | 0 refills | Status: DC

## 2018-07-07 NOTE — Addendum Note (Signed)
Addended by: Wilfred Lacy L on: 07/07/2018 12:52 PM   Modules accepted: Orders

## 2018-07-09 ENCOUNTER — Encounter: Payer: Self-pay | Admitting: Nurse Practitioner

## 2018-07-10 ENCOUNTER — Encounter (HOSPITAL_COMMUNITY): Payer: Self-pay

## 2018-07-10 ENCOUNTER — Emergency Department (HOSPITAL_COMMUNITY): Payer: Federal, State, Local not specified - PPO

## 2018-07-10 ENCOUNTER — Other Ambulatory Visit: Payer: Self-pay

## 2018-07-10 ENCOUNTER — Emergency Department (HOSPITAL_COMMUNITY)
Admission: EM | Admit: 2018-07-10 | Discharge: 2018-07-10 | Disposition: A | Discharge status: Home / Self Care | Payer: Federal, State, Local not specified - PPO | Attending: Emergency Medicine | Admitting: Emergency Medicine

## 2018-07-10 DIAGNOSIS — Z79899 Other long term (current) drug therapy: Secondary | ICD-10-CM | POA: Diagnosis not present

## 2018-07-10 DIAGNOSIS — R079 Chest pain, unspecified: Secondary | ICD-10-CM | POA: Diagnosis not present

## 2018-07-10 DIAGNOSIS — E876 Hypokalemia: Secondary | ICD-10-CM | POA: Insufficient documentation

## 2018-07-10 DIAGNOSIS — Z20828 Contact with and (suspected) exposure to other viral communicable diseases: Secondary | ICD-10-CM | POA: Insufficient documentation

## 2018-07-10 DIAGNOSIS — K219 Gastro-esophageal reflux disease without esophagitis: Secondary | ICD-10-CM | POA: Diagnosis not present

## 2018-07-10 DIAGNOSIS — F17210 Nicotine dependence, cigarettes, uncomplicated: Secondary | ICD-10-CM | POA: Diagnosis not present

## 2018-07-10 DIAGNOSIS — I10 Essential (primary) hypertension: Secondary | ICD-10-CM | POA: Diagnosis not present

## 2018-07-10 LAB — CBC
HCT: 37.3 % (ref 36.0–46.0)
Hemoglobin: 11.8 g/dL — ABNORMAL LOW (ref 12.0–15.0)
MCH: 25.7 pg — ABNORMAL LOW (ref 26.0–34.0)
MCHC: 31.6 g/dL (ref 30.0–36.0)
MCV: 81.1 fL (ref 80.0–100.0)
Platelets: 359 10*3/uL (ref 150–400)
RBC: 4.6 MIL/uL (ref 3.87–5.11)
RDW: 13.2 % (ref 11.5–15.5)
WBC: 8.8 10*3/uL (ref 4.0–10.5)
nRBC: 0 % (ref 0.0–0.2)

## 2018-07-10 LAB — I-STAT BETA HCG BLOOD, ED (NOT ORDERABLE): I-stat hCG, quantitative: <5 m[IU]/mL (ref <5)

## 2018-07-10 LAB — BASIC METABOLIC PANEL
Anion gap: 9 (ref 5–15)
BUN: 9 mg/dL (ref 6–20)
CO2: 29 mmol/L (ref 22–32)
Calcium: 9 mg/dL (ref 8.9–10.3)
Chloride: 100 mmol/L (ref 98–111)
Creatinine, Ser: 0.63 mg/dL (ref 0.44–1.00)
GFR calc Af Amer: >60 mL/min (ref >60)
GFR calc non Af Amer: >60 mL/min (ref >60)
Glucose, Bld: 89 mg/dL (ref 70–99)
Potassium: 2.8 mmol/L — ABNORMAL LOW (ref 3.5–5.1)
Sodium: 138 mmol/L (ref 135–145)

## 2018-07-10 LAB — TROPONIN I (HIGH SENSITIVITY): Troponin I (High Sensitivity): 2 ng/L (ref <18)

## 2018-07-10 MED ORDER — POTASSIUM CHLORIDE CRYS ER 20 MEQ PO TBCR
40.0000 meq | EXTENDED_RELEASE_TABLET | Freq: Once | ORAL | Status: AC
  Administered 2018-07-10: 40 meq via ORAL
  Filled 2018-07-10: qty 2

## 2018-07-10 MED ORDER — SUCRALFATE 1 G PO TABS: 1.0000 g | ORAL_TABLET | Freq: Three times a day (TID) | ORAL | 0 refills | Status: DC

## 2018-07-10 NOTE — ED Notes (Signed)
Did not attempt blood draw. Could not find good site.

## 2018-07-10 NOTE — ED Triage Notes (Addendum)
Pt c/o chest pain since Wednesday. Pt initially thought it was her GERD but has not gotten any better. Pt also concerned for bronchitis. Pt states 7 cases of COVID at work.

## 2018-07-10 NOTE — Discharge Instructions (Signed)
Continue your antacid medication, take the Carafate in addition to your medications.  Follow-up with your doctor next week to see if your symptoms have resolved.  A covid test was sent off for further analysis while you are in the emergency room.  The results should be available in the next 3 to 72 hours.

## 2018-07-10 NOTE — ED Notes (Signed)
No answer for lab draw at this time.

## 2018-07-10 NOTE — ED Provider Notes (Signed)
[facility] DEPT Provider Note   CSN: [id number] Arrival date & time: 07/10/18  1832    History   Chief Complaint Chief Complaint  Patient presents with  . Chest Pain    HPI Katrina Garcia is a 33 y.o. female.  HPI: A 33 year old patient with a history of hypertension presents for evaluation of chest pain. Initial onset of pain was approximately 3-6 hours ago. The patient's chest pain is sharp and is not worse with exertion. The patient's chest pain is middle- or left-sided, is not well-localized, is not described as heaviness/pressure/tightness and does not radiate to the arms/jaw/neck. The patient does not complain of nausea and denies diaphoresis. The patient has no history of stroke, has no history of peripheral artery disease, has not smoked in the past 90 days, denies any history of treated diabetes, has no relevant family history of coronary artery disease (first degree relative at less than age 5), has no history of hypercholesterolemia and does not have an elevated BMI (>=30).   HPI Sx started on Wednesday.  Felt like gerd but it persisted.  Then some sharp pain center of the chest.  It comes and goes.  Certain positions make it worse.  No fevers.  No cough.   Past Medical History:  Diagnosis Date  . Fibroid   . Fibroid uterus 07/29/2013   Left subserosal 3.5 x 3.1 x 2.5 Left Intramural 1.2 x 1.3 x 1.4    . HPV (human papilloma virus) infection   . MDD (major depressive disorder)   . Migraines   . Pregnancy induced hypertension   . PTSD (post-traumatic stress disorder)     Patient Active Problem List   Diagnosis Date Noted  . Prediabetes 07/07/2018  . Pap smear abnormality of cervix/human papillomavirus (HPV) positive 07/07/2018  . Low grade squamous intraepithelial lesion on cytologic smear of cervix (LGSIL) 07/07/2018  . Chronic fatigue 04/14/2017  . Anemia 03/17/2017  . Essential hypertension 01/22/2014  . Fibroid uterus 07/29/2013  .  MDD (major depressive disorder) 08/12/2012  . PTSD (post-traumatic stress disorder) 08/12/2012    Past Surgical History:  Procedure Laterality Date  . CESAREAN SECTION N/A 12/18/2013   Procedure: CESAREAN SECTION;  Surgeon: Truett Mainland, DO;  Location: [facility];  Service: Obstetrics;  Laterality: N/A;  . WISDOM TOOTH EXTRACTION       OB History    Gravida  1   Para  1   Term      Preterm  1   AB      Living  1     SAB      TAB      Ectopic      Multiple  0   Live Births  1            Home Medications    Prior to Admission medications   Medication Sig Start Date End Date Taking? Authorizing Provider  amLODipine (NORVASC) 5 MG tablet Take 1 tablet by mouth once daily 06/27/18   Nche, Charlene Brooke, NP  BIOTIN PO Take by mouth.    [provider]  clotrimazole (LOTRIMIN) 1 % cream Apply 1 application topically 2 (two) times daily. Patient not taking: Reported on 06/16/2018 03/17/17   Nche, Charlene Brooke, NP  [facility] 0.12-0.015 MG/24HR vaginal ring INSERT ONE RING VAGINALLY AND LEAVE IN PLACE FOR 3 CONSECUTIVE WEEKS, THEN REMOVE FOR 1 WEEK 04/11/18   Nche, Charlene Brooke, NP  ferrous sulfate 324 (65 Fe) MG TBEC Take  1 tablet (325 mg total) by mouth 2 (two) times daily with a meal. 07/07/18   Nche, Charlene Brooke, NP  FLUoxetine (PROZAC) 10 MG tablet Take 1 tablet (10 mg total) by mouth daily. 04/14/17   Nche, Charlene Brooke, NP  hydrochlorothiazide (HYDRODIURIL) 25 MG tablet Take 1 tablet by mouth once daily 06/27/18   Nche, Charlene Brooke, NP  metroNIDAZOLE (METROGEL VAGINAL) 0.75 % vaginal gel Place 1 Applicatorful vaginally at bedtime. 5days 07/07/18   Nche, Charlene Brooke, NP  mometasone (NASONEX) 50 MCG/ACT nasal spray Place 2 sprays into the nose daily. 03/17/17   Nche, Charlene Brooke, NP  pantoprazole (PROTONIX) 20 MG tablet Take 1 tablet (20 mg total) by mouth daily. 04/14/17   Nche, Charlene Brooke, NP  Prenatal Vit-Fe Fumarate-FA (PRENATAL MULTIVITAMIN) TABS tablet  Take 1 tablet by mouth daily at 12 noon.    [provider]  sucralfate (CARAFATE) 1 g tablet Take 1 tablet (1 g total) by mouth 4 (four) times daily -  with meals and at bedtime. 07/10/18   Dorie Rank, MD    Family History Family History  Problem Relation Age of Onset  . Hypertension Father   . Cancer Paternal Grandmother        breast cancer  . Hypertension Paternal Grandmother   . Breast cancer Paternal Grandmother     Social History Social History   Tobacco Use  . Smoking status: Current Every Day Smoker    Packs/day: 0.50    Types: Cigarettes  . Smokeless tobacco: Never Used  Substance Use Topics  . Alcohol use: No  . Drug use: No     Allergies   Patient has no known allergies.   Review of Systems Review of Systems  All other systems reviewed and are negative.    Physical Exam Updated Vital Signs BP (!) 158/106   Pulse (!) 50   Temp 98.6 F (37 C) (Oral)   Resp 15   Ht 1.549 m (5\' 1" )   Wt 83.9 kg   LMP 06/29/2018   SpO2 100%   BMI 34.96 kg/m   Physical Exam Vitals signs and nursing note reviewed.  Constitutional:      General: She is not in acute distress.    Appearance: She is well-developed.  HENT:     Head: Normocephalic and atraumatic.     Right Ear: External ear normal.     Left Ear: External ear normal.  Eyes:     General: No scleral icterus.       Right eye: No discharge.        Left eye: No discharge.     Conjunctiva/sclera: Conjunctivae normal.  Neck:     Musculoskeletal: Neck supple.     Trachea: No tracheal deviation.  Cardiovascular:     Rate and Rhythm: Normal rate and regular rhythm.  Pulmonary:     Effort: Pulmonary effort is normal. No respiratory distress.     Breath sounds: Normal breath sounds. No stridor. No wheezing or rales.  Abdominal:     General: Bowel sounds are normal. There is no distension.     Palpations: Abdomen is soft.     Tenderness: There is no abdominal tenderness. There is no guarding or  rebound.  Musculoskeletal:        General: No tenderness.  Skin:    General: Skin is warm and dry.     Findings: No rash.  Neurological:     Mental Status: She is alert.  Cranial Nerves: No cranial nerve deficit (no facial droop, extraocular movements intact, no slurred speech).     Sensory: No sensory deficit.     Motor: No abnormal muscle tone or seizure activity.     Coordination: Coordination normal.      ED Treatments / Results  Labs (all labs ordered are listed, but only abnormal results are displayed) Labs Reviewed  BASIC METABOLIC PANEL - Abnormal; Notable for the following components:      Result Value   Potassium 2.8 (*)    All other components within normal limits  CBC - Abnormal; Notable for the following components:   Hemoglobin 11.8 (*)    MCH 25.7 (*)    All other components within normal limits  NOVEL CORONAVIRUS, NAA (HOSPITAL ORDER, SEND-OUT TO REF LAB)  TROPONIN I (HIGH SENSITIVITY)  I-STAT BETA HCG BLOOD, ED ([facility], [facility], [facility] ONLY)  I-STAT BETA HCG BLOOD, ED (NOT ORDERABLE)    EKG EKG Interpretation  Date/Time:  "Sunday July 10 2018 18:47:02 EDT Ventricular Rate:  71 PR Interval:    QRS Duration: 107 QT Interval:  392 QTC Calculation: 426 R Axis:   33 Text Interpretation:  Sinus rhythm RSR' in V1 or V2, right VCD or RVH Borderline T abnormalities, anterior leads No significant change since last tracing Confirmed by Nethaniel Mattie ([id number]) on 07/10/2018 10:42:13 PM   Radiology Dg Chest 2 View  Result Date: 07/10/2018 CLINICAL DATA:  Chest pain. EXAM: CHEST - 2 VIEW COMPARISON:  11/04/2016 FINDINGS: The cardiomediastinal contours are normal. The lungs are clear. Pulmonary vasculature is normal. No consolidation, pleural effusion, or pneumothorax. No acute osseous abnormalities are seen. IMPRESSION: Negative radiographs of the chest. Electronically Signed   By: Melanie  Sanford M.D.   On: 07/10/2018 19:11    Procedures Procedures (including critical care  time)  Medications Ordered in ED Medications  potassium chloride SA (K-DUR) CR tablet 40 mEq (has no administration in time range)     Initial Impression / Assessment and Plan / ED Course  I have reviewed the triage vital signs and the nursing notes.  Pertinent labs & imaging results that were available during my care of the patient were reviewed by me and considered in my medical decision making (see chart for details).  Clinical Course as of Jul 09 2328  Sun Jul 10, 2018  2329 Labs reviewed.  Hypokalemia noted otherwise no significant abnormalities.   [JK]  2329 Chest x-ray negative for acute findings.   [JK]    Clinical Course User Index [JK] Melvena Vink, MD    HEAR Score: 2 Symptoms atypical for ACS.  Low risk heart score.  Troponin is negative.  Per the pathway patient is a candidate for early discharge.  Symptoms may be related to acid reflux.  Patient does take antacids.  I will have her continue that and add Carafate.  Discussed outpatient follow-up with her primary care doctor to recheck potassium and see if her chest pain is resolving with the antacid medications. Final Clinical Impressions(s) / ED Diagnoses   Final diagnoses:  Gastroesophageal reflux disease, esophagitis presence not specified  Chest pain, unspecified type  Hypokalemia    ED Discharge Orders         Ordered    sucralfate (CARAFATE) 1 g tablet  3 times daily with meals & bedtime     07" /05/20 2327           Dorie Rank, MD 07/10/18 2330

## 2018-07-12 LAB — NOVEL CORONAVIRUS, NAA (HOSP ORDER, SEND-OUT TO REF LAB; TAT 18-24 HRS): SARS-CoV-2, NAA: NOT DETECTED

## 2018-07-15 ENCOUNTER — Other Ambulatory Visit: Payer: Self-pay | Admitting: Nurse Practitioner

## 2018-07-15 DIAGNOSIS — I10 Essential (primary) hypertension: Secondary | ICD-10-CM

## 2018-07-15 DIAGNOSIS — E876 Hypokalemia: Secondary | ICD-10-CM

## 2018-07-16 ENCOUNTER — Other Ambulatory Visit: Payer: Self-pay | Admitting: Nurse Practitioner

## 2018-07-16 DIAGNOSIS — I10 Essential (primary) hypertension: Secondary | ICD-10-CM

## 2018-07-16 DIAGNOSIS — E876 Hypokalemia: Secondary | ICD-10-CM

## 2018-08-29 ENCOUNTER — Encounter: Payer: Self-pay | Admitting: Nurse Practitioner

## 2018-08-29 ENCOUNTER — Other Ambulatory Visit: Payer: Self-pay | Admitting: Nurse Practitioner

## 2018-08-29 DIAGNOSIS — K219 Gastro-esophageal reflux disease without esophagitis: Secondary | ICD-10-CM

## 2018-08-29 DIAGNOSIS — F339 Major depressive disorder, recurrent, unspecified: Secondary | ICD-10-CM

## 2018-08-29 MED ORDER — SUCRALFATE 1 G PO TABS: 1.0000 g | ORAL_TABLET | Freq: Three times a day (TID) | ORAL | 0 refills | Status: DC

## 2018-08-31 ENCOUNTER — Other Ambulatory Visit: Payer: Self-pay | Admitting: Nurse Practitioner

## 2018-08-31 DIAGNOSIS — Z30015 Encounter for initial prescription of vaginal ring hormonal contraceptive: Secondary | ICD-10-CM

## 2018-09-02 ENCOUNTER — Encounter: Payer: Self-pay | Admitting: Nurse Practitioner

## 2018-09-07 ENCOUNTER — Telehealth: Payer: Federal, State, Local not specified - PPO | Admitting: Nurse Practitioner

## 2018-11-11 ENCOUNTER — Other Ambulatory Visit: Payer: Self-pay

## 2018-11-11 DIAGNOSIS — Z20822 Contact with and (suspected) exposure to covid-19: Secondary | ICD-10-CM

## 2018-11-13 LAB — NOVEL CORONAVIRUS, NAA: SARS-CoV-2, NAA: NOT DETECTED

## 2018-11-24 ENCOUNTER — Other Ambulatory Visit: Payer: Self-pay

## 2018-11-24 DIAGNOSIS — Z20822 Contact with and (suspected) exposure to covid-19: Secondary | ICD-10-CM

## 2018-11-27 LAB — NOVEL CORONAVIRUS, NAA: SARS-CoV-2, NAA: DETECTED — AB

## 2018-12-03 ENCOUNTER — Other Ambulatory Visit: Payer: Self-pay | Admitting: Nurse Practitioner

## 2018-12-03 DIAGNOSIS — K219 Gastro-esophageal reflux disease without esophagitis: Secondary | ICD-10-CM

## 2018-12-08 ENCOUNTER — Other Ambulatory Visit: Payer: Self-pay

## 2018-12-08 DIAGNOSIS — Z20822 Contact with and (suspected) exposure to covid-19: Secondary | ICD-10-CM

## 2018-12-10 LAB — NOVEL CORONAVIRUS, NAA: SARS-CoV-2, NAA: NOT DETECTED

## 2018-12-14 ENCOUNTER — Ambulatory Visit (INDEPENDENT_AMBULATORY_CARE_PROVIDER_SITE_OTHER): Payer: Federal, State, Local not specified - PPO | Admitting: Nurse Practitioner

## 2018-12-14 ENCOUNTER — Ambulatory Visit: Payer: Federal, State, Local not specified - PPO | Admitting: Nurse Practitioner

## 2018-12-14 ENCOUNTER — Other Ambulatory Visit: Payer: Self-pay

## 2018-12-14 ENCOUNTER — Encounter: Payer: Self-pay | Admitting: Nurse Practitioner

## 2018-12-14 VITALS — BP 150/110 | HR 83 | Temp 96.3°F | Ht 61.0 in | Wt 180.8 lb

## 2018-12-14 DIAGNOSIS — I10 Essential (primary) hypertension: Secondary | ICD-10-CM | POA: Diagnosis not present

## 2018-12-14 DIAGNOSIS — Z3009 Encounter for other general counseling and advice on contraception: Secondary | ICD-10-CM | POA: Insufficient documentation

## 2018-12-14 DIAGNOSIS — R0789 Other chest pain: Secondary | ICD-10-CM

## 2018-12-14 DIAGNOSIS — R87612 Low grade squamous intraepithelial lesion on cytologic smear of cervix (LGSIL): Secondary | ICD-10-CM

## 2018-12-14 DIAGNOSIS — R7303 Prediabetes: Secondary | ICD-10-CM | POA: Diagnosis not present

## 2018-12-14 DIAGNOSIS — M940 Chondrocostal junction syndrome [Tietze]: Secondary | ICD-10-CM

## 2018-12-14 LAB — BASIC METABOLIC PANEL
BUN: 9 mg/dL (ref 6–23)
CO2: 27 mEq/L (ref 19–32)
Calcium: 8.9 mg/dL (ref 8.4–10.5)
Chloride: 105 mEq/L (ref 96–112)
Creatinine, Ser: 0.67 mg/dL (ref 0.40–1.20)
GFR: 102.51 mL/min (ref >60.00)
Glucose, Bld: 100 mg/dL — ABNORMAL HIGH (ref 70–99)
Potassium: 4 mEq/L (ref 3.5–5.1)
Sodium: 138 mEq/L (ref 135–145)

## 2018-12-14 LAB — POCT URINE PREGNANCY: Preg Test, Ur: NEGATIVE

## 2018-12-14 LAB — HEMOGLOBIN A1C: Hgb A1c MFr Bld: 6.2 % (ref 4.6–6.5)

## 2018-12-14 MED ORDER — AMLODIPINE BESYLATE 10 MG PO TABS: 10.0000 mg | ORAL_TABLET | Freq: Every day | ORAL | 1 refills | Status: DC

## 2018-12-14 MED ORDER — METHYLPREDNISOLONE 4 MG PO TBPK: ORAL_TABLET | ORAL | 0 refills | Status: DC

## 2018-12-14 NOTE — Assessment & Plan Note (Signed)
t is also imperative for you to schedule appt with GYN due to abnormal PAP with positive HPV on 06/08/2018.  Another referral to GYN

## 2018-12-14 NOTE — Assessment & Plan Note (Signed)
Also advised to avoid any combined hormonal contraception with uncontrolled BP. I recommended use of depoprovera injection today. She declined. Entered referral to GYN to discuss use of implant or IUD. Use condoms while waiting to see GYN.

## 2018-12-14 NOTE — Patient Instructions (Addendum)
Go to lab for blood draw Monitor your BP at home in AM F/up in 2weeks (video appt)  It is imperative to be compliant with BP medications in order to avoid possible complications like Stroke, heart attack, renal disease, and loss of vision.  You are no longer able to use a combined hormonal contraception due to uncontrolled BP. Use condoms while waiting to see GYN It is also imperative for you to schedule appt with GYN due to abnormal PAP on 06/08/2018. During this visit, you can also discuss use of IUD or implant for contraception.  Pantoprazole was last sent to your pharmacy 12/05/18.  Wear wrist watch on opposite wrist to help resolve rash. Continue hydrocortisone cream BID on rash x 1week.  Stop use of ibuprofen or advil or aleve or naproxen due to elevated BP and GERD. May use tylenol 500mg  2tabs every 8hrs as needed for pain.  Costochondritis Costochondritis is swelling and irritation (inflammation) of the tissue (cartilage) that connects your ribs to your breastbone (sternum). This causes pain in the front of your chest. Usually, the pain:  Starts gradually.  Is in more than one rib. This condition usually goes away on its own over time. Follow these instructions at home:  Do not do anything that makes your pain worse.  If directed, put ice on the painful area: ? Put ice in a plastic bag. ? Place a towel between your skin and the bag. ? Leave the ice on for 20 minutes, 2-3 times a day.  If directed, put heat on the affected area as often as told by your doctor. Use the heat source that your doctor tells you to use, such as a moist heat pack or a heating pad. ? Place a towel between your skin and the heat source. ? Leave the heat on for 20-30 minutes. ? Take off the heat if your skin turns bright red. This is very important if you cannot feel pain, heat, or cold. You may have a greater risk of getting burned.  Take over-the-counter and prescription medicines only as told by  your doctor.  Return to your normal activities as told by your doctor. Ask your doctor what activities are safe for you.  Keep all follow-up visits as told by your doctor. This is important. Contact a doctor if:  You have chills or a fever.  Your pain does not go away or it gets worse.  You have a cough that does not go away. Get help right away if:  You are short of breath. This information is not intended to replace advice given to you by your health care provider. Make sure you discuss any questions you have with your health care provider. Document Released: 06/10/2007 Document Revised: 01/06/2017 Document Reviewed: 04/17/2015 Elsevier Patient Education  2020 [employer].

## 2018-12-14 NOTE — Progress Notes (Addendum)
Subjective:  Patient ID: Katrina Garcia, female    DOB: 05-23-1985  Age: 33 y.o. MRN: [id number]  CC: Rash (rash on left wrist--itchy,swelling at the beginning but dry up now---used cortisone cream and alcohol to clean ), Pain (pt is c/o of painful/discomfort on chest area--worse take deep breath,exhale,lift arms up, cant lay on abd/4 days/ibuprofen. ), and Medication Refill (pt request refill for birth control--was pregnent 2 mo ago--loss the baby)  Chest Pain  This is a new problem. The current episode started in the past 7 days. The onset quality is gradual. The problem occurs constantly. The problem has been unchanged. The pain is present in the substernal region. The pain is moderate. The quality of the pain is described as dull. The pain does not radiate. Pertinent negatives include no abdominal pain, back pain, cough, dizziness, exertional chest pressure, irregular heartbeat, leg pain, lower extremity edema, palpitations, PND or shortness of breath. She has tried NSAIDs for the symptoms. The treatment provided mild relief. Risk factors include obesity, sedentary lifestyle, stress and lack of exercise.  Her past medical history is significant for anxiety/panic attacks and hypertension.  Pertinent negatives for past medical history include no aneurysm, no diabetes, no MI, no PE, no sleep apnea, no stimulant use, no strokes and no thyroid problem.  Pertinent negatives for family medical history include: no CAD.  Rash This is a new problem. The current episode started 1 to 4 weeks ago. The problem has been waxing and waning since onset. The affected locations include the left wrist. The rash is characterized by itchiness and scaling. Pertinent negatives include no congestion, cough, joint pain or shortness of breath. Past treatments include moisturizer and topical steroids. The treatment provided moderate relief. There is no history of allergies or eczema.  current entails repetitive use of upper arms,  lifting and pushing heavy objects.  HTN: uncontrolled Noncompliant with amlodipine and HCTZ. Admits to not taking medication daily. BP Readings from Last 3 Encounters:  12/14/18 (!) 150/110  07/10/18 (!) 182/129  07/05/18 140/90   Reviewed past Medical, Social and Family history today.  Outpatient Medications Prior to Visit  Medication Sig Dispense Refill  . BIOTIN PO Take by mouth.    . clotrimazole (LOTRIMIN) 1 % cream Apply 1 application topically 2 (two) times daily. 60 g 1  . FLUoxetine (PROZAC) 10 MG tablet Take 1 tablet by mouth once daily 90 tablet 0  . mometasone (NASONEX) 50 MCG/ACT nasal spray Place 2 sprays into the nose daily. 17 g 12  . pantoprazole (PROTONIX) 20 MG tablet Take 1 tablet by mouth once daily 90 tablet 0  . potassium chloride SA (K-DUR) 20 MEQ tablet Take 1 tablet by mouth once daily 30 tablet 0  . Prenatal Vit-Fe Fumarate-FA (PRENATAL MULTIVITAMIN) TABS tablet Take 1 tablet by mouth daily at 12 noon.    . sucralfate (CARAFATE) 1 g tablet Take 1 tablet (1 g total) by mouth 4 (four) times daily -  with meals and at bedtime. Need office visit for additional refills 28 tablet 0  . amLODipine (NORVASC) 5 MG tablet Take 1 tablet by mouth once daily 90 tablet 0  . hydrochlorothiazide (HYDRODIURIL) 25 MG tablet Take 1 tablet by mouth once daily 90 tablet 0  . metroNIDAZOLE (METROGEL VAGINAL) 0.75 % vaginal gel Place 1 Applicatorful vaginally at bedtime. 5days 70 g 0  . ELURYNG 0.12-0.015 MG/24HR vaginal ring INSERT ONE RING VAGINALLY AND LEAVE IN PLACE FOR 3 CONSECUTIVE WEEKS, THEN REMOVE FOR 1 WEEK (  Patient not taking: Reported on 12/14/2018) 3 each 0  . ferrous sulfate 324 (65 Fe) MG TBEC Take 1 tablet (325 mg total) by mouth 2 (two) times daily with a meal. (Patient not taking: Reported on 12/14/2018) 30 tablet 0   No facility-administered medications prior to visit.    ROS See HPI  Objective:  BP (!) 150/110   Pulse 83   Temp (!) 96.3 F (35.7 C)  (Tympanic)   Ht 5\' 1"  (1.549 m)   Wt 180 lb 12.8 oz (82 kg)   SpO2 98%   BMI 34.16 kg/m   BP Readings from Last 3 Encounters:  12/14/18 (!) 150/110  07/10/18 (!) 182/129  07/05/18 140/90   Wt Readings from Last 3 Encounters:  12/14/18 180 lb 12.8 oz (82 kg)  07/10/18 185 lb (83.9 kg)  07/05/18 185 lb (83.9 kg)   ECG: Normal Sinus Rhythm, no change compared to 07/2018.  Physical Exam Vitals signs reviewed.  Constitutional:      Appearance: She is obese.  Cardiovascular:     Rate and Rhythm: Normal rate and regular rhythm.     Pulses: Normal pulses.     Heart sounds: Normal heart sounds.  Pulmonary:     Effort: Pulmonary effort is normal.     Breath sounds: Normal breath sounds.  Chest:     Chest wall: Tenderness present.    Abdominal:     General: Bowel sounds are normal.     Palpations: Abdomen is soft.     Tenderness: There is no abdominal tenderness.  Musculoskeletal:     Right lower leg: No edema.     Left lower leg: No edema.  Skin:    General: Skin is warm and dry.     Findings: No erythema or rash.  Neurological:     Mental Status: She is alert and oriented to person, place, and time.  Psychiatric:        Mood and Affect: Mood normal.        Behavior: Behavior normal.        Thought Content: Thought content normal.    Assessment & Plan:  This visit occurred during the SARS-CoV-2 public health emergency.  Safety protocols were in place, including screening questions prior to the visit, additional usage of staff PPE, and extensive cleaning of exam room while observing appropriate contact time as indicated for disinfecting solutions.   Katrina Garcia was seen today for rash, pain and medication refill.  Diagnoses and all orders for this visit:  Atypical chest pain -     EKG 12-Lead  Essential hypertension -     EKG 12-Lead -     HTN_1 BMP -     amLODipine (NORVASC) 10 MG tablet; Take 1 tablet (10 mg total) by mouth at bedtime. -     hydrochlorothiazide  (HYDRODIURIL) 25 MG tablet; Take 1 tablet (25 mg total) by mouth daily.  Encounter for other general counseling and advice on contraception -     POCT urine pregnancy -     Ambulatory referral to Gynecology  Prediabetes -     Hemoglobin A1c  Low grade squamous intraepithelial lesion on cytologic smear of cervix (LGSIL) -     Ambulatory referral to Gynecology  Costochondritis, acute -     methylPREDNISolone (MEDROL DOSEPAK) 4 MG TBPK tablet; Take as directed on package   I have discontinued Aayushi Widener's EluRyng, metroNIDAZOLE, and ferrous sulfate. I have also changed her amLODipine and hydrochlorothiazide. Additionally, I am having  her start on methylPREDNISolone. Lastly, I am having her maintain her clotrimazole, mometasone, prenatal multivitamin, BIOTIN PO, potassium chloride SA, FLUoxetine, sucralfate, and pantoprazole.  Meds ordered this encounter  Medications  . amLODipine (NORVASC) 10 MG tablet    Sig: Take 1 tablet (10 mg total) by mouth at bedtime.    Dispense:  90 tablet    Refill:  1    Order Specific Question:   Supervising Provider    Answer:   Lucille Passy [[id number]]  . methylPREDNISolone (MEDROL DOSEPAK) 4 MG TBPK tablet    Sig: Take as directed on package    Dispense:  21 tablet    Refill:  0    Order Specific Question:   Supervising Provider    Answer:   Lucille Passy [[id number]]  . hydrochlorothiazide (HYDRODIURIL) 25 MG tablet    Sig: Take 1 tablet (25 mg total) by mouth daily.    Dispense:  90 tablet    Refill:  1    Order Specific Question:   Supervising Provider    Answer:   Lucille Passy [[id number]]    Problem List Items Addressed This Visit      Cardiovascular and Mediastinum   Essential hypertension    uncontrolled Noncompliant with amlodipine and HCTZ. Admits to not taking medication daily. BP Readings from Last 3 Encounters:  12/14/18 (!) 150/110  07/10/18 (!) 182/129  07/05/18 140/90   Resume amlodipine 10mg  daily Repeat BMP. F/up in 2week (video)  Advised about the importance of BP medication compliance in order to avoid possible complications like Stroke, heart attack, renal disease, and loss of vision. Also advised to avoid any combined hormonal contraception with uncontrolled BP.       Relevant Medications   amLODipine (NORVASC) 10 MG tablet   hydrochlorothiazide (HYDRODIURIL) 25 MG tablet   Other Relevant Orders   EKG 12-Lead (Completed)   HTN_1 BMP (Completed)     Other   Encounter for other general counseling and advice on contraception    Also advised to avoid any combined hormonal contraception with uncontrolled BP. I recommended use of depoprovera injection today. She declined. Entered referral to GYN to discuss use of implant or IUD. Use condoms while waiting to see GYN.       Relevant Orders   POCT urine pregnancy (Completed)   Ambulatory referral to Gynecology   Low grade squamous intraepithelial lesion on cytologic smear of cervix (LGSIL)    t is also imperative for you to schedule appt with GYN due to abnormal PAP with positive HPV on 06/08/2018.  Another referral to GYN      Relevant Orders   Ambulatory referral to Gynecology   Prediabetes   Relevant Orders   Hemoglobin A1c (Completed)    Other Visit Diagnoses    Atypical chest pain    -  Primary   Relevant Orders   EKG 12-Lead (Completed)   Costochondritis, acute       Relevant Medications   methylPREDNISolone (MEDROL DOSEPAK) 4 MG TBPK tablet      Follow-up: Return in about 2 weeks (around 12/28/2018) for HTN (video appt).  Wilfred Lacy, NP

## 2018-12-14 NOTE — Assessment & Plan Note (Addendum)
uncontrolled Noncompliant with amlodipine and HCTZ. Admits to not taking medication daily. BP Readings from Last 3 Encounters:  12/14/18 (!) 150/110  07/10/18 (!) 182/129  07/05/18 140/90   Resume amlodipine 10mg  daily Repeat BMP. F/up in 2week (video) Advised about the importance of BP medication compliance in order to avoid possible complications like Stroke, heart attack, renal disease, and loss of vision. Also advised to avoid any combined hormonal contraception with uncontrolled BP.

## 2018-12-15 ENCOUNTER — Encounter: Payer: Self-pay | Admitting: Nurse Practitioner

## 2018-12-15 MED ORDER — HYDROCHLOROTHIAZIDE 25 MG PO TABS: 25.0000 mg | ORAL_TABLET | Freq: Every day | ORAL | 1 refills | Status: DC

## 2018-12-15 NOTE — Addendum Note (Signed)
Addended by: Wilfred Lacy L on: 12/15/2018 07:19 PM   Modules accepted: Orders

## 2018-12-18 ENCOUNTER — Encounter: Payer: Self-pay | Admitting: Nurse Practitioner

## 2018-12-20 ENCOUNTER — Encounter: Payer: Self-pay | Admitting: Nurse Practitioner

## 2018-12-27 ENCOUNTER — Other Ambulatory Visit: Payer: Federal, State, Local not specified - PPO

## 2018-12-27 ENCOUNTER — Ambulatory Visit: Payer: Federal, State, Local not specified - PPO | Attending: Internal Medicine

## 2018-12-27 DIAGNOSIS — Z20822 Contact with and (suspected) exposure to covid-19: Secondary | ICD-10-CM

## 2018-12-27 DIAGNOSIS — Z20828 Contact with and (suspected) exposure to other viral communicable diseases: Secondary | ICD-10-CM | POA: Diagnosis not present

## 2018-12-28 ENCOUNTER — Other Ambulatory Visit: Payer: Self-pay

## 2018-12-28 ENCOUNTER — Telehealth (INDEPENDENT_AMBULATORY_CARE_PROVIDER_SITE_OTHER): Payer: Federal, State, Local not specified - PPO | Admitting: Nurse Practitioner

## 2018-12-28 ENCOUNTER — Encounter: Payer: Self-pay | Admitting: Nurse Practitioner

## 2018-12-28 VITALS — Ht 61.0 in

## 2018-12-28 DIAGNOSIS — I10 Essential (primary) hypertension: Secondary | ICD-10-CM | POA: Diagnosis not present

## 2018-12-28 DIAGNOSIS — F3341 Major depressive disorder, recurrent, in partial remission: Secondary | ICD-10-CM | POA: Diagnosis not present

## 2018-12-28 DIAGNOSIS — K219 Gastro-esophageal reflux disease without esophagitis: Secondary | ICD-10-CM

## 2018-12-28 MED ORDER — POTASSIUM CHLORIDE CRYS ER 20 MEQ PO TBCR: 20.0000 meq | EXTENDED_RELEASE_TABLET | Freq: Every day | ORAL | 1 refills | Status: DC

## 2018-12-28 MED ORDER — FLUOXETINE HCL 20 MG PO TABS: 20.0000 mg | ORAL_TABLET | Freq: Every day | ORAL | 1 refills | Status: DC

## 2018-12-28 MED ORDER — PANTOPRAZOLE SODIUM 20 MG PO TBEC: 20.0000 mg | DELAYED_RELEASE_TABLET | Freq: Every day | ORAL | 1 refills | Status: DC

## 2018-12-28 NOTE — Assessment & Plan Note (Signed)
Some improvement with fluoxetine.  Increased dose to 20mg  Entered referral to psychologist. F/up in 56month

## 2018-12-28 NOTE — Assessment & Plan Note (Signed)
Stable with amlodipine and hydrochlorothiazide. Encourage DASH diet and regular exercise.  F/up in 68month

## 2018-12-28 NOTE — Progress Notes (Signed)
Virtual Visit via Video Note  I connected with Katrina Garcia on 12/28/18 at  8:15 AM EST by a video enabled telemedicine application and verified that I am speaking with the correct person using two identifiers.  Location: Patient:home Provider:office Participants: patient and provider I discussed the limitations of evaluation and management by telemedicine and the availability of in person appointments. The patient expressed understanding and agreed to proceed.  [id number] up on BP--last reading was 12/24/18 =140/80. Carafate refill? pt will pick up rx for pantoprazole and BP machine today. FYI--pt is taking BP meds at night  History of Present Illness:  HTN: Some improvement in BP with amlodipine and hydrochlorothiazide. No BP reading today, BP last checked 3days ago. She denies any headache or dizziness or blurry vision.   Depression: Some improvement with prozac. Will like referral to therapist. Depression screen [facility] 2/9 12/28/2018 06/16/2018 04/14/2017  Decreased Interest 1 2 1   Down, Depressed, Hopeless 1 1 0  PHQ - 2 Score 2 3 1   Altered sleeping 0 3 3  Tired, decreased energy 3 2 3   Change in appetite 0 0 3  Feeling bad or failure about yourself  1 1 0  Trouble concentrating 0 0 0  Moving slowly or fidgety/restless 0 0 0  Suicidal thoughts 0 0 0  PHQ-9 Score 6 9 10   Difficult doing work/chores Very difficult - -   GAD 7 : Generalized Anxiety Score 12/28/2018 06/16/2018 04/14/2017 11/04/2016  Nervous, Anxious, on Edge 1 1 0 3  Control/stop worrying 2 2 1 3   Worry too much - different things 2 3 2 3   Trouble relaxing 0 2 3 3   Restless 0 0 0 2  Easily annoyed or irritable 1 1 0 3  Afraid - awful might happen 1 0 0 3  Total GAD 7 Score 7 9 6 20   Anxiety Difficulty Not difficult at all - - -   GERD: Uncontrolled with use of carafate prn. She did not get pantoprazole as prescribed. She still uses NSAIDs prn for chest wall pain.  Reviewed medication list and previous lab  results (BMP).  Observations/Objective: Physical Exam  Constitutional: She is oriented to person, place, and time. No distress.  Respiratory: Effort normal.  Neurological: She is alert and oriented to person, place, and time.   Assessment and Plan: Chirsty was seen today for follow-up.  Diagnoses and all orders for this visit:  Essential hypertension -     potassium chloride SA (KLOR-CON) 20 MEQ tablet; Take 1 tablet (20 mEq total) by mouth daily.  Recurrent major depressive disorder, in partial remission ([place]) -     Ambulatory referral to Psychology -     FLUoxetine (PROZAC) 20 MG tablet; Take 1 tablet (20 mg total) by mouth daily.  Gastroesophageal reflux disease without esophagitis -     pantoprazole (PROTONIX) 20 MG tablet; Take 1 tablet (20 mg total) by mouth daily.   Follow Up Instructions: See avs   I discussed the assessment and treatment plan with the patient. The patient was provided an opportunity to ask questions and all were answered. The patient agreed with the plan and demonstrated an understanding of the instructions.   The patient was advised to call back or seek an in-person evaluation if the symptoms worsen or if the condition fails to improve as anticipated.  Wilfred Lacy, NP

## 2018-12-28 NOTE — Patient Instructions (Signed)
Stop use of All NSAIDs Only take tylenol 500mg  every 6hrs for pain.  Resume pantoprazole daily. Stop carafate Use tums as needed in place of carafate.  Fluoxetine dose increased to 20mg  You will be contacted to schedule appt with psychologist.

## 2018-12-29 LAB — NOVEL CORONAVIRUS, NAA: SARS-CoV-2, NAA: NOT DETECTED

## 2019-01-02 ENCOUNTER — Ambulatory Visit: Payer: Federal, State, Local not specified - PPO | Admitting: Nurse Practitioner

## 2019-01-04 ENCOUNTER — Ambulatory Visit: Payer: Federal, State, Local not specified - PPO | Attending: Internal Medicine

## 2019-01-04 DIAGNOSIS — U071 COVID-19: Secondary | ICD-10-CM | POA: Diagnosis not present

## 2019-01-04 DIAGNOSIS — R238 Other skin changes: Secondary | ICD-10-CM | POA: Diagnosis not present

## 2019-01-05 LAB — NOVEL CORONAVIRUS, NAA: SARS-CoV-2, NAA: NOT DETECTED

## 2019-01-06 ENCOUNTER — Encounter: Payer: Self-pay | Admitting: Nurse Practitioner

## 2019-01-06 DIAGNOSIS — N760 Acute vaginitis: Secondary | ICD-10-CM

## 2019-01-07 ENCOUNTER — Other Ambulatory Visit: Payer: Self-pay | Admitting: Nurse Practitioner

## 2019-01-07 DIAGNOSIS — B9689 Other specified bacterial agents as the cause of diseases classified elsewhere: Secondary | ICD-10-CM

## 2019-01-09 ENCOUNTER — Ambulatory Visit: Payer: Federal, State, Local not specified - PPO | Attending: Internal Medicine

## 2019-01-09 DIAGNOSIS — Z20822 Contact with and (suspected) exposure to covid-19: Secondary | ICD-10-CM | POA: Diagnosis not present

## 2019-01-10 LAB — NOVEL CORONAVIRUS, NAA: SARS-CoV-2, NAA: NOT DETECTED

## 2019-01-10 MED ORDER — METRONIDAZOLE 0.75 % VA GEL: 1.0000 | Freq: Every day | VAGINAL | 0 refills | Status: DC

## 2019-01-11 ENCOUNTER — Ambulatory Visit: Payer: Federal, State, Local not specified - PPO | Admitting: Nurse Practitioner

## 2019-01-25 ENCOUNTER — Other Ambulatory Visit: Payer: Federal, State, Local not specified - PPO

## 2019-01-27 ENCOUNTER — Telehealth: Payer: Federal, State, Local not specified - PPO | Admitting: Nurse Practitioner

## 2019-01-30 ENCOUNTER — Encounter: Payer: Self-pay | Admitting: Obstetrics & Gynecology

## 2019-01-30 ENCOUNTER — Other Ambulatory Visit: Payer: Self-pay

## 2019-01-30 ENCOUNTER — Ambulatory Visit: Payer: Federal, State, Local not specified - PPO | Admitting: Obstetrics & Gynecology

## 2019-01-30 VITALS — BP 126/82 | Ht 61.5 in | Wt 173.0 lb

## 2019-01-30 DIAGNOSIS — Z01419 Encounter for gynecological examination (general) (routine) without abnormal findings: Secondary | ICD-10-CM | POA: Diagnosis not present

## 2019-01-30 DIAGNOSIS — Z1151 Encounter for screening for human papillomavirus (HPV): Secondary | ICD-10-CM | POA: Diagnosis not present

## 2019-01-30 DIAGNOSIS — Z30015 Encounter for initial prescription of vaginal ring hormonal contraceptive: Secondary | ICD-10-CM

## 2019-01-30 DIAGNOSIS — Z113 Encounter for screening for infections with a predominantly sexual mode of transmission: Secondary | ICD-10-CM | POA: Diagnosis not present

## 2019-01-30 DIAGNOSIS — E6609 Other obesity due to excess calories: Secondary | ICD-10-CM

## 2019-01-30 DIAGNOSIS — R87810 Cervical high risk human papillomavirus (HPV) DNA test positive: Secondary | ICD-10-CM

## 2019-01-30 DIAGNOSIS — R87610 Atypical squamous cells of undetermined significance on cytologic smear of cervix (ASC-US): Secondary | ICD-10-CM | POA: Diagnosis not present

## 2019-01-30 DIAGNOSIS — Z1159 Encounter for screening for other viral diseases: Secondary | ICD-10-CM | POA: Diagnosis not present

## 2019-01-30 DIAGNOSIS — Z6832 Body mass index (BMI) 32.0-32.9, adult: Secondary | ICD-10-CM

## 2019-01-30 DIAGNOSIS — R87612 Low grade squamous intraepithelial lesion on cytologic smear of cervix (LGSIL): Secondary | ICD-10-CM

## 2019-01-30 MED ORDER — ETONOGESTREL-ETHINYL ESTRADIOL 0.12-0.015 MG/24HR VA RING: 1.0000 | VAGINAL_RING | VAGINAL | 4 refills | Status: DC

## 2019-01-30 NOTE — Progress Notes (Signed)
Katrina Garcia 1985-06-10 [id number]   History:    34 y.o. G2P1A1L1 Single.  34 yo son.  RP: New patient presenting for annual gyn exam   HPI:  Menses regular every month, but heavy flow with dysmenorrhea.  No BTB.  No pelvic pain otherwise.  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  No colposcopy done.  No Pap test since then.  Sexually active, using condoms.  Breasts normal.  Body mass index 32.16.  Not very physically active.  Past medical history,surgical history, family history and social history were all reviewed and documented in the EPIC chart.  Gynecologic History Patient's last menstrual period was 01/12/2019.  Obstetric History OB History  Gravida Para Term Preterm AB Living  2 1   1 1 1   SAB TAB Ectopic Multiple Live Births        0 1    # Outcome Date GA Lbr Len/2nd Weight Sex Delivery Anes PTL Lv  2 Preterm 12/18/13 [redacted]w[redacted]d  5 lb 12.8 oz (2.631 kg) M CS-LTranv EPI  LIV  1 AB              ROS: A ROS was performed and pertinent positives and negatives are included in the history.  GENERAL: No fevers or chills. HEENT: No change in vision, no earache, sore throat or sinus congestion. NECK: No pain or stiffness. CARDIOVASCULAR: No chest pain or pressure. No palpitations. PULMONARY: No shortness of breath, cough or wheeze. GASTROINTESTINAL: No abdominal pain, nausea, vomiting or diarrhea, melena or bright red blood per rectum. GENITOURINARY: No urinary frequency, urgency, hesitancy or dysuria. MUSCULOSKELETAL: No joint or muscle pain, no back pain, no recent trauma. DERMATOLOGIC: No rash, no itching, no lesions. ENDOCRINE: No polyuria, polydipsia, no heat or cold intolerance. No recent change in weight. HEMATOLOGICAL: No anemia or easy bruising or bleeding. NEUROLOGIC: No headache, seizures, numbness, tingling or weakness. PSYCHIATRIC: No depression, no loss of interest in normal activity or change in sleep pattern.     Exam:   BP 126/82   Ht 5' 1.5" (1.562  m)   Wt 173 lb (78.5 kg)   LMP 01/12/2019 Comment: No birth control  BMI 32.16 kg/m   Body mass index is 32.16 kg/m.  General appearance : Well developed well nourished female. No acute distress HEENT: Eyes: no retinal hemorrhage or exudates,  Neck supple, trachea midline, no carotid bruits, no thyroidmegaly Lungs: Clear to auscultation, no rhonchi or wheezes, or rib retractions  Heart: Regular rate and rhythm, no murmurs or gallops Breast:Examined in sitting and supine position were symmetrical in appearance, no palpable masses or tenderness,  no skin retraction, no nipple inversion, no nipple discharge, no skin discoloration, no axillary or supraclavicular lymphadenopathy Abdomen: no palpable masses or tenderness, no rebound or guarding Extremities: no edema or skin discoloration or tenderness  Pelvic: Vulva: Normal             Vagina: No gross lesions or discharge  Cervix: No gross lesions or discharge. Pap/HPV HR/Gono-Chlam done.  Uterus  AV, normal size, shape and consistency, non-tender and mobile  Adnexa  Without masses or tenderness  Anus: Normal   Assessment/Plan:  34 y.o. female for annual exam   1. Encounter for routine gynecological examination with Papanicolaou smear of cervix Normal gynecologic exam.  Pap/HPV HR done.  Breast exam normal.  Health labs with family physician.  2. Encounter for initial prescription of vaginal ring hormonal contraceptive Contraceptive method counseling done.  Decision to use  NuvaRing.  No contraindication.  Usage, risks and benefits reviewed.  Prescription sent to pharmacy.  3. Low grade squamous intraepithelial lesion on cytologic smear of cervix (LGSIL) Pap test with high-risk HPV done today.  4. Cervical high risk human papillomavirus (HPV) DNA test positive Pap test with high-risk HPV done today.  5. Screen for STD (sexually transmitted disease) Strict condom use recommended. - Gono-Chlam done on Pap - HIV antibody (with  reflex) - RPR - Hepatitis C Antibody - Hepatitis B Surface AntiGEN  6. Class 1 obesity due to excess calories without serious comorbidity with body mass index (BMI) of 32.0 to 32.9 in adult Lower calorie/carb diet such as [employer].  Aerobic physical activities 5 times a week and light weightlifting every 2 days.  Other orders - etonogestrel-ethinyl estradiol (NUVARING) 0.12-0.015 MG/24HR vaginal ring; Place 1 each vaginally every 28 (twenty-eight) days. Insert vaginally and leave in place for 4 consecutive weeks, then switch rings.  Princess Bruins MD, 3:44 PM 01/30/2019

## 2019-01-31 LAB — HEPATITIS C ANTIBODY
Hepatitis C Ab: NONREACTIVE
SIGNAL TO CUT-OFF: 0.01

## 2019-01-31 LAB — SYPHILIS: RPR W/REFLEX TO RPR TITER AND TREPONEMAL ANTIBODIES, TRADITIONAL SCREENING AND DIAGNOSIS ALGORITHM: RPR Ser Ql: NONREACTIVE

## 2019-01-31 LAB — HEPATITIS B SURFACE ANTIGEN: Hepatitis B Surface Ag: NONREACTIVE

## 2019-01-31 LAB — HIV ANTIBODY (ROUTINE TESTING W REFLEX): HIV 1&2 Ab, 4th Generation: NONREACTIVE

## 2019-02-01 ENCOUNTER — Ambulatory Visit: Payer: Federal, State, Local not specified - PPO | Attending: Internal Medicine

## 2019-02-01 DIAGNOSIS — Z20822 Contact with and (suspected) exposure to covid-19: Secondary | ICD-10-CM | POA: Diagnosis not present

## 2019-02-01 LAB — PAP IG, CT-NG NAA, HPV HIGH-RISK
C. trachomatis RNA, TMA: NOT DETECTED
HPV DNA High Risk: DETECTED — AB
N. gonorrhoeae RNA, TMA: NOT DETECTED

## 2019-02-02 LAB — NOVEL CORONAVIRUS, NAA: SARS-CoV-2, NAA: NOT DETECTED

## 2019-02-03 ENCOUNTER — Encounter: Payer: Self-pay | Admitting: Obstetrics & Gynecology

## 2019-02-03 NOTE — Patient Instructions (Signed)
1. Encounter for routine gynecological examination with Papanicolaou smear of cervix Normal gynecologic exam.  Pap/HPV HR done.  Breast exam normal.  Health labs with family physician.  2. Encounter for initial prescription of vaginal ring hormonal contraceptive Contraceptive method counseling done.  Decision to use NuvaRing.  No contraindication.  Usage, risks and benefits reviewed.  Prescription sent to pharmacy.  3. Low grade squamous intraepithelial lesion on cytologic smear of cervix (LGSIL) Pap test with high-risk HPV done today.  4. Cervical high risk human papillomavirus (HPV) DNA test positive Pap test with high-risk HPV done today.  5. Screen for STD (sexually transmitted disease) Strict condom use recommended. - Gono-Chlam done on Pap - HIV antibody (with reflex) - RPR - Hepatitis C Antibody - Hepatitis B Surface AntiGEN  6. Class 1 obesity due to excess calories without serious comorbidity with body mass index (BMI) of 32.0 to 32.9 in adult Lower calorie/carb diet such as [employer].  Aerobic physical activities 5 times a week and light weightlifting every 2 days.  Other orders - etonogestrel-ethinyl estradiol (NUVARING) 0.12-0.015 MG/24HR vaginal ring; Place 1 each vaginally every 28 (twenty-eight) days. Insert vaginally and leave in place for 4 consecutive weeks, then switch rings.  Lauris, it was a pleasure meeting you today!  I will inform you of your results as soon as they are available.

## 2019-02-09 ENCOUNTER — Ambulatory Visit: Payer: Federal, State, Local not specified - PPO | Attending: Internal Medicine

## 2019-02-09 DIAGNOSIS — Z20822 Contact with and (suspected) exposure to covid-19: Secondary | ICD-10-CM

## 2019-02-10 LAB — NOVEL CORONAVIRUS, NAA: SARS-CoV-2, NAA: NOT DETECTED

## 2019-02-14 ENCOUNTER — Ambulatory Visit: Payer: Federal, State, Local not specified - PPO | Attending: Internal Medicine

## 2019-02-14 DIAGNOSIS — Z20822 Contact with and (suspected) exposure to covid-19: Secondary | ICD-10-CM | POA: Diagnosis not present

## 2019-02-15 ENCOUNTER — Ambulatory Visit: Payer: Federal, State, Local not specified - PPO | Admitting: Psychology

## 2019-02-15 LAB — NOVEL CORONAVIRUS, NAA: SARS-CoV-2, NAA: NOT DETECTED

## 2019-02-20 ENCOUNTER — Ambulatory Visit: Payer: Federal, State, Local not specified - PPO | Attending: Internal Medicine

## 2019-02-20 DIAGNOSIS — Z20822 Contact with and (suspected) exposure to covid-19: Secondary | ICD-10-CM

## 2019-02-21 LAB — NOVEL CORONAVIRUS, NAA: SARS-CoV-2, NAA: NOT DETECTED

## 2019-02-22 IMAGING — DX DG LUMBAR SPINE COMPLETE 4+V
5 series · 5 of 5 positions shown · non-contrast
Comparison: CT abdomen pelvis of 06/18/2010

CLINICAL DATA: Left-sided low back pain and left leg pain for 3
months, worse recently, no injury

EXAM:
LUMBAR SPINE - COMPLETE 4+ VIEW

[l-spine ap]
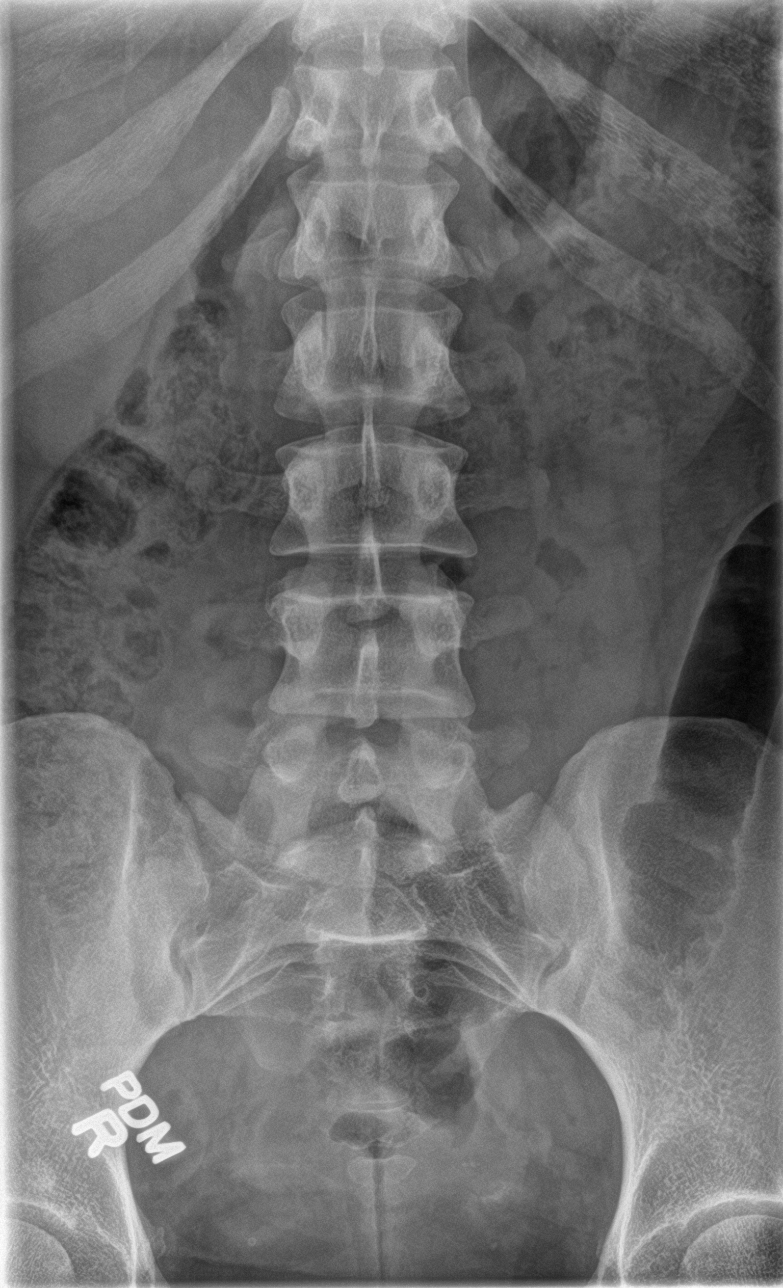

[l-spine obl (1 of 2)]
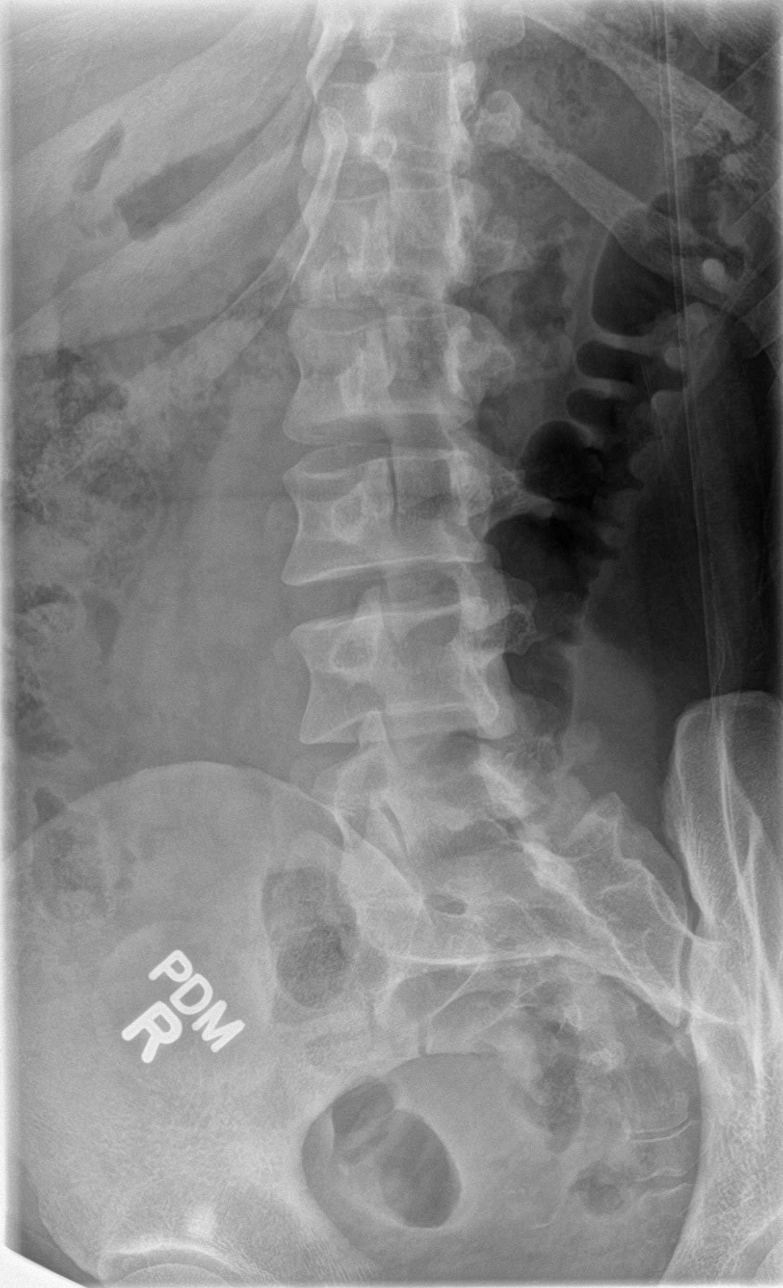

[l-spine obl (2 of 2)]
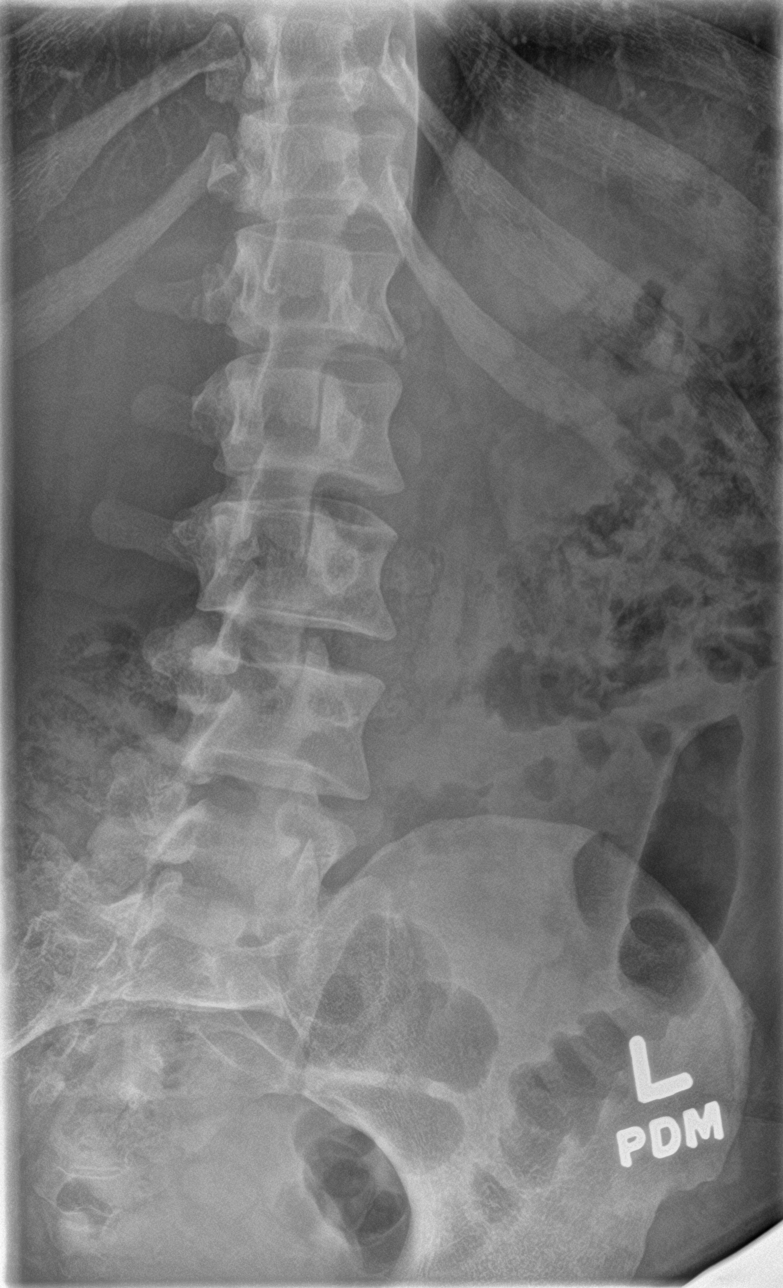

[l-spine lat]
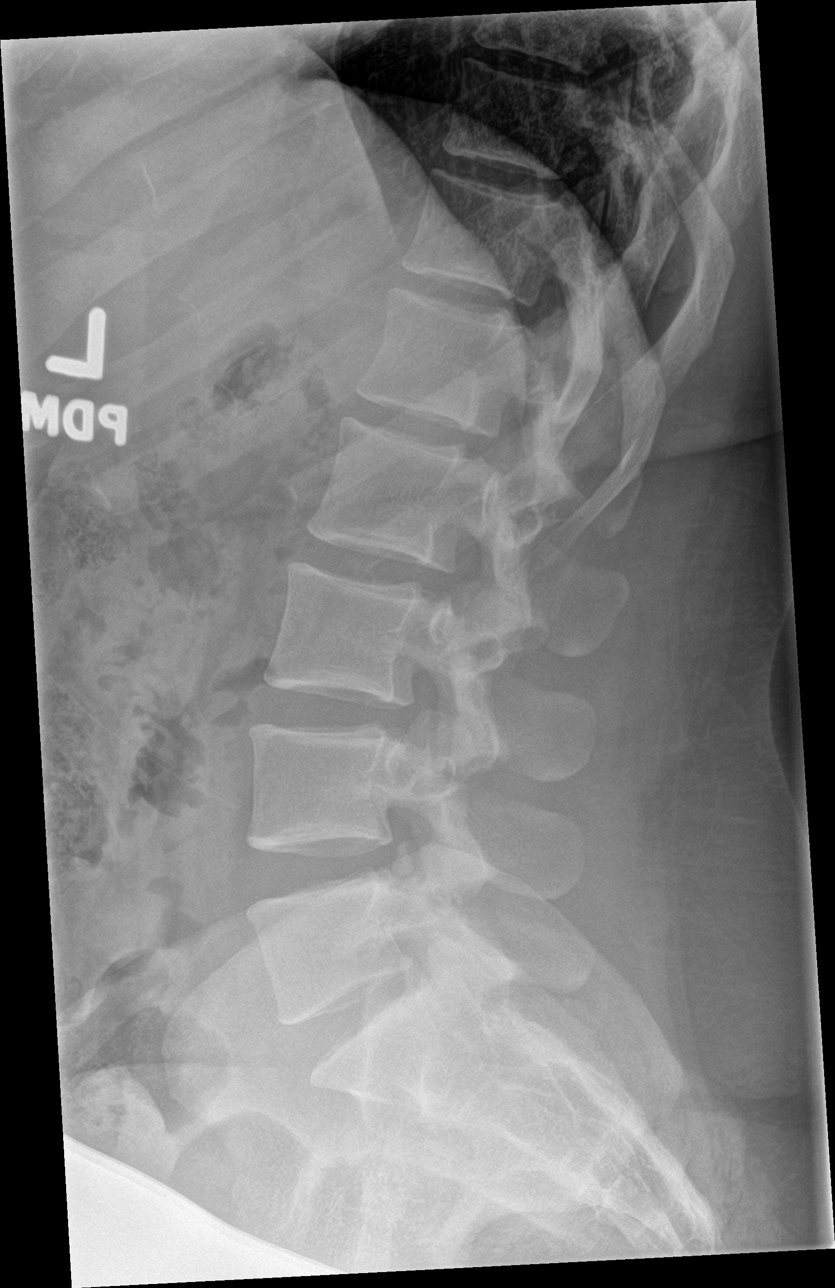

[l-spine spot]
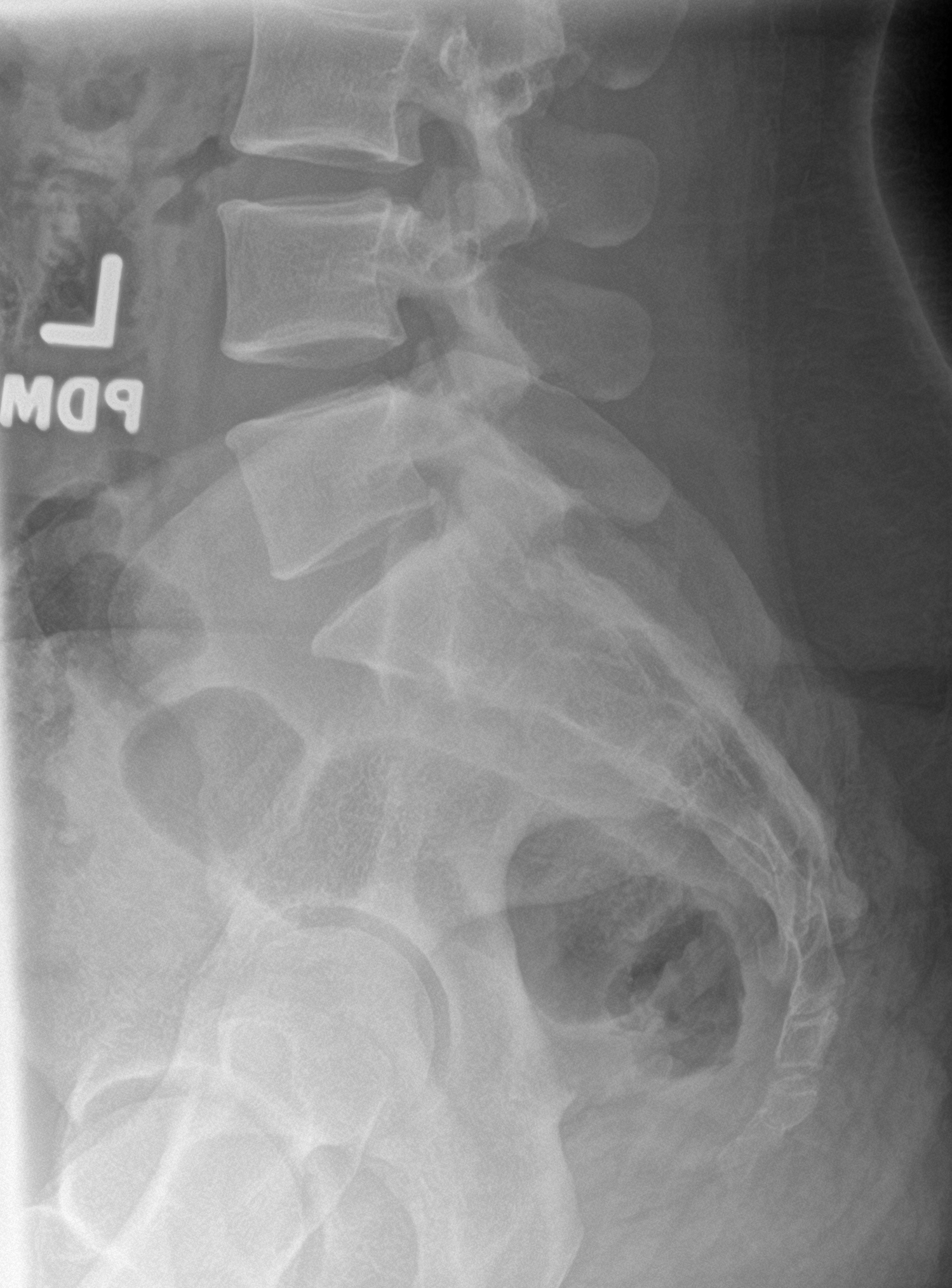

[5 of 5 positions shown; findings below may reference images not displayed]

FINDINGS: The lumbar vertebrae are in normal alignment with normal
intervertebral disc spaces. No compression deformity is seen. The SI
joints are corticated. The facet joints are unremarkable.
IMPRESSION: Normal alignment of the lumbar spine. Normal intervertebral disc
spaces.

## 2019-02-23 DIAGNOSIS — M7732 Calcaneal spur, left foot: Secondary | ICD-10-CM | POA: Diagnosis not present

## 2019-02-23 DIAGNOSIS — S93492A Sprain of other ligament of left ankle, initial encounter: Secondary | ICD-10-CM | POA: Diagnosis not present

## 2019-03-01 ENCOUNTER — Ambulatory Visit: Payer: Federal, State, Local not specified - PPO

## 2019-03-01 ENCOUNTER — Ambulatory Visit: Payer: Federal, State, Local not specified - PPO | Attending: Internal Medicine

## 2019-03-01 DIAGNOSIS — Z20822 Contact with and (suspected) exposure to covid-19: Secondary | ICD-10-CM

## 2019-03-02 LAB — NOVEL CORONAVIRUS, NAA: SARS-CoV-2, NAA: NOT DETECTED

## 2019-03-08 ENCOUNTER — Ambulatory Visit: Payer: Federal, State, Local not specified - PPO | Attending: Internal Medicine

## 2019-03-08 DIAGNOSIS — Z20822 Contact with and (suspected) exposure to covid-19: Secondary | ICD-10-CM

## 2019-03-09 LAB — NOVEL CORONAVIRUS, NAA: SARS-CoV-2, NAA: NOT DETECTED

## 2019-03-22 ENCOUNTER — Ambulatory Visit: Payer: Federal, State, Local not specified - PPO | Attending: Internal Medicine

## 2019-03-22 DIAGNOSIS — Z20822 Contact with and (suspected) exposure to covid-19: Secondary | ICD-10-CM

## 2019-03-23 LAB — NOVEL CORONAVIRUS, NAA: SARS-CoV-2, NAA: NOT DETECTED

## 2019-04-05 ENCOUNTER — Ambulatory Visit: Payer: Federal, State, Local not specified - PPO | Attending: Internal Medicine

## 2019-04-05 DIAGNOSIS — Z20822 Contact with and (suspected) exposure to covid-19: Secondary | ICD-10-CM | POA: Diagnosis not present

## 2019-04-06 LAB — NOVEL CORONAVIRUS, NAA: SARS-CoV-2, NAA: NOT DETECTED

## 2019-04-17 ENCOUNTER — Other Ambulatory Visit: Payer: Federal, State, Local not specified - PPO

## 2019-04-19 ENCOUNTER — Ambulatory Visit: Payer: Federal, State, Local not specified - PPO | Attending: Internal Medicine

## 2019-04-19 DIAGNOSIS — Z20822 Contact with and (suspected) exposure to covid-19: Secondary | ICD-10-CM | POA: Diagnosis not present

## 2019-04-20 LAB — NOVEL CORONAVIRUS, NAA: SARS-CoV-2, NAA: NOT DETECTED

## 2019-04-20 LAB — SARS-COV-2, NAA 2 DAY TAT

## 2019-04-26 ENCOUNTER — Ambulatory Visit: Payer: Federal, State, Local not specified - PPO | Attending: Internal Medicine

## 2019-04-26 DIAGNOSIS — Z20822 Contact with and (suspected) exposure to covid-19: Secondary | ICD-10-CM | POA: Diagnosis not present

## 2019-04-27 LAB — SARS-COV-2, NAA 2 DAY TAT

## 2019-04-27 LAB — NOVEL CORONAVIRUS, NAA: SARS-CoV-2, NAA: NOT DETECTED

## 2019-05-03 ENCOUNTER — Ambulatory Visit: Payer: Federal, State, Local not specified - PPO | Attending: Internal Medicine

## 2019-05-03 DIAGNOSIS — Z20822 Contact with and (suspected) exposure to covid-19: Secondary | ICD-10-CM

## 2019-05-05 LAB — NOVEL CORONAVIRUS, NAA

## 2019-05-10 ENCOUNTER — Ambulatory Visit: Payer: Federal, State, Local not specified - PPO | Attending: Internal Medicine

## 2019-05-10 DIAGNOSIS — Z113 Encounter for screening for infections with a predominantly sexual mode of transmission: Secondary | ICD-10-CM | POA: Diagnosis not present

## 2019-05-10 DIAGNOSIS — N898 Other specified noninflammatory disorders of vagina: Secondary | ICD-10-CM | POA: Diagnosis not present

## 2019-05-10 DIAGNOSIS — Z20822 Contact with and (suspected) exposure to covid-19: Secondary | ICD-10-CM

## 2019-05-10 DIAGNOSIS — B373 Candidiasis of vulva and vagina: Secondary | ICD-10-CM | POA: Diagnosis not present

## 2019-05-11 LAB — NOVEL CORONAVIRUS, NAA: SARS-CoV-2, NAA: NOT DETECTED

## 2019-05-11 LAB — SARS-COV-2, NAA 2 DAY TAT

## 2019-05-17 ENCOUNTER — Other Ambulatory Visit: Payer: Federal, State, Local not specified - PPO

## 2019-05-29 ENCOUNTER — Other Ambulatory Visit: Payer: Federal, State, Local not specified - PPO

## 2019-05-31 ENCOUNTER — Ambulatory Visit: Payer: Federal, State, Local not specified - PPO | Attending: Internal Medicine

## 2019-05-31 DIAGNOSIS — Z20822 Contact with and (suspected) exposure to covid-19: Secondary | ICD-10-CM

## 2019-06-01 LAB — NOVEL CORONAVIRUS, NAA: SARS-CoV-2, NAA: NOT DETECTED

## 2019-06-01 LAB — SARS-COV-2, NAA 2 DAY TAT

## 2019-06-07 ENCOUNTER — Ambulatory Visit: Payer: Federal, State, Local not specified - PPO | Attending: Internal Medicine

## 2019-06-07 DIAGNOSIS — Z20822 Contact with and (suspected) exposure to covid-19: Secondary | ICD-10-CM

## 2019-06-08 LAB — SARS-COV-2, NAA 2 DAY TAT

## 2019-06-08 LAB — NOVEL CORONAVIRUS, NAA: SARS-CoV-2, NAA: NOT DETECTED

## 2019-06-15 ENCOUNTER — Ambulatory Visit: Payer: Federal, State, Local not specified - PPO | Attending: Internal Medicine

## 2019-06-15 ENCOUNTER — Other Ambulatory Visit: Payer: Self-pay

## 2019-06-15 DIAGNOSIS — Z20822 Contact with and (suspected) exposure to covid-19: Secondary | ICD-10-CM

## 2019-06-16 LAB — NOVEL CORONAVIRUS, NAA: SARS-CoV-2, NAA: NOT DETECTED

## 2019-06-16 LAB — SARS-COV-2, NAA 2 DAY TAT

## 2019-06-26 ENCOUNTER — Ambulatory Visit: Payer: Federal, State, Local not specified - PPO | Attending: Internal Medicine

## 2019-06-26 DIAGNOSIS — Z20822 Contact with and (suspected) exposure to covid-19: Secondary | ICD-10-CM | POA: Diagnosis not present

## 2019-06-27 LAB — NOVEL CORONAVIRUS, NAA: SARS-CoV-2, NAA: NOT DETECTED

## 2019-06-27 LAB — SARS-COV-2, NAA 2 DAY TAT

## 2019-07-26 ENCOUNTER — Ambulatory Visit: Payer: Federal, State, Local not specified - PPO | Attending: Internal Medicine

## 2019-07-26 DIAGNOSIS — Z20822 Contact with and (suspected) exposure to covid-19: Secondary | ICD-10-CM

## 2019-07-27 LAB — SARS-COV-2, NAA 2 DAY TAT

## 2019-07-27 LAB — NOVEL CORONAVIRUS, NAA: SARS-CoV-2, NAA: NOT DETECTED

## 2019-08-07 DIAGNOSIS — K219 Gastro-esophageal reflux disease without esophagitis: Secondary | ICD-10-CM | POA: Diagnosis not present

## 2019-08-07 DIAGNOSIS — I10 Essential (primary) hypertension: Secondary | ICD-10-CM | POA: Diagnosis not present

## 2019-08-07 DIAGNOSIS — R0789 Other chest pain: Secondary | ICD-10-CM | POA: Diagnosis not present

## 2019-08-07 DIAGNOSIS — Z79899 Other long term (current) drug therapy: Secondary | ICD-10-CM | POA: Diagnosis not present

## 2019-08-07 DIAGNOSIS — R079 Chest pain, unspecified: Secondary | ICD-10-CM | POA: Diagnosis not present

## 2019-08-07 DIAGNOSIS — R7989 Other specified abnormal findings of blood chemistry: Secondary | ICD-10-CM | POA: Diagnosis not present

## 2019-08-11 ENCOUNTER — Telehealth: Payer: Self-pay | Admitting: Nurse Practitioner

## 2019-08-11 NOTE — Telephone Encounter (Signed)
Patient scheduled an appointment via MyChart to be seen on 08/14/2019 for chest pains. Called patient, no answer. Left a detailed message to give the office a call back so that she can be triaged/transferred to our nurse line or go to the nearest Urgent Care or ED to be evaluated.   Thank you

## 2019-08-14 ENCOUNTER — Ambulatory Visit: Payer: Federal, State, Local not specified - PPO | Admitting: Nurse Practitioner

## 2019-08-14 NOTE — Telephone Encounter (Signed)
Pt no showed today but tried calling her no answer, told her to call our office and update [place] or to reschedule.

## 2019-09-07 ENCOUNTER — Telehealth: Payer: Self-pay | Admitting: Nurse Practitioner

## 2019-09-07 ENCOUNTER — Encounter: Payer: Self-pay | Admitting: Nurse Practitioner

## 2019-09-07 NOTE — Telephone Encounter (Signed)
Looks like she was seen in ED on 08/07/2019. C Thank you

## 2019-09-07 NOTE — Telephone Encounter (Signed)
Pt was no show for appt 08/14/19. 2nd occurrence. Burley Saver and Marita [place] attempted to call pt per notes due to appt notes suggesting chest pain. Letter mailed.  PCP,  Please reply back with corresponding letter matching appropriate follow up needs.  A - No follow up necessary B - Follow up urgent - locate patient immediately to schedule appointment. C - Follow up necessary. Contact patient and schedule visit w/in 7 days. D - Follow up necessary. Contact patient and schedule visit w/in 2-4 weeks.  E - Follow up necessary. Contact patient and schedule visit w/in 3 months.

## 2019-09-26 NOTE — Telephone Encounter (Signed)
Mail was returned.I called ph # on file but mailbox is full. If pt calls back please obtain correct/updated address information so I can resend.

## 2019-10-13 ENCOUNTER — Encounter: Payer: Self-pay | Admitting: Nurse Practitioner

## 2019-10-16 ENCOUNTER — Other Ambulatory Visit: Payer: Self-pay

## 2019-10-16 DIAGNOSIS — I10 Essential (primary) hypertension: Secondary | ICD-10-CM

## 2019-10-16 MED ORDER — HYDROCHLOROTHIAZIDE 25 MG PO TABS: 25.0000 mg | ORAL_TABLET | Freq: Every day | ORAL | 0 refills | Status: DC

## 2019-10-16 NOTE — Telephone Encounter (Signed)
Patient requesting refill on pending medication. Last OV 10/14/2018 last refill 12/15/18 patient aware that she is due for follow up appointment and agrees to call and schedule appointment.

## 2019-11-15 DIAGNOSIS — R059 Cough, unspecified: Secondary | ICD-10-CM | POA: Diagnosis not present

## 2019-11-15 DIAGNOSIS — J069 Acute upper respiratory infection, unspecified: Secondary | ICD-10-CM | POA: Diagnosis not present

## 2020-01-02 ENCOUNTER — Other Ambulatory Visit: Payer: Self-pay | Admitting: Nurse Practitioner

## 2020-01-02 DIAGNOSIS — I10 Essential (primary) hypertension: Secondary | ICD-10-CM

## 2020-01-10 ENCOUNTER — Ambulatory Visit: Payer: Federal, State, Local not specified - PPO | Admitting: Nurse Practitioner

## 2020-01-10 DIAGNOSIS — Z0289 Encounter for other administrative examinations: Secondary | ICD-10-CM

## 2020-01-10 NOTE — Telephone Encounter (Signed)
Patient will need to schedule an appointment before any additional refills will be filled.

## 2020-01-11 ENCOUNTER — Telehealth: Payer: Self-pay | Admitting: Nurse Practitioner

## 2020-01-11 NOTE — Telephone Encounter (Signed)
I have the letter you put on my desk yesterday for no show. This was the patient's 3rd no show (01/27/19, 08/14/19, 01/07/20). Do you want to dismiss or another chance? I have placed copy of the letter mailed 09/07/19 on your desk to review.

## 2020-01-12 NOTE — Telephone Encounter (Signed)
Proceed to dismissal. Thank you

## 2020-01-15 ENCOUNTER — Encounter: Payer: Self-pay | Admitting: Nurse Practitioner

## 2020-01-15 NOTE — Telephone Encounter (Signed)
Letter sent.

## 2020-01-17 DIAGNOSIS — Z20822 Contact with and (suspected) exposure to covid-19: Secondary | ICD-10-CM | POA: Diagnosis not present

## 2020-01-24 DIAGNOSIS — Z20822 Contact with and (suspected) exposure to covid-19: Secondary | ICD-10-CM | POA: Diagnosis not present

## 2020-01-29 DIAGNOSIS — U071 COVID-19: Secondary | ICD-10-CM | POA: Diagnosis not present

## 2020-02-01 DIAGNOSIS — B9689 Other specified bacterial agents as the cause of diseases classified elsewhere: Secondary | ICD-10-CM | POA: Diagnosis not present

## 2020-02-01 DIAGNOSIS — Z113 Encounter for screening for infections with a predominantly sexual mode of transmission: Secondary | ICD-10-CM | POA: Diagnosis not present

## 2020-02-01 DIAGNOSIS — N760 Acute vaginitis: Secondary | ICD-10-CM | POA: Diagnosis not present

## 2020-02-01 DIAGNOSIS — N898 Other specified noninflammatory disorders of vagina: Secondary | ICD-10-CM | POA: Diagnosis not present

## 2020-02-01 DIAGNOSIS — Z3202 Encounter for pregnancy test, result negative: Secondary | ICD-10-CM | POA: Diagnosis not present

## 2020-02-05 ENCOUNTER — Other Ambulatory Visit: Payer: Self-pay | Admitting: Nurse Practitioner

## 2020-02-05 DIAGNOSIS — I10 Essential (primary) hypertension: Secondary | ICD-10-CM

## 2020-02-19 ENCOUNTER — Other Ambulatory Visit: Payer: Self-pay | Admitting: Obstetrics & Gynecology

## 2020-03-12 DIAGNOSIS — Z8616 Personal history of COVID-19: Secondary | ICD-10-CM | POA: Diagnosis not present

## 2020-04-19 DIAGNOSIS — B373 Candidiasis of vulva and vagina: Secondary | ICD-10-CM | POA: Diagnosis not present

## 2020-04-19 DIAGNOSIS — S93492A Sprain of other ligament of left ankle, initial encounter: Secondary | ICD-10-CM | POA: Diagnosis not present

## 2020-04-19 DIAGNOSIS — I10 Essential (primary) hypertension: Secondary | ICD-10-CM | POA: Diagnosis not present

## 2020-07-12 ENCOUNTER — Ambulatory Visit: Payer: Federal, State, Local not specified - PPO | Admitting: Obstetrics & Gynecology

## 2020-07-12 DIAGNOSIS — Z0289 Encounter for other administrative examinations: Secondary | ICD-10-CM

## 2020-07-16 DIAGNOSIS — N765 Ulceration of vagina: Secondary | ICD-10-CM | POA: Diagnosis not present

## 2020-07-16 DIAGNOSIS — Z113 Encounter for screening for infections with a predominantly sexual mode of transmission: Secondary | ICD-10-CM | POA: Diagnosis not present

## 2020-07-16 DIAGNOSIS — N760 Acute vaginitis: Secondary | ICD-10-CM | POA: Diagnosis not present

## 2020-07-29 DIAGNOSIS — Z20822 Contact with and (suspected) exposure to covid-19: Secondary | ICD-10-CM | POA: Diagnosis not present

## 2020-07-29 DIAGNOSIS — I10 Essential (primary) hypertension: Secondary | ICD-10-CM | POA: Diagnosis not present

## 2020-08-17 DIAGNOSIS — Z20822 Contact with and (suspected) exposure to covid-19: Secondary | ICD-10-CM | POA: Diagnosis not present

## 2020-09-23 DIAGNOSIS — I10 Essential (primary) hypertension: Secondary | ICD-10-CM | POA: Diagnosis not present

## 2020-09-23 DIAGNOSIS — R110 Nausea: Secondary | ICD-10-CM | POA: Diagnosis not present

## 2020-09-23 DIAGNOSIS — Z23 Encounter for immunization: Secondary | ICD-10-CM | POA: Diagnosis not present

## 2020-09-23 DIAGNOSIS — F329 Major depressive disorder, single episode, unspecified: Secondary | ICD-10-CM | POA: Diagnosis not present

## 2020-09-23 DIAGNOSIS — F4310 Post-traumatic stress disorder, unspecified: Secondary | ICD-10-CM | POA: Diagnosis not present

## 2020-10-07 DIAGNOSIS — E663 Overweight: Secondary | ICD-10-CM | POA: Diagnosis not present

## 2020-10-07 DIAGNOSIS — Z0000 Encounter for general adult medical examination without abnormal findings: Secondary | ICD-10-CM | POA: Diagnosis not present

## 2021-03-25 ENCOUNTER — Ambulatory Visit (INDEPENDENT_AMBULATORY_CARE_PROVIDER_SITE_OTHER): Payer: Federal, State, Local not specified - PPO | Admitting: Obstetrics & Gynecology

## 2021-03-25 ENCOUNTER — Other Ambulatory Visit: Payer: Self-pay

## 2021-03-25 ENCOUNTER — Encounter: Payer: Self-pay | Admitting: Obstetrics & Gynecology

## 2021-03-25 ENCOUNTER — Other Ambulatory Visit (HOSPITAL_COMMUNITY)
Admission: RE | Admit: 2021-03-25 | Discharge: 2021-03-25 | Disposition: A | Discharge status: Home / Self Care | Payer: Federal, State, Local not specified - PPO | Source: Ambulatory Visit | Attending: Obstetrics & Gynecology | Admitting: Obstetrics & Gynecology

## 2021-03-25 VITALS — BP 118/70 | HR 68 | Resp 16 | Ht 61.25 in | Wt 170.0 lb

## 2021-03-25 DIAGNOSIS — Z30015 Encounter for initial prescription of vaginal ring hormonal contraceptive: Secondary | ICD-10-CM | POA: Diagnosis not present

## 2021-03-25 DIAGNOSIS — Z01419 Encounter for gynecological examination (general) (routine) without abnormal findings: Secondary | ICD-10-CM | POA: Insufficient documentation

## 2021-03-25 DIAGNOSIS — R87610 Atypical squamous cells of undetermined significance on cytologic smear of cervix (ASC-US): Secondary | ICD-10-CM | POA: Diagnosis not present

## 2021-03-25 DIAGNOSIS — R87810 Cervical high risk human papillomavirus (HPV) DNA test positive: Secondary | ICD-10-CM

## 2021-03-25 DIAGNOSIS — Z113 Encounter for screening for infections with a predominantly sexual mode of transmission: Secondary | ICD-10-CM | POA: Diagnosis not present

## 2021-03-25 MED ORDER — ETONOGESTREL-ETHINYL ESTRADIOL 0.12-0.015 MG/24HR VA RING: 1.0000 | VAGINAL_RING | VAGINAL | 4 refills | Status: DC

## 2021-03-25 NOTE — Progress Notes (Signed)
? ? ?Katrina Garcia 04/19/1985 [id number] ? ? ?History:    36 y.o. G2P1A1L1 Single.  65 yo son. ?  ?RP: Established patient presenting for annual gyn exam  ?  ?HPI:  Menses regular every month, but heavy flow.  LMP normal 03/16/2021.  Would like to start back on Nuvaring.  No BTB.  No pelvic pain.  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  No colposcopy done. Pap 01/30/2019 ASCUS/HPV HR Neg.  Pap reflex today with HR HPV/Gono-Chlam.  Would like full STI screen.   Breasts normal. Rt breast US Neg 10/2016. Body mass index 31.86.  Not very physically active, working night shift. ? ? ?Past medical history,surgical history, family history and social history were all reviewed and documented in the EPIC chart. ? ?Gynecologic History ?Patient's last menstrual period was 03/16/2021 (exact date). ? ?Obstetric History ?OB History  ?Gravida Para Term Preterm AB Living  ?'2 1   1 1 1  '$ ?SAB IAB Ectopic Multiple Live Births  ?      0 1  ?  ?# Outcome Date GA Lbr Len/2nd Weight Sex Delivery Anes PTL Lv  ?2 Preterm 12/18/13 [redacted]w[redacted]d 5 lb 12.8 oz (2.631 kg) M CS-LTranv EPI  LIV  ?1 AB           ? ? ? ?ROS: A ROS was performed and pertinent positives and negatives are included in the history. ? GENERAL: No fevers or chills. HEENT: No change in vision, no earache, sore throat or sinus congestion. NECK: No pain or stiffness. CARDIOVASCULAR: No chest pain or pressure. No palpitations. PULMONARY: No shortness of breath, cough or wheeze. GASTROINTESTINAL: No abdominal pain, nausea, vomiting or diarrhea, melena or bright red blood per rectum. GENITOURINARY: No urinary frequency, urgency, hesitancy or dysuria. MUSCULOSKELETAL: No joint or muscle pain, no back pain, no recent trauma. DERMATOLOGIC: No rash, no itching, no lesions. ENDOCRINE: No polyuria, polydipsia, no heat or cold intolerance. No recent change in weight. HEMATOLOGICAL: No anemia or easy bruising or bleeding. NEUROLOGIC: No headache, seizures, numbness, tingling or  weakness. PSYCHIATRIC: No depression, no loss of interest in normal activity or change in sleep pattern.  ?  ? ?Exam: ? ? ?BP 118/70   Pulse 68   Resp 16   Ht 5' 1.25" (1.556 m)   Wt 170 lb (77.1 kg)   LMP 03/16/2021 (Exact Date)   BMI 31.86 kg/m?  ? ?Body mass index is 31.86 kg/m?. ? ?General appearance : Well developed well nourished female. No acute distress ?HEENT: Eyes: no retinal hemorrhage or exudates,  Neck supple, trachea midline, no carotid bruits, no thyroidmegaly ?Lungs: Clear to auscultation, no rhonchi or wheezes, or rib retractions  ?Heart: Regular rate and rhythm, no murmurs or gallops ?Breast:Examined in sitting and supine position were symmetrical in appearance, no palpable masses or tenderness,  no skin retraction, no nipple inversion, no nipple discharge, no skin discoloration, no axillary or supraclavicular lymphadenopathy ?Abdomen: no palpable masses or tenderness, no rebound or guarding ?Extremities: no edema or skin discoloration or tenderness ? ?Pelvic: Vulva: Normal ?            Vagina: No gross lesions or discharge ? Cervix: No gross lesions or discharge.  Pap/HPV HR -Gono-Chlam. ? Uterus  AV, normal size, shape and consistency, non-tender and mobile ? Adnexa  Without masses or tenderness ? Anus: Normal ? ? ?Assessment/Plan:  36y.o. female for annual exam  ? ?1. Encounter for routine gynecological examination with Papanicolaou smear of  cervix ?Menses regular every month, but heavy flow.  LMP normal 03/16/2021.  Would like to start back on Nuvaring.  No BTB.  No pelvic pain.  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  No colposcopy done. Pap 01/30/2019 ASCUS/HPV HR Neg.  Pap reflex today with HR HPV/Gono-Chlam.  Would like full STI screen.   Breasts normal. Rt breast US Neg 10/2016. Body mass index 31.86.  Not very physically active, working night shift. ?- Cytology - PAP( [place] [facility]) ? ?2. ASCUS with positive high risk HPV cervical ?Pap/HPV HR today. ? ?3.  Encounter for initial prescription of vaginal ring hormonal contraceptive ?Desires restarting on Nuvaring.  Usage/benefits/risks known.  Prescription sent to pharmacy. ? ?4. Screen for STD (sexually transmitted disease) ?- HIV antibody (with reflex) ?- RPR ?- Hepatitis B Surface AntiGEN ?- Hepatitis C Antibody ?- Gono-Chlam on Pap ? ?Other orders ?- albuterol (VENTOLIN HFA) 108 (90 Base) MCG/ACT inhaler; Inhale into the lungs as needed. (Patient not taking: Reported on 03/25/2021) ?- etonogestrel-ethinyl estradiol (NUVARING) 0.12-0.015 MG/24HR vaginal ring; Place 1 each vaginally every 28 (twenty-eight) days. Insert vaginally and leave in place for 3 consecutive weeks, then remove for 1 week.  ? ?Princess Bruins MD, 11:19 AM 03/25/2021 ? ?  ?

## 2021-03-26 LAB — HEPATITIS C ANTIBODY
Hepatitis C Ab: NONREACTIVE
SIGNAL TO CUT-OFF: 0.1 (ref <1.00)

## 2021-03-26 LAB — HEPATITIS B SURFACE ANTIGEN: Hepatitis B Surface Ag: NONREACTIVE

## 2021-03-26 LAB — RPR: RPR Ser Ql: NONREACTIVE

## 2021-03-26 LAB — HIV ANTIBODY (ROUTINE TESTING W REFLEX): HIV 1&2 Ab, 4th Generation: NONREACTIVE

## 2021-03-28 LAB — CYTOLOGY - PAP
Chlamydia: NEGATIVE
Comment: NEGATIVE
Comment: NEGATIVE
Comment: NORMAL
Diagnosis: UNDETERMINED — AB
High risk HPV: POSITIVE — AB
Neisseria Gonorrhea: NEGATIVE

## 2021-04-01 ENCOUNTER — Other Ambulatory Visit: Payer: Self-pay

## 2021-04-01 DIAGNOSIS — R87610 Atypical squamous cells of undetermined significance on cytologic smear of cervix (ASC-US): Secondary | ICD-10-CM

## 2021-04-28 ENCOUNTER — Ambulatory Visit: Payer: Federal, State, Local not specified - PPO | Admitting: Obstetrics & Gynecology

## 2021-05-06 ENCOUNTER — Ambulatory Visit: Payer: Federal, State, Local not specified - PPO | Admitting: Obstetrics & Gynecology

## 2021-05-09 ENCOUNTER — Telehealth: Payer: Self-pay | Admitting: *Deleted

## 2021-05-09 NOTE — Telephone Encounter (Signed)
Patient called stating pharmacy said insurance won't approve her nuvaring  0.12-0.015 vaginal ring., next refill due for pick up 05/27/21 Patient states she only picked up 1 ring and no 3 rings. I called [facility] they show 3 ring picked up on 3/he called her insurance company 03/25/21. Patient called insurance to see if override could be done. It appears patient was using nuvaring weekly and not leaving ring in for 28 days. I re-read directions on Rx for patient and told her that other option would be to use GoodRx coupon online.  ?

## 2021-05-21 ENCOUNTER — Encounter: Payer: Self-pay | Admitting: Obstetrics & Gynecology

## 2021-05-21 ENCOUNTER — Other Ambulatory Visit (HOSPITAL_COMMUNITY)
Admission: RE | Admit: 2021-05-21 | Discharge: 2021-05-21 | Disposition: A | Discharge status: Home / Self Care | Payer: Federal, State, Local not specified - PPO | Source: Ambulatory Visit | Attending: Obstetrics & Gynecology | Admitting: Obstetrics & Gynecology

## 2021-05-21 ENCOUNTER — Ambulatory Visit (INDEPENDENT_AMBULATORY_CARE_PROVIDER_SITE_OTHER): Payer: Federal, State, Local not specified - PPO | Admitting: Obstetrics & Gynecology

## 2021-05-21 DIAGNOSIS — R87610 Atypical squamous cells of undetermined significance on cytologic smear of cervix (ASC-US): Secondary | ICD-10-CM

## 2021-05-21 DIAGNOSIS — R87810 Cervical high risk human papillomavirus (HPV) DNA test positive: Secondary | ICD-10-CM | POA: Insufficient documentation

## 2021-05-21 DIAGNOSIS — B977 Papillomavirus as the cause of diseases classified elsewhere: Secondary | ICD-10-CM | POA: Diagnosis not present

## 2021-05-21 DIAGNOSIS — N870 Mild cervical dysplasia: Secondary | ICD-10-CM | POA: Diagnosis not present

## 2021-05-21 DIAGNOSIS — N72 Inflammatory disease of cervix uteri: Secondary | ICD-10-CM | POA: Diagnosis not present

## 2021-05-21 NOTE — Progress Notes (Signed)
    Katrina Garcia [birth year] [id number]        35 y.o.  G2P0111   RP: ASCUS/HPV HR Positive for Colposcopy  HPI: ASCUS/HPV HR Positive on 03/25/21.  Had LGSIL in 06/2018 with HPV HR Pos, no Colpo done.   OB History  Gravida Para Term Preterm AB Living  '2 1   1 1 1  '$ SAB IAB Ectopic Multiple Live Births        0 1    # Outcome Date GA Lbr Len/2nd Weight Sex Delivery Anes PTL Lv  2 Preterm 12/18/13 [redacted]w[redacted]d 5 lb 12.8 oz (2.631 kg) M CS-LTranv EPI  LIV  1 AB             Past medical history,surgical history, problem list, medications, allergies, family history and social history were all reviewed and documented in the EPIC chart.   Directed ROS with pertinent positives and negatives documented in the history of present illness/assessment and plan.  Exam:  Vitals:   05/21/21 1459  BP: 122/80   General appearance:  Normal  Colposcopy Procedure Note Katrina Traynor5/17/2023  Indications: ASCUS with HR HPV positive  Procedure Details  The risks and benefits of the procedure and Written informed consent obtained.  Speculum placed in vagina and excellent visualization of cervix achieved, cervix swabbed x 3 with acetic acid solution.  Findings:  Cervix colposcopy: Physical Exam Genitourinary:       Vaginal colposcopy: Normal  Vulvar colposcopy: Normal  Perirectal colposcopy: Normal  The cervix was sprayed with Hurricane before performing the cervical biopsies.  Specimens: HPV 16-18-45.  Cervical Bx at 3 and 6 O'Clock.  Complications: No complication, good hemostasis with Silver Nitrate and Moncel. . Plan:  Management per Cervical Bx and HPV 16-18-45 results.   Assessment/Plan:  36y.o. [id number]  1. ASCUS with positive high risk HPV cervica-+- ASCUS/HPV HR Positive on 03/25/21.  Had LGSIL in 06/2018 with HPV HR Pos, no Colpo done.  Counseling on ASCUS and HR HPV Pos.  Colposcopy procedure reviewed.  Colpo findings discussed with patient.  Post procedure precautions.   Management per results. - Colposcopy - Surgical pathology( [facility]/ [facility]) - Cervicovaginal ancillary only( [facility])  Other orders - UNABLE TO FIND; Med Name: acne medication   Counseling and management of ASCUS and HR HPV for 15 minutes.  MPrincess BruinsMD, 3:36 PM 05/21/2021

## 2021-05-23 LAB — CERVICOVAGINAL ANCILLARY ONLY
Comment: NEGATIVE
Comment: NEGATIVE
HPV 16: NEGATIVE
HPV 18 / 45: POSITIVE — AB

## 2021-05-23 LAB — SURGICAL PATHOLOGY

## 2021-05-28 ENCOUNTER — Encounter: Payer: Self-pay | Admitting: Obstetrics & Gynecology

## 2021-06-04 DIAGNOSIS — F331 Major depressive disorder, recurrent, moderate: Secondary | ICD-10-CM | POA: Diagnosis not present

## 2021-06-18 DIAGNOSIS — F331 Major depressive disorder, recurrent, moderate: Secondary | ICD-10-CM | POA: Diagnosis not present

## 2021-07-31 ENCOUNTER — Other Ambulatory Visit: Payer: Self-pay

## 2021-07-31 DIAGNOSIS — R87810 Cervical high risk human papillomavirus (HPV) DNA test positive: Secondary | ICD-10-CM

## 2021-07-31 DIAGNOSIS — N870 Mild cervical dysplasia: Secondary | ICD-10-CM

## 2021-10-01 DIAGNOSIS — G43839 Menstrual migraine, intractable, without status migrainosus: Secondary | ICD-10-CM | POA: Diagnosis not present

## 2021-10-07 DIAGNOSIS — F4323 Adjustment disorder with mixed anxiety and depressed mood: Secondary | ICD-10-CM | POA: Diagnosis not present

## 2021-10-13 DIAGNOSIS — F4323 Adjustment disorder with mixed anxiety and depressed mood: Secondary | ICD-10-CM | POA: Diagnosis not present

## 2021-10-20 DIAGNOSIS — F4323 Adjustment disorder with mixed anxiety and depressed mood: Secondary | ICD-10-CM | POA: Diagnosis not present

## 2021-10-27 DIAGNOSIS — F4323 Adjustment disorder with mixed anxiety and depressed mood: Secondary | ICD-10-CM | POA: Diagnosis not present

## 2021-11-03 DIAGNOSIS — F4323 Adjustment disorder with mixed anxiety and depressed mood: Secondary | ICD-10-CM | POA: Diagnosis not present

## 2021-11-13 ENCOUNTER — Ambulatory Visit: Payer: Federal, State, Local not specified - PPO | Admitting: Obstetrics & Gynecology

## 2021-11-13 DIAGNOSIS — Z0289 Encounter for other administrative examinations: Secondary | ICD-10-CM

## 2021-11-17 DIAGNOSIS — F4323 Adjustment disorder with mixed anxiety and depressed mood: Secondary | ICD-10-CM | POA: Diagnosis not present

## 2021-11-24 ENCOUNTER — Encounter: Payer: Self-pay | Admitting: Obstetrics & Gynecology

## 2021-12-03 ENCOUNTER — Telehealth: Payer: Self-pay | Admitting: Obstetrics & Gynecology

## 2021-12-03 NOTE — Telephone Encounter (Signed)
Patient no show for her November 19 colposcopy appointment. Message was left on November 13 and 15 and letter mailed on November 20 for patient to call and reschedule her missed appointment. Patient has not rescheduled.

## 2021-12-15 NOTE — Telephone Encounter (Signed)
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   Left message to call Sharee Pimple, RN at [place], [phone], OPT 5.

## 2022-01-07 ENCOUNTER — Other Ambulatory Visit: Payer: Self-pay

## 2022-01-07 ENCOUNTER — Encounter (HOSPITAL_BASED_OUTPATIENT_CLINIC_OR_DEPARTMENT_OTHER): Payer: Self-pay

## 2022-01-07 ENCOUNTER — Emergency Department (HOSPITAL_BASED_OUTPATIENT_CLINIC_OR_DEPARTMENT_OTHER)
Admission: EM | Admit: 2022-01-07 | Discharge: 2022-01-07 | Discharge status: Against Medical Advice | Payer: Federal, State, Local not specified - PPO | Attending: Emergency Medicine | Admitting: Emergency Medicine

## 2022-01-07 DIAGNOSIS — R519 Headache, unspecified: Secondary | ICD-10-CM | POA: Diagnosis not present

## 2022-01-07 DIAGNOSIS — Z5321 Procedure and treatment not carried out due to patient leaving prior to being seen by health care provider: Secondary | ICD-10-CM | POA: Diagnosis not present

## 2022-01-07 DIAGNOSIS — R110 Nausea: Secondary | ICD-10-CM | POA: Diagnosis not present

## 2022-01-07 NOTE — ED Triage Notes (Signed)
Pt c/o migraine HA w associated nausea onset 8pm. Hx migraines, usually associated w menstrual cycle, states she's "about to come off."

## 2022-01-07 NOTE — ED Notes (Signed)
Pt called X 3 for vital reassessment. No answer. Pt will be moved OTF.

## 2022-01-23 DIAGNOSIS — R519 Headache, unspecified: Secondary | ICD-10-CM | POA: Diagnosis not present

## 2022-01-23 DIAGNOSIS — F32A Depression, unspecified: Secondary | ICD-10-CM | POA: Diagnosis not present

## 2022-01-29 DIAGNOSIS — E669 Obesity, unspecified: Secondary | ICD-10-CM | POA: Diagnosis not present

## 2022-01-29 DIAGNOSIS — R7303 Prediabetes: Secondary | ICD-10-CM | POA: Diagnosis not present

## 2022-01-29 DIAGNOSIS — I10 Essential (primary) hypertension: Secondary | ICD-10-CM | POA: Diagnosis not present

## 2022-01-29 DIAGNOSIS — F329 Major depressive disorder, single episode, unspecified: Secondary | ICD-10-CM | POA: Diagnosis not present

## 2022-02-19 NOTE — Telephone Encounter (Signed)
Left message to call Sharee Pimple, RN at [facility], [phone], opt 5.  MyChart message sent.

## 2022-03-05 NOTE — Telephone Encounter (Signed)
Letter signed by Dr. Dellis Filbert and mailed to address on file.   Encounter closed.

## 2022-03-05 NOTE — Telephone Encounter (Signed)
No response from patient.   Letter pended. Copy to Dr. Dellis Filbert to review.

## 2022-03-05 NOTE — Addendum Note (Signed)
Addended by: Burnice Logan on: 03/05/2022 04:38 PM   Modules accepted: Orders

## 2022-03-31 DIAGNOSIS — R509 Fever, unspecified: Secondary | ICD-10-CM | POA: Diagnosis not present

## 2022-03-31 DIAGNOSIS — J069 Acute upper respiratory infection, unspecified: Secondary | ICD-10-CM | POA: Diagnosis not present

## 2022-03-31 DIAGNOSIS — J029 Acute pharyngitis, unspecified: Secondary | ICD-10-CM | POA: Diagnosis not present

## 2022-03-31 DIAGNOSIS — Z03818 Encounter for observation for suspected exposure to other biological agents ruled out: Secondary | ICD-10-CM | POA: Diagnosis not present

## 2022-04-22 ENCOUNTER — Inpatient Hospital Stay (HOSPITAL_COMMUNITY): Payer: Federal, State, Local not specified - PPO

## 2022-04-22 ENCOUNTER — Inpatient Hospital Stay (HOSPITAL_COMMUNITY)
Admission: AD | Admit: 2022-04-22 | Discharge: 2022-04-22 | Disposition: A | Discharge status: Home / Self Care | Payer: Federal, State, Local not specified - PPO | Attending: Obstetrics and Gynecology | Admitting: Obstetrics and Gynecology

## 2022-04-22 ENCOUNTER — Other Ambulatory Visit: Payer: Self-pay

## 2022-04-22 DIAGNOSIS — Z3201 Encounter for pregnancy test, result positive: Secondary | ICD-10-CM | POA: Diagnosis not present

## 2022-04-22 DIAGNOSIS — Z3A01 Less than 8 weeks gestation of pregnancy: Secondary | ICD-10-CM | POA: Insufficient documentation

## 2022-04-22 DIAGNOSIS — Z711 Person with feared health complaint in whom no diagnosis is made: Secondary | ICD-10-CM

## 2022-04-22 DIAGNOSIS — O26891 Other specified pregnancy related conditions, first trimester: Secondary | ICD-10-CM | POA: Diagnosis not present

## 2022-04-22 DIAGNOSIS — R1031 Right lower quadrant pain: Secondary | ICD-10-CM | POA: Insufficient documentation

## 2022-04-22 DIAGNOSIS — R1011 Right upper quadrant pain: Secondary | ICD-10-CM | POA: Diagnosis not present

## 2022-04-22 LAB — URINALYSIS, ROUTINE W REFLEX MICROSCOPIC
Bilirubin Urine: NEGATIVE
Glucose, UA: NEGATIVE mg/dL
Hgb urine dipstick: NEGATIVE
Ketones, ur: NEGATIVE mg/dL
Leukocytes,Ua: NEGATIVE
Nitrite: NEGATIVE
Protein, ur: NEGATIVE mg/dL
Specific Gravity, Urine: 1.013 (ref 1.005–1.030)
pH: 5 (ref 5.0–8.0)

## 2022-04-22 LAB — CBC
HCT: 33.9 % — ABNORMAL LOW (ref 36.0–46.0)
Hemoglobin: 11.1 g/dL — ABNORMAL LOW (ref 12.0–15.0)
MCH: 25.7 pg — ABNORMAL LOW (ref 26.0–34.0)
MCHC: 32.7 g/dL (ref 30.0–36.0)
MCV: 78.5 fL — ABNORMAL LOW (ref 80.0–100.0)
Platelets: 389 10*3/uL (ref 150–400)
RBC: 4.32 MIL/uL (ref 3.87–5.11)
RDW: 15.3 % (ref 11.5–15.5)
WBC: 6.9 10*3/uL (ref 4.0–10.5)
nRBC: 0 % (ref 0.0–0.2)

## 2022-04-22 LAB — ABO/RH: ABO/RH(D): B POS

## 2022-04-22 LAB — HCG, QUANTITATIVE, PREGNANCY: hCG, Beta Chain, Quant, S: 1193 m[IU]/mL — ABNORMAL HIGH (ref <5)

## 2022-04-22 LAB — POCT PREGNANCY, URINE: Preg Test, Ur: POSITIVE — AB

## 2022-04-22 NOTE — MAU Provider Note (Signed)
History     CSN: [id number]  Arrival date and time: 04/22/22 1524   Event Date/Time   First Provider Initiated Contact with Patient 04/22/22 1819      Chief Complaint  Patient presents with   Possible Pregnancy   Katrina Garcia , a  37 y.o. [phone] at Unknown presents to MAU with complaints of intermittent right lower quadrant pain. Patient states she recently started working out and wonders if its a muscle spasm. She states it feels like a "cramp" but states she has not tried to relieve symptoms at home. Currently rates pain a 1/10. She states she is recently late, and took a HPT that was positive. She states she really just wanted to make sure that everything is ok. She denies vaginal bleeding or abnormal vaginal discharge. She also denies urinary symptoms.          OB History     Gravida  2   Para  1   Term      Preterm  1   AB  1   Living  1      SAB      IAB      Ectopic      Multiple  0   Live Births  1           Past Medical History:  Diagnosis Date   Abnormal Pap smear of cervix    ascus hpv hr+   Fibroid    Fibroid uterus 07/29/2013   Left subserosal 3.5 x 3.1 x 2.5 Left Intramural 1.2 x 1.3 x 1.4     HPV (human papilloma virus) infection    MDD (major depressive disorder)    Migraines    Pregnancy induced hypertension    PTSD (post-traumatic stress disorder)     Past Surgical History:  Procedure Laterality Date   CESAREAN SECTION N/A 12/18/2013   Procedure: CESAREAN SECTION;  Surgeon: Levie Heritage, DO;  Location: [facility];  Service: Obstetrics;  Laterality: N/A;   WISDOM TOOTH EXTRACTION      Family History  Problem Relation Age of Onset   Hypertension Father    Cancer Paternal Grandmother        breast cancer   Hypertension Paternal Grandmother    Breast cancer Paternal Grandmother     Social History   Tobacco Use   Smoking status: Former    Packs/day: .5    Types: Cigarettes    Quit date: 01/29/2017    Years since  quitting: 5.2   Smokeless tobacco: Never  Vaping Use   Vaping Use: Never used  Substance Use Topics   Alcohol use: Not Currently   Drug use: Yes    Types: Marijuana    Comment: everyday    Allergies: No Known Allergies  Medications Prior to Admission  Medication Sig Dispense Refill Last Dose   amLODipine (NORVASC) 10 MG tablet Take 1 tablet (10 mg total) by mouth at bedtime. 90 tablet 1    etonogestrel-ethinyl estradiol (NUVARING) 0.12-0.015 MG/24HR vaginal ring Place 1 each vaginally every 28 (twenty-eight) days. Insert vaginally and leave in place for 3 consecutive weeks, then remove for 1 week. 3 each 4    FLUoxetine (PROZAC) 20 MG tablet Take 1 tablet (20 mg total) by mouth daily. 90 tablet 1    hydrochlorothiazide (HYDRODIURIL) 25 MG tablet Take 1 tablet (25 mg total) by mouth daily. No additional refills without an office visit 30 tablet 0    potassium chloride SA (KLOR-CON) 20  MEQ tablet Take 1 tablet (20 mEq total) by mouth daily. 90 tablet 1    Prenatal Vit-Fe Fumarate-FA (PRENATAL MULTIVITAMIN) TABS tablet Take 1 tablet by mouth daily at 12 noon.      UNABLE TO FIND Med Name: acne medication       Review of Systems  Constitutional:  Negative for chills, fatigue and fever.  Eyes:  Negative for pain and visual disturbance.  Respiratory:  Negative for apnea, shortness of breath and wheezing.   Cardiovascular:  Negative for chest pain and palpitations.  Gastrointestinal:  Positive for abdominal pain. Negative for constipation, diarrhea, nausea and vomiting.  Genitourinary:  Negative for difficulty urinating, dysuria, pelvic pain, vaginal bleeding, vaginal discharge and vaginal pain.  Musculoskeletal:  Negative for back pain.  Neurological:  Negative for seizures, weakness and headaches.  Psychiatric/Behavioral:  Negative for suicidal ideas.    Physical Exam   Blood pressure (!) 141/87, pulse 70, temperature 98.4 F (36.9 C), temperature source Oral, resp. rate 17, height  5' 1.5" (1.562 m), weight 82.2 kg, last menstrual period 03/20/2022, SpO2 100 %.  Physical Exam Vitals and nursing note reviewed.  Constitutional:      General: She is not in acute distress.    Appearance: Normal appearance.  HENT:     Head: Normocephalic.  Pulmonary:     Effort: Pulmonary effort is normal.  Abdominal:     Palpations: Abdomen is soft.  Musculoskeletal:     Cervical back: Normal range of motion.  Skin:    General: Skin is warm and dry.  Neurological:     Mental Status: She is alert and oriented to person, place, and time.  Psychiatric:        Mood and Affect: Mood normal.     MAU Course  Procedures Orders Placed This Encounter  Procedures   US OB LESS THAN 14 WEEKS WITH OB TRANSVAGINAL   CBC   hCG, quantitative, pregnancy   Urinalysis, Routine w reflex microscopic -Urine, Clean Catch   Diet NPO time specified   Pregnancy, urine POC   ABO/Rh   Discharge patient   Results for orders placed or performed during the hospital encounter of 04/22/22 (from the past 24 hour(s))  Pregnancy, urine POC     Status: Abnormal   Collection Time: 04/22/22  4:00 PM  Result Value Ref Range   Preg Test, Ur POSITIVE (A) NEGATIVE  Urinalysis, Routine w reflex microscopic -Urine, Clean Catch     Status: Abnormal   Collection Time: 04/22/22  4:17 PM  Result Value Ref Range   Color, Urine STRAW (A) YELLOW   APPearance CLEAR CLEAR   Specific Gravity, Urine 1.013 1.005 - 1.030   pH 5.0 5.0 - 8.0   Glucose, UA NEGATIVE NEGATIVE mg/dL   Hgb urine dipstick NEGATIVE NEGATIVE   Bilirubin Urine NEGATIVE NEGATIVE   Ketones, ur NEGATIVE NEGATIVE mg/dL   Protein, ur NEGATIVE NEGATIVE mg/dL   Nitrite NEGATIVE NEGATIVE   Leukocytes,Ua NEGATIVE NEGATIVE  ABO/Rh     Status: None   Collection Time: 04/22/22  4:31 PM  Result Value Ref Range   ABO/RH(D) B POS    No rh immune globuloin      NOT A RH IMMUNE GLOBULIN CANDIDATE, PT RH POSITIVE Performed at [facility], [address] [place], [place] [address]   CBC     Status: Abnormal   Collection Time: 04/22/22  4:32 PM  Result Value Ref Range   WBC 6.9 4.0 - 10.5  K/uL   RBC 4.32 3.87 - 5.11 MIL/uL   Hemoglobin 11.1 (L) 12.0 - 15.0 g/dL   HCT 16.1 (L) 09.6 - 04.5 %   MCV 78.5 (L) 80.0 - 100.0 fL   MCH 25.7 (L) 26.0 - 34.0 pg   MCHC 32.7 30.0 - 36.0 g/dL   RDW 40.9 81.1 - 91.4 %   Platelets 389 150 - 400 K/uL   nRBC 0.0 0.0 - 0.2 %   US OB LESS THAN 14 WEEKS WITH OB TRANSVAGINAL  Result Date: 04/22/2022 CLINICAL DATA:  RIGHT upper quadrant pain. Estimated gestational age by last menstrual period equals 4 weeks 5 days. EXAM: OBSTETRIC <14 WK [place] AND TRANSVAGINAL OB US TECHNIQUE: Both transabdominal and transvaginal ultrasound examinations were performed for complete evaluation of the gestation as well as the maternal uterus, adnexal regions, and pelvic cul-de-sac. Transvaginal technique was performed to assess early pregnancy. COMPARISON:  None Available. FINDINGS: Intrauterine gestational sac: Present Yolk sac:  Not identified Embryo:  Not identified MSD: 3 mm   5 w   0 d Subchorionic hemorrhage:  None visualized. Maternal uterus/adnexae: Normal ovaries. No free fluid. Several uterine fibroids measuring up to 5 cm. IMPRESSION: Probable early intrauterine gestational sac, but no yolk sac, fetal pole, or cardiac activity yet visualized. Recommend follow-up quantitative B-HCG levels and follow-up US in 14 days to assess viability. This recommendation follows SRU consensus guidelines: Diagnostic Criteria for Nonviable Pregnancy Early in the First Trimester. Malva Limes Med 2013; 782:9562-13. Electronically Signed   By: Genevive Bi M.D.   On: 04/22/2022 17:58    MDM - Lab called to say patient's wet prep timed out so they canceled the order.  - UA straw colored but otherwise normal, low suspicion for UTI.  - GC Pending upon discharge  - WBC count normal, low suspicion for infection.  - US revealed a gestational sac  measuring [redacted]w[redacted]d. IUP. Recommendation for follow up viability [place] in 7-14 days. -  Quant pending upon discharge.  - Plan for discharge.   Assessment and Plan   1. Less than [redacted] weeks gestation of pregnancy   2. [redacted] weeks gestation of pregnancy   3. Right lower quadrant abdominal pain   4. Physically well but worried    - Reviewed that [place] results revealed a single IUP measuring [redacted]w[redacted]d. Reviewed the recommendation for follow up US in 7-14 days to assess viability, Patient verbalized understanding.  - Reviewed that abdominal cramping can be a normal discomfort of pregnancy.  - Worsening signs and return precautions reviewed.  - Viability [place] scheduled for [facility]/05/22 at [facility] at 10am.  - Patient discharged home in stable condition and may return to MAU as needed.   Claudette Head, MSN CNM  04/22/2022, 6:19 PM

## 2022-04-22 NOTE — MAU Note (Signed)
Katrina Garcia is a 37 y.o. at Unknown here in MAU reporting: had a +HPT and wants pregnancy confirmation.  Denies VB, reports pain RLQ.  States pain is dull and crampy. LMP: 03/20/2022 Onset of complaint: weekend Pain score: 4 Vitals:   04/22/22 1553  BP: (!) 141/87  Pulse: 70  Resp: 17  Temp: 98.4 F (36.9 C)  SpO2: 100%     FHT:NA Lab orders placed from triage:   UPT

## 2022-04-23 LAB — GC/CHLAMYDIA PROBE AMP (~~LOC~~) NOT AT ARMC
Chlamydia: NEGATIVE
Comment: NEGATIVE
Comment: NORMAL
Neisseria Gonorrhea: NEGATIVE

## 2022-04-30 ENCOUNTER — Encounter: Payer: Self-pay | Admitting: Obstetrics & Gynecology

## 2022-04-30 ENCOUNTER — Ambulatory Visit (INDEPENDENT_AMBULATORY_CARE_PROVIDER_SITE_OTHER): Payer: Federal, State, Local not specified - PPO | Admitting: Obstetrics & Gynecology

## 2022-04-30 ENCOUNTER — Other Ambulatory Visit (HOSPITAL_COMMUNITY)
Admission: RE | Admit: 2022-04-30 | Discharge: 2022-04-30 | Disposition: A | Discharge status: Home / Self Care | Payer: Federal, State, Local not specified - PPO | Source: Ambulatory Visit | Attending: Obstetrics & Gynecology | Admitting: Obstetrics & Gynecology

## 2022-04-30 VITALS — BP 120/78 | HR 83 | Resp 16

## 2022-04-30 DIAGNOSIS — R87810 Cervical high risk human papillomavirus (HPV) DNA test positive: Secondary | ICD-10-CM | POA: Insufficient documentation

## 2022-04-30 DIAGNOSIS — N912 Amenorrhea, unspecified: Secondary | ICD-10-CM

## 2022-04-30 DIAGNOSIS — N870 Mild cervical dysplasia: Secondary | ICD-10-CM | POA: Insufficient documentation

## 2022-04-30 DIAGNOSIS — Z3491 Encounter for supervision of normal pregnancy, unspecified, first trimester: Secondary | ICD-10-CM

## 2022-04-30 DIAGNOSIS — Z3201 Encounter for pregnancy test, result positive: Secondary | ICD-10-CM

## 2022-04-30 LAB — PREGNANCY, URINE: Preg Test, Ur: POSITIVE — AB

## 2022-04-30 NOTE — Progress Notes (Signed)
Katrina Garcia December 10, 1985 [id number]        37 y.o.  G3P1A1 Single. 31 yo son. LMP 03/20/22.  EDD 12/25/22.  RP: Confirmation of pregnancy and counseling  HPI: LMP 03/20/22 at 5 6/[redacted] wks gestation. No vaginal bleeding.  No pelvic pain. Ob US showed an IU GS 5 wks on 04/22/22.  Repeat Ob US scheduled at 2 weeks.  cHTN on Norvasc 10 mg daily.  On PNVs. 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  HPV 18-45 Pos. Pap/HPV HR, HPV 16-18-45, Gono-Chlam today.   Ob US done 04/22/22: Intrauterine gestational sac: Present Yolk sac:  Not identified Embryo:  Not identified MSD: 3 mm   5 w   0 d Subchorionic hemorrhage:  None visualized. Maternal uterus/adnexae: Normal ovaries. No free fluid. Several uterine fibroids measuring up to 5 cm.   IMPRESSION: Probable early intrauterine gestational sac, but no yolk sac, fetal pole, or cardiac activity yet visualized. Recommend follow-up quantitative B-HCG levels and follow-up US in 14 days to assess viability.   OB History  Gravida Para Term Preterm AB Living  SAB IAB Ectopic Multiple Live Births        0 1    # Outcome Date GA Lbr Len/2nd Weight Sex Delivery Anes PTL Lv  3 Current           2 Preterm 12/18/13 [redacted]w[redacted]d  5 lb 12.8 oz (2.631 kg) M CS-LTranv EPI  LIV  1 AB             Past medical history,surgical history, problem list, medications, allergies, family history and social history were all reviewed and documented in the EPIC chart.   Directed ROS with pertinent positives and negatives documented in the history of present illness/assessment and plan.  Exam:  Vitals:   04/30/22 1341  BP: 120/78  Pulse: 83  Resp: 16  SpO2: 98%   General appearance:  Normal  Gynecologic exam: Vulva normal.  Speculum:  Cervix long/closed.  No blood, normal secretions.  Pap/HPV HR, 16-18-45, Gono-Chlam done.  Bimanual exam:  Uterus about 9 cm, nodular, mobile, NT.  No adnexal mass, NT.   Assessment/Plan:  37 y.o. [id number]   1.  Amenorrhea UPT Positive. - Pregnancy, urine  2. First trimester pregnancy LMP 03/20/22 at 5 6/[redacted] wks gestation. No vaginal bleeding.  No pelvic pain. Ob US showed an IU GS 5 wks on 04/22/22.  Repeat Ob US scheduled at 2 weeks.  cHTN on Norvasc 10 mg daily.  On PNVs. 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  HPV 18-45 Pos. Pap/HPV HR, HPV 16-18-45, Gono-Chlam today. 5 6/[redacted] wks gestation per LMP and corresponding to GS at 5 wks on 04/22/22 Ob US.  Ob US viability scheduled 2 wks later.  Counseling on pregnancy.  cHTN on Norvasc.  BP 120/78 today.  Will monitor BP at home, may have to stop Norvasc up to 20 weeks per BP readings.  Continue PNVs.  Establish Ob Care ASAP.  3. Mild cervical dysplasia, histologically confirmed Pap/HPV HR, 16-18-45, Gono-Chlam done. - Cytology - PAP( [place])  4. Pap smear of cervix shows high risk HPV present - Cytology - PAP( [facility])  Other orders - mometasone (NASONEX) 50 MCG/ACT nasal spray; Place 2 sprays into the nose daily. (Patient not taking: Reported on 04/30/2022) - levocetirizine (XYZAL) 5 MG tablet; Take 5 mg by mouth every evening. (Patient not taking: Reported on 04/30/2022)   Genia Del MD, 1:50 PM  04/30/2022     

## 2022-05-02 ENCOUNTER — Inpatient Hospital Stay (HOSPITAL_COMMUNITY)
Admission: AD | Admit: 2022-05-02 | Discharge: 2022-05-02 | Disposition: A | Discharge status: Home / Self Care | Payer: Federal, State, Local not specified - PPO | Attending: Obstetrics and Gynecology | Admitting: Obstetrics and Gynecology

## 2022-05-02 ENCOUNTER — Encounter (HOSPITAL_COMMUNITY): Payer: Self-pay

## 2022-05-02 DIAGNOSIS — Z79899 Other long term (current) drug therapy: Secondary | ICD-10-CM | POA: Insufficient documentation

## 2022-05-02 DIAGNOSIS — O99351 Diseases of the nervous system complicating pregnancy, first trimester: Secondary | ICD-10-CM | POA: Diagnosis not present

## 2022-05-02 DIAGNOSIS — O161 Unspecified maternal hypertension, first trimester: Secondary | ICD-10-CM | POA: Diagnosis not present

## 2022-05-02 DIAGNOSIS — R519 Headache, unspecified: Secondary | ICD-10-CM | POA: Diagnosis not present

## 2022-05-02 DIAGNOSIS — Z3A01 Less than 8 weeks gestation of pregnancy: Secondary | ICD-10-CM | POA: Diagnosis not present

## 2022-05-02 DIAGNOSIS — O26891 Other specified pregnancy related conditions, first trimester: Secondary | ICD-10-CM

## 2022-05-02 DIAGNOSIS — O10011 Pre-existing essential hypertension complicating pregnancy, first trimester: Secondary | ICD-10-CM | POA: Diagnosis not present

## 2022-05-02 LAB — URINALYSIS, ROUTINE W REFLEX MICROSCOPIC
Bilirubin Urine: NEGATIVE
Glucose, UA: NEGATIVE mg/dL
Hgb urine dipstick: NEGATIVE
Ketones, ur: NEGATIVE mg/dL
Leukocytes,Ua: NEGATIVE
Nitrite: NEGATIVE
Protein, ur: NEGATIVE mg/dL
Specific Gravity, Urine: 1.02 (ref 1.005–1.030)
pH: 6 (ref 5.0–8.0)

## 2022-05-02 MED ORDER — ACETAMINOPHEN-CAFFEINE 500-65 MG PO TABS
2.0000 | ORAL_TABLET | Freq: Once | ORAL | Status: AC
  Administered 2022-05-02: 2 via ORAL
  Filled 2022-05-02: qty 2

## 2022-05-02 MED ORDER — METOCLOPRAMIDE HCL 10 MG PO TABS: 10.0000 mg | ORAL_TABLET | Freq: Three times a day (TID) | ORAL | 0 refills | Status: DC | PRN

## 2022-05-02 MED ORDER — METOCLOPRAMIDE HCL 10 MG PO TABS
10.0000 mg | ORAL_TABLET | Freq: Once | ORAL | Status: AC
  Administered 2022-05-02: 10 mg via ORAL
  Filled 2022-05-02: qty 1

## 2022-05-02 NOTE — Discharge Instructions (Signed)
For prevention of migraines in pregnancy: -Magnesium, 400mg by mouth, once daily -Vitamin B2, 400mg by mouth, once daily  For treatment of migraines in pregnancy: -take medication at the first sign of the pain of a headache, or the first sign of your aura -start with 1000mg Tylenol (or excedrin tension headache with acetaminophen & caffeine only, NOT aspirin) with or without Reglan 10mg -if no relief after 1-2hours, can take Flexeril 10mg -Do not take more than 4000 mg of tylenol (acetaminophen) per day  

## 2022-05-02 NOTE — MAU Note (Signed)
.  Katrina Garcia is a 37 y.o. at [redacted]w[redacted]d here in MAU reporting: Confirmed pregnancy at gyn office this week. Was told to "cut back" on her b/p meds and  monitor her b/p. Did not take her meds  yesterday. Started having a headache today  and b/p was up 154/86. Took her b/p meds and then a nap . Still has HA. Reports some back paina nd some cramping LMP:  Onset of complaint: today Pain score: 7 There were no vitals filed for this visit.   FHT:n/a Lab orders placed from triage:  u/a

## 2022-05-02 NOTE — MAU Provider Note (Signed)
History     [id number]  Arrival date and time: 05/02/22 1817    Chief Complaint  Patient presents with   Headache     HPI Rochella Benner is a 37 y.o. [id number] at [redacted]w[redacted]d by LMP who presents for headache & hypertension. Takes norvasc for chronic hypertension. Was told last week to cut back on her antihypertensive due to pregnancy so she didn't take her norvasc for 2 days. Headache started today so she checked her BP & it was elevated. Took her norvasc at 4 pm. Hasn't treated headache. Currently rates 4/10. No associated or aggravating factors.  Reports history of migraines. Has had some low back pain as well.  Denies vaginal bleeding, fever, n/v/d.  Has ultrasound appointment on Tuesday & OB appointment the following week.   --/--/B POS (04/17 1631)  OB History     Gravida  3   Para  1   Term      Preterm  1   AB  1   Living  1      SAB      IAB      Ectopic      Multiple  0   Live Births  1           Past Medical History:  Diagnosis Date   Abnormal Pap smear of cervix    ascus hpv hr+   Fibroid    Fibroid uterus 07/29/2013   Left subserosal 3.5 x 3.1 x 2.5 Left Intramural 1.2 x 1.3 x 1.4     HPV (human papilloma virus) infection    MDD (major depressive disorder)    Migraines    Pregnancy induced hypertension    PTSD (post-traumatic stress disorder)     Past Surgical History:  Procedure Laterality Date   CESAREAN SECTION N/A 12/18/2013   Procedure: CESAREAN SECTION;  Surgeon: Levie Heritage, DO;  Location: [facility];  Service: Obstetrics;  Laterality: N/A;   WISDOM TOOTH EXTRACTION      Family History  Problem Relation Age of Onset   Hypertension Father    Cancer Paternal Grandmother        breast cancer   Hypertension Paternal Grandmother    Breast cancer Paternal Grandmother     Social History   Socioeconomic History   Marital status: Single    Spouse name: Not on file   Number of children: Not on file   Years of education: Not on file    Highest education level: Not on file  Occupational History   Not on file  Tobacco Use   Smoking status: Former    Packs/day: .5    Types: Cigarettes    Quit date: 01/29/2017    Years since quitting: 5.2   Smokeless tobacco: Never  Vaping Use   Vaping Use: Never used  Substance and Sexual Activity   Alcohol use: Not Currently   Drug use: Not Currently   Sexual activity: Yes    Partners: Male  Other Topics Concern   Not on file  Social History Narrative   Not on file   Social Determinants of Health   Financial Resource Strain: Not on file  Food Insecurity: Not on file  Transportation Needs: Not on file  Physical Activity: Not on file  Stress: Not on file  Social Connections: Not on file  Intimate Partner Violence: Not on file    No Known Allergies  No current facility-administered medications on file prior to encounter.   Current Outpatient Medications on  File Prior to Encounter  Medication Sig Dispense Refill   amLODipine (NORVASC) 10 MG tablet Take 1 tablet (10 mg total) by mouth at bedtime. 90 tablet 1   Prenatal Vit-Fe Fumarate-FA (PRENATAL MULTIVITAMIN) TABS tablet Take 1 tablet by mouth daily at 12 noon.       ROS Pertinent positives and negative per HPI, all others reviewed and negative  Physical Exam   BP 118/70 (BP Location: Right Arm)   Pulse 74   Temp 98.6 F (37 C) (Oral)   Resp 17   Ht 5' 1.5" (1.562 m)   Wt 82.6 kg   LMP 03/20/2022   SpO2 100%   BMI 33.83 kg/m   Patient Vitals for the past 24 hrs:  BP Temp Temp src Pulse Resp SpO2 Height Weight  05/02/22 2007 118/70 98.6 F (37 C) Oral 74 17 100 % -- --  05/02/22 1940 121/71 -- -- 64 17 -- -- --  05/02/22 1921 -- -- -- -- -- 99 % -- --  05/02/22 1916 -- -- -- -- -- 100 % -- --  05/02/22 1911 -- -- -- -- -- 100 % -- --  05/02/22 1907 125/73 -- -- 67 -- -- -- --  05/02/22 1847 135/86 98.2 F (36.8 C) -- 73 18 -- 5' 1.5" (1.562 m) 82.6 kg    Physical Exam Vitals and nursing note  reviewed.  Constitutional:      General: She is not in acute distress.    Appearance: She is well-developed. She is not ill-appearing.  HENT:     Head: Normocephalic and atraumatic.  Eyes:     General: No scleral icterus.       Right eye: No discharge.        Left eye: No discharge.     Conjunctiva/sclera: Conjunctivae normal.  Pulmonary:     Effort: Pulmonary effort is normal. No respiratory distress.  Neurological:     General: No focal deficit present.     Mental Status: She is alert.  Psychiatric:        Mood and Affect: Mood normal.        Behavior: Behavior normal.       Labs Results for orders placed or performed during the hospital encounter of 05/02/22 (from the past 24 hour(s))  Urinalysis, Routine w reflex microscopic -Urine, Clean Catch     Status: None   Collection Time: 05/02/22  6:53 PM  Result Value Ref Range   Color, Urine YELLOW YELLOW   APPearance CLEAR CLEAR   Specific Gravity, Urine 1.020 1.005 - 1.030   pH 6.0 5.0 - 8.0   Glucose, UA NEGATIVE NEGATIVE mg/dL   Hgb urine dipstick NEGATIVE NEGATIVE   Bilirubin Urine NEGATIVE NEGATIVE   Ketones, ur NEGATIVE NEGATIVE mg/dL   Protein, ur NEGATIVE NEGATIVE mg/dL   Nitrite NEGATIVE NEGATIVE   Leukocytes,Ua NEGATIVE NEGATIVE    Imaging No results found.  MAU Course  Procedures Lab Orders         Urinalysis, Routine w reflex microscopic -Urine, Clean Catch    Meds ordered this encounter  Medications   acetaminophen-caffeine (EXCEDRIN TENSION HEADACHE) 500-65 MG per tablet 2 tablet   metoCLOPramide (REGLAN) tablet 10 mg   metoCLOPramide (REGLAN) 10 MG tablet    Sig: Take 1 tablet (10 mg total) by mouth every 8 (eight) hours as needed for nausea (or headache).    Dispense:  30 tablet    Refill:  0    Order Specific Question:  Supervising Provider    Answer:   Lazaro Arms [[id number]]   Imaging Orders  No imaging studies ordered today    MDM moderate  Assessment and Plan   1. Pregnancy  headache in first trimester   2. Hypertension affecting pregnancy in first trimester   3. [redacted] weeks gestation of pregnancy    -Patient normotensive in MAU, likely due to taking her antihypertensive prior to arrival. Encouraged patient to continue with her meds until she follows up with her OB.  -Given excedrin tension & reglan for headache. After administration, patient requested to be discharged home. Prescribed reglan for nausea & future headaches. Given info on migraine management at home.  -Had IUGS & YS on previous imaging. No abdominal pain or vaginal bleeding today. Scheduled for viability scan on Tuesday.   Dispo: discharged to home in stable condition.   Discharge Instructions     Discharge patient   Complete by: As directed    Discharge disposition: 01-Home or Self Care   Discharge patient date: 05/02/2022       Judeth Horn, NP 05/03/22 10:30 AM  Allergies as of 05/02/2022   No Known Allergies      Medication List     STOP taking these medications    FLUoxetine 20 MG tablet Commonly known as: PROZAC   levocetirizine 5 MG tablet Commonly known as: XYZAL   mometasone 50 MCG/ACT nasal spray Commonly known as: NASONEX   potassium chloride SA 20 MEQ tablet Commonly known as: KLOR-CON M       TAKE these medications    amLODipine 10 MG tablet Commonly known as: NORVASC Take 1 tablet (10 mg total) by mouth at bedtime.   metoCLOPramide 10 MG tablet Commonly known as: Reglan Take 1 tablet (10 mg total) by mouth every 8 (eight) hours as needed for nausea (or headache).   prenatal multivitamin Tabs tablet Take 1 tablet by mouth daily at 12 noon.

## 2022-05-03 ENCOUNTER — Encounter (HOSPITAL_COMMUNITY): Payer: Self-pay | Admitting: Obstetrics and Gynecology

## 2022-05-05 ENCOUNTER — Other Ambulatory Visit: Payer: Self-pay | Admitting: Certified Nurse Midwife

## 2022-05-05 ENCOUNTER — Ambulatory Visit: Payer: Federal, State, Local not specified - PPO

## 2022-05-05 DIAGNOSIS — O3680X0 Pregnancy with inconclusive fetal viability, not applicable or unspecified: Secondary | ICD-10-CM

## 2022-05-05 LAB — CYTOLOGY - PAP
Chlamydia: NEGATIVE
Comment: NEGATIVE
Comment: NEGATIVE
Comment: NEGATIVE
Comment: NEGATIVE
Comment: NORMAL
Diagnosis: UNDETERMINED — AB
HPV 16: NEGATIVE
HPV 18 / 45: NEGATIVE
High risk HPV: POSITIVE — AB
Neisseria Gonorrhea: NEGATIVE

## 2022-05-13 ENCOUNTER — Ambulatory Visit (HOSPITAL_COMMUNITY): Payer: Self-pay

## 2022-05-14 ENCOUNTER — Ambulatory Visit (HOSPITAL_COMMUNITY): Payer: Federal, State, Local not specified - PPO

## 2022-05-14 DIAGNOSIS — I10 Essential (primary) hypertension: Secondary | ICD-10-CM | POA: Diagnosis not present

## 2022-05-14 DIAGNOSIS — Z3480 Encounter for supervision of other normal pregnancy, unspecified trimester: Secondary | ICD-10-CM | POA: Diagnosis not present

## 2022-05-14 DIAGNOSIS — Z3201 Encounter for pregnancy test, result positive: Secondary | ICD-10-CM | POA: Diagnosis not present

## 2022-05-14 DIAGNOSIS — N925 Other specified irregular menstruation: Secondary | ICD-10-CM | POA: Diagnosis not present

## 2022-05-14 LAB — OB RESULTS CONSOLE HIV ANTIBODY (ROUTINE TESTING): HIV: NONREACTIVE

## 2022-05-14 LAB — OB RESULTS CONSOLE RUBELLA ANTIBODY, IGM: Rubella: IMMUNE

## 2022-05-14 LAB — HEPATITIS C ANTIBODY: HCV Ab: NEGATIVE

## 2022-05-14 LAB — OB RESULTS CONSOLE ANTIBODY SCREEN: Antibody Screen: NEGATIVE

## 2022-05-14 LAB — OB RESULTS CONSOLE RPR: RPR: NONREACTIVE

## 2022-05-14 LAB — OB RESULTS CONSOLE HEPATITIS B SURFACE ANTIGEN: Hepatitis B Surface Ag: NEGATIVE

## 2022-05-15 DIAGNOSIS — M7989 Other specified soft tissue disorders: Secondary | ICD-10-CM | POA: Diagnosis not present

## 2022-05-15 DIAGNOSIS — O99891 Other specified diseases and conditions complicating pregnancy: Secondary | ICD-10-CM | POA: Diagnosis not present

## 2022-05-15 DIAGNOSIS — R2242 Localized swelling, mass and lump, left lower limb: Secondary | ICD-10-CM | POA: Diagnosis not present

## 2022-05-15 DIAGNOSIS — Z3A08 8 weeks gestation of pregnancy: Secondary | ICD-10-CM | POA: Diagnosis not present

## 2022-05-15 DIAGNOSIS — O99711 Diseases of the skin and subcutaneous tissue complicating pregnancy, first trimester: Secondary | ICD-10-CM | POA: Diagnosis not present

## 2022-05-15 DIAGNOSIS — M722 Plantar fascial fibromatosis: Secondary | ICD-10-CM | POA: Diagnosis not present

## 2022-05-22 DIAGNOSIS — R87610 Atypical squamous cells of undetermined significance on cytologic smear of cervix (ASC-US): Secondary | ICD-10-CM | POA: Diagnosis not present

## 2022-05-27 ENCOUNTER — Other Ambulatory Visit: Payer: Self-pay

## 2022-05-27 DIAGNOSIS — B9689 Other specified bacterial agents as the cause of diseases classified elsewhere: Secondary | ICD-10-CM

## 2022-05-27 MED ORDER — METRONIDAZOLE 0.75 % VA GEL
1.0000 | Freq: Every day | VAGINAL | 0 refills | Status: AC
Start: 2022-05-27 — End: 2022-06-01

## 2022-06-02 ENCOUNTER — Encounter (HOSPITAL_COMMUNITY): Payer: Self-pay | Admitting: Obstetrics and Gynecology

## 2022-06-02 ENCOUNTER — Inpatient Hospital Stay (HOSPITAL_COMMUNITY)
Admission: AD | Admit: 2022-06-02 | Discharge: 2022-06-02 | Disposition: A | Discharge status: Home / Self Care | Payer: Federal, State, Local not specified - PPO | Attending: Obstetrics and Gynecology | Admitting: Obstetrics and Gynecology

## 2022-06-02 DIAGNOSIS — Z3A10 10 weeks gestation of pregnancy: Secondary | ICD-10-CM

## 2022-06-02 DIAGNOSIS — Z711 Person with feared health complaint in whom no diagnosis is made: Secondary | ICD-10-CM

## 2022-06-02 DIAGNOSIS — G8929 Other chronic pain: Secondary | ICD-10-CM

## 2022-06-02 DIAGNOSIS — R1030 Lower abdominal pain, unspecified: Secondary | ICD-10-CM | POA: Insufficient documentation

## 2022-06-02 DIAGNOSIS — O26891 Other specified pregnancy related conditions, first trimester: Secondary | ICD-10-CM | POA: Insufficient documentation

## 2022-06-02 DIAGNOSIS — Z87891 Personal history of nicotine dependence: Secondary | ICD-10-CM | POA: Diagnosis not present

## 2022-06-02 DIAGNOSIS — M545 Low back pain, unspecified: Secondary | ICD-10-CM | POA: Diagnosis not present

## 2022-06-02 MED ORDER — ACETAMINOPHEN 500 MG PO TABS
1000.0000 mg | ORAL_TABLET | Freq: Once | ORAL | Status: AC
  Administered 2022-06-02: 1000 mg via ORAL
  Filled 2022-06-02: qty 2

## 2022-06-02 NOTE — MAU Note (Signed)
..  Katrina Garcia is a 37 y.o. at [redacted]w[redacted]d here in MAU reporting: lower back pain that began last week and got worse today, reports that it is worse when she sits down.  Cramping that comes and goes and has improved after she drank water.  Denies vaginal bleeding Has not taken any medication for the pain.   Pain score: 8/10 back pain; 3/10 abdominal cramping Vitals:   06/02/22 0043  BP: 136/89  Pulse: 74  Resp: 15  Temp: 97.9 F (36.6 C)  SpO2: 100%   Lab orders placed from triage:  UA

## 2022-06-02 NOTE — MAU Provider Note (Signed)
History     CSN: [id number]  Arrival date and time: 06/02/22 0018   Event Date/Time   First Provider Initiated Contact with Patient 06/02/22 0102      Chief Complaint  Patient presents with   Back Pain   Katrina Garcia , a  37 y.o. [phone] at [redacted]w[redacted]d presents to MAU with complaints of new onset tailbone pain and on-going intermittent lower abdominal cramping. Patient states that the tailbone pain work her up out of her sleep this evening. She states tht the pain is worsened by sitting on it. She currently rates pain a 8/10, but denies attempting to relieve symptoms. She states that she tries to avoid taking something. She states that she feels like she needs to "pop" her back. She denies painful or difficulty urinating. She also reports on-going abdominal cramping that is come and go. She states that the pain is a little more severe tonight because of the back pain but states that its like a 2/10. She denies abnormal vaginal discharge and vaginal bleeding. She states that she is currently in the middle of treatment for BV. She has no other complaints.        Back Pain Associated symptoms include abdominal pain. Pertinent negatives include no chest pain, dysuria, fever, headaches, pelvic pain or weakness.    OB History     Gravida  3   Para  1   Term      Preterm  1   AB  1   Living  1      SAB      IAB      Ectopic      Multiple  0   Live Births  1           Past Medical History:  Diagnosis Date   Abnormal Pap smear of cervix    ascus hpv hr+   Fibroid uterus 07/29/2013   Left subserosal 3.5 x 3.1 x 2.5 Left Intramural 1.2 x 1.3 x 1.4     HPV (human papilloma virus) infection    MDD (major depressive disorder)    Migraines    PTSD (post-traumatic stress disorder)     Past Surgical History:  Procedure Laterality Date   CESAREAN SECTION N/A 12/18/2013   Procedure: CESAREAN SECTION;  Surgeon: Levie Heritage, DO;  Location: [facility];  Service: Obstetrics;   Laterality: N/A;   WISDOM TOOTH EXTRACTION      Family History  Problem Relation Age of Onset   Hypertension Father    Cancer Paternal Grandmother        breast cancer   Hypertension Paternal Grandmother    Breast cancer Paternal Grandmother     Social History   Tobacco Use   Smoking status: Former    Packs/day: .5    Types: Cigarettes    Quit date: 01/29/2017    Years since quitting: 5.3   Smokeless tobacco: Never  Vaping Use   Vaping Use: Never used  Substance Use Topics   Alcohol use: Not Currently   Drug use: Not Currently    Allergies: No Known Allergies  Medications Prior to Admission  Medication Sig Dispense Refill Last Dose   Prenatal Vit-Fe Fumarate-FA (PRENATAL MULTIVITAMIN) TABS tablet Take 1 tablet by mouth daily at 12 noon.   06/02/2022   amLODipine (NORVASC) 10 MG tablet Take 1 tablet (10 mg total) by mouth at bedtime. 90 tablet 1    metoCLOPramide (REGLAN) 10 MG tablet Take 1 tablet (10 mg total)  by mouth every 8 (eight) hours as needed for nausea (or headache). 30 tablet 0     Review of Systems  Constitutional:  Negative for chills, fatigue and fever.  Eyes:  Negative for pain and visual disturbance.  Respiratory:  Negative for apnea, shortness of breath and wheezing.   Cardiovascular:  Negative for chest pain and palpitations.  Gastrointestinal:  Positive for abdominal pain. Negative for constipation, diarrhea, nausea and vomiting.  Genitourinary:  Negative for difficulty urinating, dysuria, pelvic pain, vaginal bleeding, vaginal discharge and vaginal pain.  Musculoskeletal:  Positive for back pain.  Neurological:  Negative for seizures, weakness and headaches.  Psychiatric/Behavioral:  Negative for suicidal ideas.    Physical Exam   Blood pressure 136/89, pulse 74, temperature 97.9 F (36.6 C), temperature source Oral, resp. rate 15, height 5' 1.5" (1.562 m), weight 84.5 kg, last menstrual period 03/20/2022, SpO2 100 %.  Physical Exam Vitals and  nursing note reviewed.  Constitutional:      General: She is not in acute distress.    Appearance: Normal appearance.  HENT:     Head: Normocephalic.  Cardiovascular:     Rate and Rhythm: Normal rate and regular rhythm.  Pulmonary:     Effort: Pulmonary effort is normal.  Abdominal:     Palpations: Abdomen is soft.     Tenderness: There is no abdominal tenderness. There is no right CVA tenderness, left CVA tenderness, guarding or rebound.  Musculoskeletal:     Cervical back: Normal range of motion.  Skin:    General: Skin is warm and dry.     Capillary Refill: Capillary refill takes 2 to 3 seconds.  Neurological:     Mental Status: She is alert and oriented to person, place, and time.  Psychiatric:        Mood and Affect: Mood normal.    Patient informed that the ultrasound is considered a limited OB ultrasound and is not intended to be a complete ultrasound exam.  Patient also informed that the ultrasound is not being completed with the intent of assessing for fetal or placental anomalies or any pelvic abnormalities.  Explained that the purpose of today's ultrasound is to assess for viability.  Patient acknowledges the purpose of the exam and the limitations of the study.  FHT: 180 on doppler via [place].   Claudette Head, CNM  06/02/2022 1:29 AM    MAU Course  Procedures Orders Placed This Encounter  Procedures   Urinalysis, Routine w reflex microscopic -Urine, Clean Catch    MDM - FHT present.  - PO Tylenol ordered and Hot packs given  - Offered PO Flexeril but patient declines.  - Plan to reassess pain  - 2:57 AM- Reassessment pain 0/10 following Tylenol and Heat packs.  - plan for discharge   Assessment and Plan   1. Chronic low back pain, unspecified back pain laterality, unspecified whether sciatica present   2. [redacted] weeks gestation of pregnancy   3. Physically well but worried    - Reviewed comfort measures that can help relieve back pain in pregnancy.  - Worsening  signs and return precautions reviewed. - Recommended to continue treatment for BV as prescribed.  - Patient discharged home in stable condition and amy return to MAU as needed.   Claudette Head, MSN CNM  06/02/2022, 1:02 AM

## 2022-06-09 ENCOUNTER — Inpatient Hospital Stay (HOSPITAL_COMMUNITY)
Admission: AD | Admit: 2022-06-09 | Discharge: 2022-06-10 | Disposition: A | Discharge status: Home / Self Care | Payer: Federal, State, Local not specified - PPO | Attending: Obstetrics and Gynecology | Admitting: Obstetrics and Gynecology

## 2022-06-09 DIAGNOSIS — O26891 Other specified pregnancy related conditions, first trimester: Secondary | ICD-10-CM | POA: Insufficient documentation

## 2022-06-09 DIAGNOSIS — M545 Low back pain, unspecified: Secondary | ICD-10-CM | POA: Insufficient documentation

## 2022-06-09 DIAGNOSIS — Z3A11 11 weeks gestation of pregnancy: Secondary | ICD-10-CM | POA: Insufficient documentation

## 2022-06-09 DIAGNOSIS — M549 Dorsalgia, unspecified: Secondary | ICD-10-CM

## 2022-06-09 DIAGNOSIS — O26899 Other specified pregnancy related conditions, unspecified trimester: Secondary | ICD-10-CM

## 2022-06-09 DIAGNOSIS — O219 Vomiting of pregnancy, unspecified: Secondary | ICD-10-CM

## 2022-06-09 DIAGNOSIS — R1031 Right lower quadrant pain: Secondary | ICD-10-CM | POA: Insufficient documentation

## 2022-06-10 ENCOUNTER — Encounter (HOSPITAL_COMMUNITY): Payer: Self-pay | Admitting: Obstetrics and Gynecology

## 2022-06-10 DIAGNOSIS — O99891 Other specified diseases and conditions complicating pregnancy: Secondary | ICD-10-CM | POA: Diagnosis not present

## 2022-06-10 DIAGNOSIS — Z3A11 11 weeks gestation of pregnancy: Secondary | ICD-10-CM

## 2022-06-10 DIAGNOSIS — O26891 Other specified pregnancy related conditions, first trimester: Secondary | ICD-10-CM

## 2022-06-10 DIAGNOSIS — M549 Dorsalgia, unspecified: Secondary | ICD-10-CM

## 2022-06-10 DIAGNOSIS — R109 Unspecified abdominal pain: Secondary | ICD-10-CM | POA: Diagnosis not present

## 2022-06-10 DIAGNOSIS — O219 Vomiting of pregnancy, unspecified: Secondary | ICD-10-CM

## 2022-06-10 DIAGNOSIS — M545 Low back pain, unspecified: Secondary | ICD-10-CM | POA: Diagnosis not present

## 2022-06-10 DIAGNOSIS — R1031 Right lower quadrant pain: Secondary | ICD-10-CM | POA: Diagnosis not present

## 2022-06-10 LAB — BASIC METABOLIC PANEL
Anion gap: 10 (ref 5–15)
BUN: 5 mg/dL — ABNORMAL LOW (ref 6–20)
CO2: 22 mmol/L (ref 22–32)
Calcium: 8.8 mg/dL — ABNORMAL LOW (ref 8.9–10.3)
Chloride: 104 mmol/L (ref 98–111)
Creatinine, Ser: 0.65 mg/dL (ref 0.44–1.00)
GFR, Estimated: >60 mL/min (ref >60)
Glucose, Bld: 116 mg/dL — ABNORMAL HIGH (ref 70–99)
Potassium: 3.6 mmol/L (ref 3.5–5.1)
Sodium: 136 mmol/L (ref 135–145)

## 2022-06-10 LAB — URINALYSIS, ROUTINE W REFLEX MICROSCOPIC
Bacteria, UA: NONE SEEN
Bilirubin Urine: NEGATIVE
Glucose, UA: NEGATIVE mg/dL
Hgb urine dipstick: NEGATIVE
Ketones, ur: NEGATIVE mg/dL
Leukocytes,Ua: NEGATIVE
Nitrite: NEGATIVE
Protein, ur: NEGATIVE mg/dL
Specific Gravity, Urine: 1.023 (ref 1.005–1.030)
pH: 5 (ref 5.0–8.0)

## 2022-06-10 LAB — CBC
HCT: 31.8 % — ABNORMAL LOW (ref 36.0–46.0)
Hemoglobin: 10.6 g/dL — ABNORMAL LOW (ref 12.0–15.0)
MCH: 26.7 pg (ref 26.0–34.0)
MCHC: 33.3 g/dL (ref 30.0–36.0)
MCV: 80.1 fL (ref 80.0–100.0)
Platelets: 285 10*3/uL (ref 150–400)
RBC: 3.97 MIL/uL (ref 3.87–5.11)
RDW: 14.7 % (ref 11.5–15.5)
WBC: 8.7 10*3/uL (ref 4.0–10.5)
nRBC: 0 % (ref 0.0–0.2)

## 2022-06-10 MED ORDER — ONDANSETRON 4 MG PO TBDP
4.0000 mg | ORAL_TABLET | Freq: Once | ORAL | Status: AC
  Administered 2022-06-10: 4 mg via ORAL
  Filled 2022-06-10: qty 1

## 2022-06-10 MED ORDER — CYCLOBENZAPRINE HCL 5 MG PO TABS: 5.0000 mg | ORAL_TABLET | Freq: Three times a day (TID) | ORAL | 0 refills | Status: DC | PRN

## 2022-06-10 MED ORDER — ONDANSETRON 4 MG PO TBDP: 4.0000 mg | ORAL_TABLET | Freq: Four times a day (QID) | ORAL | 0 refills | Status: DC | PRN

## 2022-06-10 NOTE — MAU Note (Signed)
Lower back pain and abdominal pain that began after she picked up a hamper. Reports nausea and the urge to vomit but has not vomited.  Denies vaginal bleeding.

## 2022-06-10 NOTE — MAU Provider Note (Signed)
Chief Complaint: Back Pain, Abdominal Pain, and Nausea   Event Date/Time   First Provider Initiated Contact with Patient 06/10/22 0054        SUBJECTIVE HPI: Katrina Garcia is a 37 y.o. [id number] at [redacted]w[redacted]d by LMP who presents to maternity admissions reporting pain in lower back and right lower quadrant.  These pains have been present prior to today.  Also c/o nausea. . She denies vaginal bleeding, vaginal itching/burning, urinary symptoms, h/a, dizziness, n/v, or fever/chills.    Back Pain This is a recurrent problem. The problem is unchanged. The pain is present in the lumbar spine. The quality of the pain is described as aching. The symptoms are aggravated by bending and position. Associated symptoms include abdominal pain. Pertinent negatives include no dysuria, fever, headaches, numbness or pelvic pain.  Abdominal Pain This is a recurrent problem. The problem has been unchanged. The pain is located in the RLQ. The abdominal pain does not radiate. Pertinent negatives include no dysuria, fever or headaches. She has tried nothing for the symptoms.   RN note: Lower back pain and abdominal pain that began after she picked up a hamper. Reports nausea and the urge to vomit but has not vomited.  Denies vaginal bleeding.   Past Medical History:  Diagnosis Date   Abnormal Pap smear of cervix    ascus hpv hr+   Fibroid uterus 07/29/2013   Left subserosal 3.5 x 3.1 x 2.5 Left Intramural 1.2 x 1.3 x 1.4     HPV (human papilloma virus) infection    MDD (major depressive disorder)    Migraines    PTSD (post-traumatic stress disorder)    Past Surgical History:  Procedure Laterality Date   CESAREAN SECTION N/A 12/18/2013   Procedure: CESAREAN SECTION;  Surgeon: Levie Heritage, DO;  Location: [facility];  Service: Obstetrics;  Laterality: N/A;   WISDOM TOOTH EXTRACTION     Social History   Socioeconomic History   Marital status: Single    Spouse name: Not on file   Number of children: Not on file    Years of education: Not on file   Highest education level: Not on file  Occupational History   Not on file  Tobacco Use   Smoking status: Former    Packs/day: .5    Types: Cigarettes    Quit date: 01/29/2017    Years since quitting: 5.3   Smokeless tobacco: Never  Vaping Use   Vaping Use: Never used  Substance and Sexual Activity   Alcohol use: Not Currently   Drug use: Not Currently   Sexual activity: Yes    Partners: Male  Other Topics Concern   Not on file  Social History Narrative   Not on file   Social Determinants of Health   Financial Resource Strain: Not on file  Food Insecurity: Not on file  Transportation Needs: Not on file  Physical Activity: Not on file  Stress: Not on file  Social Connections: Not on file  Intimate Partner Violence: Not on file   No current facility-administered medications on file prior to encounter.   Current Outpatient Medications on File Prior to Encounter  Medication Sig Dispense Refill   Prenatal Vit-Fe Fumarate-FA (PRENATAL MULTIVITAMIN) TABS tablet Take 1 tablet by mouth daily at 12 noon.     amLODipine (NORVASC) 10 MG tablet Take 1 tablet (10 mg total) by mouth at bedtime. 90 tablet 1   metoCLOPramide (REGLAN) 10 MG tablet Take 1 tablet (10 mg total) by mouth  every 8 (eight) hours as needed for nausea (or headache). 30 tablet 0   No Known Allergies  I have reviewed patient's Past Medical Hx, Surgical Hx, Family Hx, Social Hx, medications and allergies.   ROS:  Review of Systems  Constitutional:  Negative for fever.  Gastrointestinal:  Positive for abdominal pain.  Genitourinary:  Negative for dysuria and pelvic pain.  Musculoskeletal:  Positive for back pain.  Neurological:  Negative for numbness and headaches.   Review of Systems  Other systems negative   Physical Exam  Physical Exam Patient Vitals for the past 24 hrs:  BP Temp Temp src Pulse Resp SpO2 Height Weight  06/10/22 0047 (!) 144/80 98.5 F (36.9 C) Oral 77  15 -- -- --  06/10/22 0046 -- -- -- -- -- 100 % -- --  06/10/22 0024 -- -- -- -- -- -- 5' 1.5" (1.562 m) 85.4 kg   Vitals:   06/10/22 0024 06/10/22 0046 06/10/22 0047  BP:   (!) 144/80  Pulse:   77  Resp:   15  Temp:   98.5 F (36.9 C)  TempSrc:   Oral  SpO2:  100%   Weight: 85.4 kg    Height: 5' 1.5" (1.562 m)     Constitutional: Well-developed, well-nourished female in no acute distress.  Cardiovascular: normal rate Respiratory: normal effort GI: Abd soft, non-tender. No guarding or rebound tenderness. MS: Extremities nontender, no edema, normal ROM Neurologic: Alert and oriented x 4.  GU: Neg CVAT.  LAB RESULTS Results for orders placed or performed during the hospital encounter of 06/09/22 (from the past 24 hour(s))  Urinalysis, Routine w reflex microscopic -Urine, Clean Catch     Status: Abnormal   Collection Time: 06/10/22 12:50 AM  Result Value Ref Range   Color, Urine YELLOW YELLOW   APPearance HAZY (A) CLEAR   Specific Gravity, Urine 1.023 1.005 - 1.030   pH 5.0 5.0 - 8.0   Glucose, UA NEGATIVE NEGATIVE mg/dL   Hgb urine dipstick NEGATIVE NEGATIVE   Bilirubin Urine NEGATIVE NEGATIVE   Ketones, ur NEGATIVE NEGATIVE mg/dL   Protein, ur NEGATIVE NEGATIVE mg/dL   Nitrite NEGATIVE NEGATIVE   Leukocytes,Ua NEGATIVE NEGATIVE   RBC / HPF 0-5 0 - 5 RBC/hpf   WBC, UA 0-5 0 - 5 WBC/hpf   Bacteria, UA NONE SEEN NONE SEEN   Squamous Epithelial / HPF 6-10 0 - 5 /HPF   Mucus PRESENT   CBC     Status: Abnormal   Collection Time: 06/10/22  1:43 AM  Result Value Ref Range   WBC 8.7 4.0 - 10.5 K/uL   RBC 3.97 3.87 - 5.11 MIL/uL   Hemoglobin 10.6 (L) 12.0 - 15.0 g/dL   HCT 16.1 (L) 09.6 - 04.5 %   MCV 80.1 80.0 - 100.0 fL   MCH 26.7 26.0 - 34.0 pg   MCHC 33.3 30.0 - 36.0 g/dL   RDW 40.9 81.1 - 91.4 %   Platelets 285 150 - 400 K/uL   nRBC 0.0 0.0 - 0.2 %  Basic metabolic panel     Status: Abnormal   Collection Time: 06/10/22  1:43 AM  Result Value Ref Range   Sodium  136 135 - 145 mmol/L   Potassium 3.6 3.5 - 5.1 mmol/L   Chloride 104 98 - 111 mmol/L   CO2 22 22 - 32 mmol/L   Glucose, Bld 116 (H) 70 - 99 mg/dL   BUN 5 (L) 6 - 20 mg/dL   Creatinine,  Ser 0.65 0.44 - 1.00 mg/dL   Calcium 8.8 (L) 8.9 - 10.3 mg/dL   GFR, Estimated >16 >10 mL/min   Anion gap 10 5 - 15   --/--/B POS (04/17 1631)  IMAGING Pt informed that the ultrasound is considered a limited OB ultrasound and is not intended to be a complete ultrasound exam.  Patient also informed that the ultrasound is not being completed with the intent of assessing for fetal or placental anomalies or any pelvic abnormalities.  Explained that the purpose of today's ultrasound is to assess for viability.  Patient acknowledges the purpose of the exam and the limitations of the study.    Single IUP visualized FHR 160 Fetal movement seen  MAU Management/MDM: I have reviewed the triage vital signs and the nursing notes.   Pertinent labs & imaging results that were available during my care of the patient were reviewed by me and considered in my medical decision making (see chart for details).      I have reviewed her medical records including past results, notes and treatments. Medical, Surgical, and family history were reviewed.  Medications and recent lab tests were reviewed  No leukocytosis, so low suspicion for  appendicitis  Treatments in MAU included zofran which alleviated her nausea.  She is driving so I will not give Flexeril here.  Will prescribe and she can pick it up on the way home..    ASSESSMENT Single IUP at [redacted]w[redacted]d RLQ pain, likely round ligament pain  Low back pain Nausea  PLAN Discharge home Rx Zofran for nausea Rx Flexeril for back pain  Pt stable at time of discharge. Encouraged to return here if she develops worsening of symptoms, increase in pain, fever, or other concerning symptoms.    Wynelle Bourgeois CNM, MSN Certified Nurse-Midwife 06/10/2022  12:54 AM

## 2022-06-11 DIAGNOSIS — Z369 Encounter for antenatal screening, unspecified: Secondary | ICD-10-CM | POA: Diagnosis not present

## 2022-06-11 DIAGNOSIS — R7303 Prediabetes: Secondary | ICD-10-CM | POA: Diagnosis not present

## 2022-06-22 ENCOUNTER — Inpatient Hospital Stay (HOSPITAL_COMMUNITY)
Admission: AD | Admit: 2022-06-22 | Discharge: 2022-06-22 | Disposition: A | Discharge status: Home / Self Care | Payer: Federal, State, Local not specified - PPO | Attending: Obstetrics and Gynecology | Admitting: Obstetrics and Gynecology

## 2022-06-22 ENCOUNTER — Encounter (HOSPITAL_COMMUNITY): Payer: Self-pay | Admitting: Obstetrics and Gynecology

## 2022-06-22 DIAGNOSIS — O26891 Other specified pregnancy related conditions, first trimester: Secondary | ICD-10-CM | POA: Diagnosis not present

## 2022-06-22 DIAGNOSIS — M5459 Other low back pain: Secondary | ICD-10-CM | POA: Insufficient documentation

## 2022-06-22 DIAGNOSIS — Z3A13 13 weeks gestation of pregnancy: Secondary | ICD-10-CM | POA: Diagnosis not present

## 2022-06-22 DIAGNOSIS — O99891 Other specified diseases and conditions complicating pregnancy: Secondary | ICD-10-CM

## 2022-06-22 DIAGNOSIS — O219 Vomiting of pregnancy, unspecified: Secondary | ICD-10-CM | POA: Insufficient documentation

## 2022-06-22 HISTORY — DX: Essential (primary) hypertension: I10

## 2022-06-22 LAB — COMPREHENSIVE METABOLIC PANEL
ALT: 16 U/L (ref 0–44)
AST: 19 U/L (ref 15–41)
Albumin: 3.3 g/dL — ABNORMAL LOW (ref 3.5–5.0)
Alkaline Phosphatase: 48 U/L (ref 38–126)
Anion gap: 9 (ref 5–15)
BUN: 5 mg/dL — ABNORMAL LOW (ref 6–20)
CO2: 23 mmol/L (ref 22–32)
Calcium: 8.9 mg/dL (ref 8.9–10.3)
Chloride: 104 mmol/L (ref 98–111)
Creatinine, Ser: 0.64 mg/dL (ref 0.44–1.00)
GFR, Estimated: >60 mL/min (ref >60)
Glucose, Bld: 90 mg/dL (ref 70–99)
Potassium: 3.8 mmol/L (ref 3.5–5.1)
Sodium: 136 mmol/L (ref 135–145)
Total Bilirubin: 0.4 mg/dL (ref 0.3–1.2)
Total Protein: 6.5 g/dL (ref 6.5–8.1)

## 2022-06-22 LAB — CBC
HCT: 32.5 % — ABNORMAL LOW (ref 36.0–46.0)
Hemoglobin: 11 g/dL — ABNORMAL LOW (ref 12.0–15.0)
MCH: 27.1 pg (ref 26.0–34.0)
MCHC: 33.8 g/dL (ref 30.0–36.0)
MCV: 80 fL (ref 80.0–100.0)
Platelets: 284 10*3/uL (ref 150–400)
RBC: 4.06 MIL/uL (ref 3.87–5.11)
RDW: 14.4 % (ref 11.5–15.5)
WBC: 8.1 10*3/uL (ref 4.0–10.5)
nRBC: 0 % (ref 0.0–0.2)

## 2022-06-22 MED ORDER — METOCLOPRAMIDE HCL 10 MG PO TABS
10.0000 mg | ORAL_TABLET | Freq: Once | ORAL | Status: AC
  Administered 2022-06-22: 10 mg via ORAL
  Filled 2022-06-22: qty 1

## 2022-06-22 MED ORDER — METOCLOPRAMIDE HCL 10 MG PO TABS: 10.0000 mg | ORAL_TABLET | Freq: Four times a day (QID) | ORAL | 1 refills | Status: DC | PRN

## 2022-06-22 MED ORDER — CYCLOBENZAPRINE HCL 5 MG PO TABS: 5.0000 mg | ORAL_TABLET | Freq: Three times a day (TID) | ORAL | 0 refills | Status: DC | PRN

## 2022-06-22 NOTE — MAU Provider Note (Signed)
History     CSN: [id number]  Arrival date and time: 06/22/22 1109     Chief Complaint  Patient presents with   Nausea   Emesis   Back Pain   HPI This is a 37 year old G3 P0-1-1-1 at 13 weeks and 3 days by LMP who presents with nausea and vomiting for the past week.  Today, she states that her symptoms are improved and that she feels less nauseated, although she has not had anything to eat today.  She tried taking Zofran, but this only improved her nausea until she ate - after eating she would become nauseated and vomit.   Her back pain has continued. She tried to pick up the flexeril at the pharmacy, but was told by the pharmacy that it was not safe in pregnancy.  OB History     Gravida  3   Para  1   Term      Preterm  1   AB  1   Living  1      SAB      IAB      Ectopic      Multiple  0   Live Births  1           Past Medical History:  Diagnosis Date   Abnormal Pap smear of cervix    ascus hpv hr+   Fibroid uterus 07/29/2013   Left subserosal 3.5 x 3.1 x 2.5 Left Intramural 1.2 x 1.3 x 1.4     HPV (human papilloma virus) infection    Hypertension    MDD (major depressive disorder)    Migraines    PTSD (post-traumatic stress disorder)     Past Surgical History:  Procedure Laterality Date   CESAREAN SECTION N/A 12/18/2013   Procedure: CESAREAN SECTION;  Surgeon: Levie Heritage, DO;  Location: [facility];  Service: Obstetrics;  Laterality: N/A;   WISDOM TOOTH EXTRACTION      Family History  Problem Relation Age of Onset   Hypertension Father    Cancer Paternal Grandmother        breast cancer   Hypertension Paternal Grandmother    Breast cancer Paternal Grandmother     Social History   Tobacco Use   Smoking status: Former    Packs/day: .5    Types: Cigarettes    Quit date: 01/29/2017    Years since quitting: 5.3   Smokeless tobacco: Never  Vaping Use   Vaping Use: Never used  Substance Use Topics   Alcohol use: Not Currently    Drug use: Not Currently    Allergies: No Known Allergies  Medications Prior to Admission  Medication Sig Dispense Refill Last Dose   metoCLOPramide (REGLAN) 10 MG tablet Take 1 tablet (10 mg total) by mouth every 8 (eight) hours as needed for nausea (or headache). 30 tablet 0 Past Month   NIFEdipine (ADALAT CC) 30 MG 24 hr tablet Take 30 mg by mouth daily.   06/21/2022   Prenatal Vit-Fe Fumarate-FA (PRENATAL MULTIVITAMIN) TABS tablet Take 1 tablet by mouth daily at 12 noon.   06/21/2022   cyclobenzaprine (FLEXERIL) 5 MG tablet Take 1 tablet (5 mg total) by mouth 3 (three) times daily as needed for muscle spasms. 30 tablet 0    ondansetron (ZOFRAN-ODT) 4 MG disintegrating tablet Take 1 tablet (4 mg total) by mouth every 6 (six) hours as needed for nausea. 20 tablet 0 06/20/2022    Review of Systems Physical Exam   Blood pressure Marland Kitchen)  141/92, pulse 64, temperature 98 F (36.7 C), temperature source Oral, resp. rate 17, height 5' 1.5" (1.562 m), weight 84.9 kg, last menstrual period 03/20/2022, SpO2 100 %.  Physical Exam Vitals reviewed.  Constitutional:      Appearance: Normal appearance.  Cardiovascular:     Rate and Rhythm: Normal rate and regular rhythm.  Pulmonary:     Effort: Pulmonary effort is normal.  Abdominal:     General: Abdomen is flat.     Palpations: Abdomen is soft.     Tenderness: There is no abdominal tenderness. There is no guarding.  Skin:    General: Skin is warm and dry.     Capillary Refill: Capillary refill takes less than 2 seconds.  Neurological:     General: No focal deficit present.     Mental Status: She is alert.  Psychiatric:        Mood and Affect: Mood normal.        Behavior: Behavior normal.        Thought Content: Thought content normal.        Judgment: Judgment normal.    Results for orders placed or performed during the hospital encounter of 06/22/22 (from the past 24 hour(s))  CBC     Status: Abnormal   Collection Time: 06/22/22 12:10  PM  Result Value Ref Range   WBC 8.1 4.0 - 10.5 K/uL   RBC 4.06 3.87 - 5.11 MIL/uL   Hemoglobin 11.0 (L) 12.0 - 15.0 g/dL   HCT 16.1 (L) 09.6 - 04.5 %   MCV 80.0 80.0 - 100.0 fL   MCH 27.1 26.0 - 34.0 pg   MCHC 33.8 30.0 - 36.0 g/dL   RDW 40.9 81.1 - 91.4 %   Platelets 284 150 - 400 K/uL   nRBC 0.0 0.0 - 0.2 %     MAU Course  Procedures  MDM Improved with reglan. Patient requesting discharge before other labs resulted.   Assessment and Plan   1. [redacted] weeks gestation of pregnancy   2. Nausea and vomiting during pregnancy   3. Back pain affecting pregnancy in first trimester    Discharge to home. Reglan and flexeril prescribed.  Levie Heritage 06/22/2022, 12:09 PM

## 2022-06-22 NOTE — MAU Note (Signed)
Katrina Garcia is a 37 y.o. at [redacted]w[redacted]d here in MAU reporting: been here before for back pain, was prescribed muscle relaxers- when she went to pick it up at the pharmacy, she was told it wasn't recommended, so she didn't get them.  For the past wk, she has been sick(throwing up and nauseous), today was the first day she really had energy to come in, has been out of work all week.   Onset of complaint: on going Pain score: 8 Vitals:   06/22/22 1132  BP: 139/82  Pulse: 71  Resp: 17  Temp: 98 F (36.7 C)  SpO2: 100%     FHT:158 Lab orders placed from triage:  urine

## 2022-07-07 DIAGNOSIS — Z3482 Encounter for supervision of other normal pregnancy, second trimester: Secondary | ICD-10-CM | POA: Diagnosis not present

## 2022-07-07 DIAGNOSIS — Z369 Encounter for antenatal screening, unspecified: Secondary | ICD-10-CM | POA: Diagnosis not present

## 2022-07-19 ENCOUNTER — Inpatient Hospital Stay (HOSPITAL_COMMUNITY)
Admission: AD | Admit: 2022-07-19 | Discharge: 2022-07-19 | Disposition: A | Discharge status: Home / Self Care | Payer: Federal, State, Local not specified - PPO | Attending: Obstetrics and Gynecology | Admitting: Obstetrics and Gynecology

## 2022-07-19 ENCOUNTER — Other Ambulatory Visit: Payer: Self-pay

## 2022-07-19 DIAGNOSIS — R109 Unspecified abdominal pain: Secondary | ICD-10-CM

## 2022-07-19 DIAGNOSIS — R0989 Other specified symptoms and signs involving the circulatory and respiratory systems: Secondary | ICD-10-CM

## 2022-07-19 DIAGNOSIS — Z3A17 17 weeks gestation of pregnancy: Secondary | ICD-10-CM | POA: Diagnosis not present

## 2022-07-19 DIAGNOSIS — U071 COVID-19: Secondary | ICD-10-CM | POA: Insufficient documentation

## 2022-07-19 DIAGNOSIS — R051 Acute cough: Secondary | ICD-10-CM

## 2022-07-19 DIAGNOSIS — O98512 Other viral diseases complicating pregnancy, second trimester: Secondary | ICD-10-CM | POA: Insufficient documentation

## 2022-07-19 DIAGNOSIS — O26892 Other specified pregnancy related conditions, second trimester: Secondary | ICD-10-CM | POA: Diagnosis not present

## 2022-07-19 MED ORDER — BENZONATATE 100 MG PO CAPS: 200.0000 mg | ORAL_CAPSULE | Freq: Three times a day (TID) | ORAL | 0 refills | Status: DC | PRN

## 2022-07-19 MED ORDER — GUAIFENESIN-CODEINE 200-10 MG/5ML PO LIQD: 10.0000 mL | Freq: Every evening | ORAL | 0 refills | Status: DC | PRN

## 2022-07-19 NOTE — MAU Provider Note (Signed)
Chief Complaint: No chief complaint on file.   Event Date/Time   First Provider Initiated Contact with Patient 07/19/22 1243      SUBJECTIVE HPI: Katrina Garcia is a 37 y.o. [id number] at [redacted]w[redacted]d by LMP who presents to maternity admissions reporting cough, congestion x 3-4 days with positive COVID test at home today.  She reports onset of lower abdominal cramping and back pain today.  She denies sick contacts, fever/chills, or shortness of breath.     HPI  Past Medical History:  Diagnosis Date   Abnormal Pap smear of cervix    ascus hpv hr+   Fibroid uterus 07/29/2013   Left subserosal 3.5 x 3.1 x 2.5 Left Intramural 1.2 x 1.3 x 1.4     HPV (human papilloma virus) infection    Hypertension    MDD (major depressive disorder)    Migraines    PTSD (post-traumatic stress disorder)    Past Surgical History:  Procedure Laterality Date   CESAREAN SECTION N/A 12/18/2013   Procedure: CESAREAN SECTION;  Surgeon: Levie Heritage, DO;  Location: [facility];  Service: Obstetrics;  Laterality: N/A;   WISDOM TOOTH EXTRACTION     Social History   Socioeconomic History   Marital status: Single    Spouse name: Not on file   Number of children: Not on file   Years of education: Not on file   Highest education level: Not on file  Occupational History   Not on file  Tobacco Use   Smoking status: Former    Current packs/day: 0.00    Types: Cigarettes    Quit date: 01/29/2017    Years since quitting: 5.4   Smokeless tobacco: Never  Vaping Use   Vaping status: Never Used  Substance and Sexual Activity   Alcohol use: Not Currently   Drug use: Not Currently   Sexual activity: Yes    Partners: Male  Other Topics Concern   Not on file  Social History Narrative   Not on file   Social Determinants of Health   Financial Resource Strain: Not on file  Food Insecurity: Not on file  Transportation Needs: Not on file  Physical Activity: Not on file  Stress: Not on file  Social Connections: Unknown  (05/09/2021)   Received from [facility]   Social Network    Social Network: Not on file  Intimate Partner Violence: Not At Risk (05/15/2022)   [facility] from [facility]   Humiliation, Afraid, Rape, and Kick questionnaire    Fear of Current or Ex-Partner: No    Emotionally Abused: No    Physically Abused: No    Sexually Abused: No   No current facility-administered medications on file prior to encounter.   Current Outpatient Medications on File Prior to Encounter  Medication Sig Dispense Refill   cyclobenzaprine (FLEXERIL) 5 MG tablet Take 1 tablet (5 mg total) by mouth 3 (three) times daily as needed for muscle spasms. 30 tablet 0   metoCLOPramide (REGLAN) 10 MG tablet Take 1 tablet (10 mg total) by mouth every 6 (six) hours as needed for nausea. 30 tablet 1   NIFEdipine (ADALAT CC) 30 MG 24 hr tablet Take 30 mg by mouth daily.     ondansetron (ZOFRAN-ODT) 4 MG disintegrating tablet Take 1 tablet (4 mg total) by mouth every 6 (six) hours as needed for nausea. 20 tablet 0   Prenatal Vit-Fe Fumarate-FA (PRENATAL MULTIVITAMIN) TABS tablet Take 1 tablet by mouth daily at 12 noon.  No Known Allergies  ROS:  Review of Systems  Constitutional:  Negative for chills and fever.  HENT:  Positive for congestion.   Respiratory:  Positive for cough.   Cardiovascular:  Negative for chest pain.  Gastrointestinal:  Positive for abdominal pain.  Genitourinary:  Negative for dysuria, frequency and urgency.  Musculoskeletal:  Positive for back pain.  Neurological:  Negative for dizziness and headaches.     I have reviewed patient's Past Medical Hx, Surgical Hx, Family Hx, Social Hx, medications and allergies.   Physical Exam  Patient Vitals for the past 24 hrs:  BP Temp Temp src Pulse Resp SpO2 Height Weight  07/19/22 1208 139/83 98.4 F (36.9 C) Oral 87 18 97 % -- --  07/19/22 1200 -- -- -- -- -- -- 5\' 1"  (1.549 m) 84.1 kg   Constitutional: Well-developed, well-nourished female in no  acute distress.  Cardiovascular: normal rate Respiratory: normal effort GI: Abd soft, non-tender. Pos BS x 4 MS: Extremities nontender, no edema, normal ROM Neurologic: Alert and oriented x 4.  GU: Neg CVAT.  PELVIC EXAM: Cervix pink, visually closed, without lesion, scant white creamy discharge, vaginal walls and external genitalia normal Bimanual exam: Cervix 0/long/high, firm, anterior, neg CMT, uterus nontender, nonenlarged, adnexa without tenderness, enlargement, or mass  FHT 158 by doppler  LAB RESULTS No results found for this or any previous visit (from the past 24 hour(s)).  --/--/B POS (04/17 1631)  IMAGING No results found.  MAU Management/MDM: Orders Placed This Encounter  Procedures   Discharge patient    Meds ordered this encounter  Medications   guaiFENesin-Codeine 200-10 MG/5ML LIQD    Sig: Take 10 mLs by mouth at bedtime as needed and may repeat dose one time if needed.    Dispense:  210 mL    Refill:  0    Order Specific Question:   Supervising Provider    Answer:   Jaynie Collins A [[id number]]   benzonatate (TESSALON PERLES) 100 MG capsule    Sig: Take 2 capsules (200 mg total) by mouth 3 (three) times daily as needed for cough.    Dispense:  20 capsule    Refill:  0    Order Specific Question:   Supervising Provider    Answer:   Jaynie Collins A [[id number]]    No emergent findings, pt symptomatic with URI, COVID positive.  Cervix closed/thick, no evidence of PTL.  Normal FHT.  Cough is keeping pt from sleeping.  Rx for cough syrup with codeine for limited use.  Unsure of insurance coverage so Tessalon perles sent to pharmacy as well. List of safe OTC medications given.  Rest, increase PO fluids. Letter provided for pt to be out of work until 07/21/22.   F/U in office as scheduled. Return to MAU as needed for emergencies.   ASSESSMENT 1. COVID-19 affecting pregnancy in second trimester   2. [redacted] weeks gestation of pregnancy   3. Acute cough   4. Chest congestion    5. Abdominal pain during pregnancy, second trimester     PLAN Discharge home Allergies as of 07/19/2022   No Known Allergies      Medication List     TAKE these medications    benzonatate 100 MG capsule Commonly known as: Tessalon Perles Take 2 capsules (200 mg total) by mouth 3 (three) times daily as needed for cough.   cyclobenzaprine 5 MG tablet Commonly known as: FLEXERIL Take 1 tablet (5 mg total) by mouth 3 (three) times  daily as needed for muscle spasms.   guaiFENesin-Codeine 200-10 MG/5ML Liqd Take 10 mLs by mouth at bedtime as needed and may repeat dose one time if needed.   metoCLOPramide 10 MG tablet Commonly known as: Reglan Take 1 tablet (10 mg total) by mouth every 6 (six) hours as needed for nausea.   NIFEdipine 30 MG 24 hr tablet Commonly known as: ADALAT CC Take 30 mg by mouth daily.   ondansetron 4 MG disintegrating tablet Commonly known as: ZOFRAN-ODT Take 1 tablet (4 mg total) by mouth every 6 (six) hours as needed for nausea.   prenatal multivitamin Tabs tablet Take 1 tablet by mouth daily at 12 noon.        Follow-up Information     [facility] Follow up.   Why: As scheduled Contact information: [address]  [address] [place] [address] [phone]         [facility] Follow up.   Specialty: Obstetrics and Gynecology Why: As needed for emergencies Contact information: [address] [id number] [place] [address] [phone]                Sharen Counter Certified Nurse-Midwife 07/19/2022  12:55 PM

## 2022-07-19 NOTE — MAU Note (Signed)
Katrina Garcia is a 37 y.o. at [redacted]w[redacted]d here in MAU reporting: had +Covid test today, 30 minutes prior to arrival to hospital.   Reports she has a cough and congestion, states cough has been productive @ times.  States she can hear a rattling in her chest.  Also c/o lower abdominal and back pain.   Reports hasn't taken any meds to treat pain, pain started after arrival to hospital.  Denies VB or LOF. LMP: 03/20/2022 Onset of complaint: today Pain score: 6 Vitals:   07/19/22 1208  BP: 139/83  Pulse: 87  Resp: 18  Temp: 98.4 F (36.9 C)  SpO2: 97%     FHT:158 bpm Lab orders placed from triage:   None

## 2022-07-19 NOTE — Discharge Instructions (Signed)

## 2022-08-03 ENCOUNTER — Inpatient Hospital Stay (HOSPITAL_COMMUNITY)
Admission: AD | Admit: 2022-08-03 | Discharge: 2022-08-03 | Disposition: A | Discharge status: Home / Self Care | Payer: Federal, State, Local not specified - PPO | Attending: Obstetrics and Gynecology | Admitting: Obstetrics and Gynecology

## 2022-08-03 DIAGNOSIS — Z3A19 19 weeks gestation of pregnancy: Secondary | ICD-10-CM | POA: Diagnosis not present

## 2022-08-03 DIAGNOSIS — M25511 Pain in right shoulder: Secondary | ICD-10-CM

## 2022-08-03 DIAGNOSIS — G8929 Other chronic pain: Secondary | ICD-10-CM

## 2022-08-03 DIAGNOSIS — Z87891 Personal history of nicotine dependence: Secondary | ICD-10-CM | POA: Insufficient documentation

## 2022-08-03 DIAGNOSIS — R202 Paresthesia of skin: Secondary | ICD-10-CM | POA: Diagnosis not present

## 2022-08-03 DIAGNOSIS — O26892 Other specified pregnancy related conditions, second trimester: Secondary | ICD-10-CM | POA: Insufficient documentation

## 2022-08-03 DIAGNOSIS — O9A212 Injury, poisoning and certain other consequences of external causes complicating pregnancy, second trimester: Secondary | ICD-10-CM | POA: Diagnosis not present

## 2022-08-03 MED ORDER — CYCLOBENZAPRINE HCL 5 MG PO TABS: 5.0000 mg | ORAL_TABLET | Freq: Three times a day (TID) | ORAL | 0 refills | Status: DC | PRN

## 2022-08-03 NOTE — Discharge Instructions (Signed)
It was wonderful seeing you today.  I am sorry your shoulder pain has persisted this long.  I will send a prescription for a muscle relaxer and and have placed a referral for orthopedics to see you.  Someone should call you to schedule this appointment.  If your symptoms worsen, you have weakening in your hand please return for further evaluation.  I hope you get to feeling better.

## 2022-08-03 NOTE — MAU Note (Signed)
.  Katrina Garcia is a 37 y.o. at [redacted]w[redacted]d here in MAU reporting: she injured her right chest area about a month ago . States she was lifting a water jug for a water dispenser and dropped it against her right chest. States she has had pain off and on since then. Used tylenol and heat/ice to area as needed. Since Saturday she has noticed numbness and tingling in her right hand and arm. NAD. Denies abd pain, bleeding or leaking fluid.   Onset of complaint: 2 days ago Pain score: 10/10 right chest                       8/10 right arm Vitals:   08/03/22 1808  BP: (!) 158/94  Pulse: 72  Resp: 16  Temp: 98 F (36.7 C)  SpO2: 99%     FHT:145 Lab orders placed from triage:

## 2022-08-03 NOTE — MAU Provider Note (Signed)
History     CSN: [id number]  Arrival date and time: 08/03/22 1715   Event Date/Time   First Provider Initiated Contact with Patient 08/03/22 1825      Chief Complaint  Patient presents with   Chest Injury   Numbness   HPI Patient is a 37 year old G3 P0-1-1-1 at 19 weeks 3 days presenting to the MAU for right shoulder pain.  Patient reports that a month and a half ago she had a large water jug that fell against her right shoulder/chest and she has been experiencing consistent pain in that right shoulder since then.  She is also had tingling which started in her fingers and has radiated up her wrist to her elbow.  Reports that she still has sensation and feeling but persistent tingling as well.  Reports it is worse in the morning and gets better throughout the day.   OB History     Gravida  3   Para  1   Term      Preterm  1   AB  1   Living  1      SAB      IAB      Ectopic      Multiple  0   Live Births  1           Past Medical History:  Diagnosis Date   Abnormal Pap smear of cervix    ascus hpv hr+   Fibroid uterus 07/29/2013   Left subserosal 3.5 x 3.1 x 2.5 Left Intramural 1.2 x 1.3 x 1.4     HPV (human papilloma virus) infection    Hypertension    MDD (major depressive disorder)    Migraines    PTSD (post-traumatic stress disorder)     Past Surgical History:  Procedure Laterality Date   CESAREAN SECTION N/A 12/18/2013   Procedure: CESAREAN SECTION;  Surgeon: Levie Heritage, DO;  Location: [facility];  Service: Obstetrics;  Laterality: N/A;   WISDOM TOOTH EXTRACTION      Family History  Problem Relation Age of Onset   Hypertension Father    Cancer Paternal Grandmother        breast cancer   Hypertension Paternal Grandmother    Breast cancer Paternal Grandmother     Social History   Tobacco Use   Smoking status: Former    Current packs/day: 0.00    Types: Cigarettes    Quit date: 01/29/2017    Years since quitting: 5.5   Smokeless  tobacco: Never  Vaping Use   Vaping status: Never Used  Substance Use Topics   Alcohol use: Not Currently   Drug use: Not Currently    Allergies: No Known Allergies  Medications Prior to Admission  Medication Sig Dispense Refill Last Dose   benzonatate (TESSALON PERLES) 100 MG capsule Take 2 capsules (200 mg total) by mouth 3 (three) times daily as needed for cough. 20 capsule 0    guaiFENesin-Codeine 200-10 MG/5ML LIQD Take 10 mLs by mouth at bedtime as needed and may repeat dose one time if needed. 210 mL 0    metoCLOPramide (REGLAN) 10 MG tablet Take 1 tablet (10 mg total) by mouth every 6 (six) hours as needed for nausea. 30 tablet 1    NIFEdipine (ADALAT CC) 30 MG 24 hr tablet Take 30 mg by mouth daily.      ondansetron (ZOFRAN-ODT) 4 MG disintegrating tablet Take 1 tablet (4 mg total) by mouth every 6 (six) hours as needed  for nausea. 20 tablet 0    Prenatal Vit-Fe Fumarate-FA (PRENATAL MULTIVITAMIN) TABS tablet Take 1 tablet by mouth daily at 12 noon.      [DISCONTINUED] cyclobenzaprine (FLEXERIL) 5 MG tablet Take 1 tablet (5 mg total) by mouth 3 (three) times daily as needed for muscle spasms. 30 tablet 0     Review of Systems  Constitutional:  Negative for fever.  HENT:  Negative for congestion and rhinorrhea.   Eyes:  Negative for visual disturbance.  Cardiovascular:  Negative for chest pain.  Gastrointestinal:  Negative for abdominal pain, diarrhea, nausea and vomiting.  Endocrine: Negative for polyuria.  Genitourinary:  Negative for vaginal bleeding, vaginal discharge and vaginal pain.  Musculoskeletal:  Positive for arthralgias and myalgias. Negative for neck pain and neck stiffness.  Neurological:  Negative for weakness and headaches.   Physical Exam   Blood pressure (!) 158/94, pulse 72, temperature 98 F (36.7 C), temperature source Oral, resp. rate 16, height 5\' 1"  (1.549 m), weight 85.9 kg, last menstrual period 03/20/2022, SpO2 99%.  Physical Exam Vitals  reviewed.  Constitutional:      Appearance: Normal appearance.  HENT:     Head: Normocephalic.     Mouth/Throat:     Mouth: Mucous membranes are moist.  Eyes:     Pupils: Pupils are equal, round, and reactive to light.  Cardiovascular:     Rate and Rhythm: Normal rate.  Pulmonary:     Effort: Pulmonary effort is normal.  Musculoskeletal:        General: Tenderness (Tenderness of anterior glenohumeral joint) present.     Cervical back: Normal range of motion.  Skin:    General: Skin is warm.     Capillary Refill: Capillary refill takes less than 2 seconds.     Comments: Pulses equal bilateral upper extremities  Neurological:     Mental Status: She is alert.     Cranial Nerves: No cranial nerve deficit.     Comments: Patient reports tingling and right upper extremity.  Reports she can feel pinching, pain, touch on both upper extremities     MAU Course  Procedures  MDM Physical exam   Assessment and Plan  Darilyn Nolte is a 37 year old [id number] presenting for right shoulder pain and tingling in the right hand for the past month.  Right shoulder pain Chronic right shoulder pain.  Muscular tenderness.  Recommended continued NSAIDs, ice, heat.  Patient would like referral for orthopedics.  Referral placed.  Discussed tricked return precautions  Tingling in the right hand Full range of motion, full strength in the right upper extremity.  Patient reports tingling to the elbow which originally started in the fingertips.  Only on the right upper extremity and started right after the incident on her chest.  Physical exam consistent with carpal tunnel.  Discussed return precautions but patient to discuss issues with orthopedics when she sees them.  Celedonio Savage 08/03/2022, 6:34 PM

## 2022-08-04 ENCOUNTER — Inpatient Hospital Stay (HOSPITAL_COMMUNITY)
Admission: AD | Admit: 2022-08-04 | Discharge: 2022-08-04 | Disposition: A | Discharge status: Home / Self Care | Payer: Federal, State, Local not specified - PPO | Attending: Obstetrics and Gynecology | Admitting: Obstetrics and Gynecology

## 2022-08-04 ENCOUNTER — Encounter (HOSPITAL_COMMUNITY): Payer: Self-pay | Admitting: Obstetrics and Gynecology

## 2022-08-04 DIAGNOSIS — Z3A19 19 weeks gestation of pregnancy: Secondary | ICD-10-CM | POA: Diagnosis not present

## 2022-08-04 DIAGNOSIS — O10912 Unspecified pre-existing hypertension complicating pregnancy, second trimester: Secondary | ICD-10-CM | POA: Diagnosis not present

## 2022-08-04 DIAGNOSIS — O112 Pre-existing hypertension with pre-eclampsia, second trimester: Secondary | ICD-10-CM | POA: Diagnosis not present

## 2022-08-04 DIAGNOSIS — O10919 Unspecified pre-existing hypertension complicating pregnancy, unspecified trimester: Secondary | ICD-10-CM

## 2022-08-04 DIAGNOSIS — O26892 Other specified pregnancy related conditions, second trimester: Secondary | ICD-10-CM | POA: Insufficient documentation

## 2022-08-04 LAB — URINALYSIS, ROUTINE W REFLEX MICROSCOPIC
Bilirubin Urine: NEGATIVE
Glucose, UA: NEGATIVE mg/dL
Hgb urine dipstick: NEGATIVE
Ketones, ur: NEGATIVE mg/dL
Leukocytes,Ua: NEGATIVE
Nitrite: NEGATIVE
Protein, ur: NEGATIVE mg/dL
Specific Gravity, Urine: 1.018 (ref 1.005–1.030)
pH: 6 (ref 5.0–8.0)

## 2022-08-04 LAB — COMPREHENSIVE METABOLIC PANEL
ALT: 12 U/L (ref 0–44)
AST: 15 U/L (ref 15–41)
Albumin: 3 g/dL — ABNORMAL LOW (ref 3.5–5.0)
Alkaline Phosphatase: 52 U/L (ref 38–126)
Anion gap: 7 (ref 5–15)
BUN: 5 mg/dL — ABNORMAL LOW (ref 6–20)
CO2: 25 mmol/L (ref 22–32)
Calcium: 8.8 mg/dL — ABNORMAL LOW (ref 8.9–10.3)
Chloride: 105 mmol/L (ref 98–111)
Creatinine, Ser: 0.62 mg/dL (ref 0.44–1.00)
GFR, Estimated: >60 mL/min (ref >60)
Glucose, Bld: 110 mg/dL — ABNORMAL HIGH (ref 70–99)
Potassium: 3.9 mmol/L (ref 3.5–5.1)
Sodium: 137 mmol/L (ref 135–145)
Total Bilirubin: 0.4 mg/dL (ref 0.3–1.2)
Total Protein: 6.1 g/dL — ABNORMAL LOW (ref 6.5–8.1)

## 2022-08-04 LAB — PROTEIN / CREATININE RATIO, URINE
Creatinine, Urine: 182 mg/dL
Protein Creatinine Ratio: 0.04 mg/mg{Cre} (ref 0.00–0.15)
Total Protein, Urine: 8 mg/dL

## 2022-08-04 LAB — CBC
HCT: 32.9 % — ABNORMAL LOW (ref 36.0–46.0)
Hemoglobin: 10.8 g/dL — ABNORMAL LOW (ref 12.0–15.0)
MCH: 26 pg (ref 26.0–34.0)
MCHC: 32.8 g/dL (ref 30.0–36.0)
MCV: 79.3 fL — ABNORMAL LOW (ref 80.0–100.0)
Platelets: 292 10*3/uL (ref 150–400)
RBC: 4.15 MIL/uL (ref 3.87–5.11)
RDW: 13.2 % (ref 11.5–15.5)
WBC: 11.3 10*3/uL — ABNORMAL HIGH (ref 4.0–10.5)
nRBC: 0 % (ref 0.0–0.2)

## 2022-08-04 MED ORDER — ACETAMINOPHEN 325 MG PO TABS
650.0000 mg | ORAL_TABLET | Freq: Once | ORAL | Status: AC
  Administered 2022-08-04: 650 mg via ORAL
  Filled 2022-08-04: qty 2

## 2022-08-04 MED ORDER — NIFEDIPINE ER 30 MG PO TB24: 60.0000 mg | ORAL_TABLET | Freq: Every day | ORAL | Status: DC

## 2022-08-04 NOTE — MAU Provider Note (Signed)
History     CSN: [id number]  Arrival date and time: 08/04/22 1745   Event Date/Time   First Provider Initiated Contact with Patient 08/04/22 1820      No chief complaint on file.  Katrina Garcia is a 37 y.o. [id number] at [redacted]w[redacted]d who presents with 1 days of right arm tingling, nausea, vision changes, and headache. She has not taken tylenol since yesterday and did not have a headache. She states she has had blood pressure issues since the birth of her son and felt like it was elevated. She states she takes procardia 30 mg daily. She states she sees Dr. Claiborne Billings for her OB care. She denies chest pain but endorses shortness of breath since COVID infection that has been stable.    OB History     Gravida  3   Para  1   Term      Preterm  1   AB  1   Living  1      SAB      IAB      Ectopic      Multiple  0   Live Births  1           Past Medical History:  Diagnosis Date   Abnormal Pap smear of cervix    ascus hpv hr+   Fibroid uterus 07/29/2013   Left subserosal 3.5 x 3.1 x 2.5 Left Intramural 1.2 x 1.3 x 1.4     HPV (human papilloma virus) infection    Hypertension    MDD (major depressive disorder)    Migraines    PTSD (post-traumatic stress disorder)     Past Surgical History:  Procedure Laterality Date   CESAREAN SECTION N/A 12/18/2013   Procedure: CESAREAN SECTION;  Surgeon: Levie Heritage, DO;  Location: [facility];  Service: Obstetrics;  Laterality: N/A;   WISDOM TOOTH EXTRACTION      Family History  Problem Relation Age of Onset   Hypertension Father    Cancer Paternal Grandmother        breast cancer   Hypertension Paternal Grandmother    Breast cancer Paternal Grandmother     Social History   Tobacco Use   Smoking status: Former    Current packs/day: 0.00    Types: Cigarettes    Quit date: 01/29/2017    Years since quitting: 5.5   Smokeless tobacco: Never  Vaping Use   Vaping status: Never Used  Substance Use Topics   Alcohol use: Not  Currently   Drug use: Not Currently    Allergies: No Known Allergies  Medications Prior to Admission  Medication Sig Dispense Refill Last Dose   NIFEdipine (ADALAT CC) 30 MG 24 hr tablet Take 30 mg by mouth daily.   08/04/2022   Prenatal Vit-Fe Fumarate-FA (PRENATAL MULTIVITAMIN) TABS tablet Take 1 tablet by mouth daily at 12 noon.   08/04/2022   benzonatate (TESSALON PERLES) 100 MG capsule Take 2 capsules (200 mg total) by mouth 3 (three) times daily as needed for cough. 20 capsule 0    cyclobenzaprine (FLEXERIL) 5 MG tablet Take 1 tablet (5 mg total) by mouth 3 (three) times daily as needed for muscle spasms. 30 tablet 0    guaiFENesin-Codeine 200-10 MG/5ML LIQD Take 10 mLs by mouth at bedtime as needed and may repeat dose one time if needed. 210 mL 0    metoCLOPramide (REGLAN) 10 MG tablet Take 1 tablet (10 mg total) by mouth every 6 (six) hours as needed  for nausea. 30 tablet 1    ondansetron (ZOFRAN-ODT) 4 MG disintegrating tablet Take 1 tablet (4 mg total) by mouth every 6 (six) hours as needed for nausea. 20 tablet 0     Review of Systems  Constitutional:  Negative for activity change and appetite change.  Respiratory:  Positive for shortness of breath.   Cardiovascular:  Positive for leg swelling. Negative for chest pain.  Gastrointestinal:  Negative for abdominal pain.  Genitourinary:  Positive for pelvic pain. Negative for vaginal bleeding.  Neurological:  Positive for headaches.   Physical Exam   Blood pressure (!) 148/94, pulse 93, resp. rate 18, height 5\' 1"  (1.549 m), weight 86.2 kg, last menstrual period 03/20/2022, SpO2 100%.  Physical Exam Constitutional:      General: She is not in acute distress.    Appearance: Normal appearance. She is not ill-appearing, toxic-appearing or diaphoretic.  Eyes:     Extraocular Movements: Extraocular movements intact.     Conjunctiva/sclera: Conjunctivae normal.  Cardiovascular:     Rate and Rhythm: Normal rate and regular rhythm.      Heart sounds: Normal heart sounds.  Pulmonary:     Effort: Pulmonary effort is normal.     Breath sounds: Normal breath sounds.  Abdominal:     Palpations: Abdomen is soft.     Tenderness: There is no abdominal tenderness. There is no guarding or rebound.  Musculoskeletal:     Right lower leg: Edema present.     Left lower leg: Edema present.     Comments: Mild LE edema  Neurological:     Mental Status: She is alert and oriented to person, place, and time.  Psychiatric:        Mood and Affect: Mood normal.        Behavior: Behavior normal.     MAU Course  Procedures  MDM Results for orders placed or performed during the hospital encounter of 08/04/22 (from the past 24 hour(s))  CBC     Status: Abnormal   Collection Time: 08/04/22  7:01 PM  Result Value Ref Range   WBC 11.3 (H) 4.0 - 10.5 K/uL   RBC 4.15 3.87 - 5.11 MIL/uL   Hemoglobin 10.8 (L) 12.0 - 15.0 g/dL   HCT 16.1 (L) 09.6 - 04.5 %   MCV 79.3 (L) 80.0 - 100.0 fL   MCH 26.0 26.0 - 34.0 pg   MCHC 32.8 30.0 - 36.0 g/dL   RDW 40.9 81.1 - 91.4 %   Platelets 292 150 - 400 K/uL   nRBC 0.0 0.0 - 0.2 %  Comprehensive metabolic panel     Status: Abnormal   Collection Time: 08/04/22  7:01 PM  Result Value Ref Range   Sodium 137 135 - 145 mmol/L   Potassium 3.9 3.5 - 5.1 mmol/L   Chloride 105 98 - 111 mmol/L   CO2 25 22 - 32 mmol/L   Glucose, Bld 110 (H) 70 - 99 mg/dL   BUN 5 (L) 6 - 20 mg/dL   Creatinine, Ser 7.82 0.44 - 1.00 mg/dL   Calcium 8.8 (L) 8.9 - 10.3 mg/dL   Total Protein 6.1 (L) 6.5 - 8.1 g/dL   Albumin 3.0 (L) 3.5 - 5.0 g/dL   AST 15 15 - 41 U/L   ALT 12 0 - 44 U/L   Alkaline Phosphatase 52 38 - 126 U/L   Total Bilirubin 0.4 0.3 - 1.2 mg/dL   GFR, Estimated >95 >62 mL/min   Anion gap 7 5 -  15  Urinalysis, Routine w reflex microscopic -Urine, Clean Catch     Status: None   Collection Time: 08/04/22  7:10 PM  Result Value Ref Range   Color, Urine YELLOW YELLOW   APPearance CLEAR CLEAR   Specific  Gravity, Urine 1.018 1.005 - 1.030   pH 6.0 5.0 - 8.0   Glucose, UA NEGATIVE NEGATIVE mg/dL   Hgb urine dipstick NEGATIVE NEGATIVE   Bilirubin Urine NEGATIVE NEGATIVE   Ketones, ur NEGATIVE NEGATIVE mg/dL   Protein, ur NEGATIVE NEGATIVE mg/dL   Nitrite NEGATIVE NEGATIVE   Leukocytes,Ua NEGATIVE NEGATIVE  Protein / creatinine ratio, urine     Status: None   Collection Time: 08/04/22  7:10 PM  Result Value Ref Range   Creatinine, Urine 182 mg/dL   Total Protein, Urine 8 mg/dL   Protein Creatinine Ratio 0.04 0.00 - 0.15 mg/mg[Cre]     Assessment and Plan  1. Chronic hypertension affecting pregnancy Headache improved with tylenol. Normal preE labs. Procardia increased to 60 mg daily. Has follow up with OB 8/14. Return precautions discussed.   2. [redacted] weeks gestation of pregnancy - Discharge patient    Lavonda Jumbo 08/04/2022, 6:20 PM

## 2022-08-04 NOTE — MAU Note (Signed)
.  Katrina Garcia is a 37 y.o. at [redacted]w[redacted]d here in MAU reporting: she has had tingling in right arm since yesterday. Was her yesterday and was told she had carpule tunnel. Stated the tingling is worse and having pain and weakness in the arm and stated she had felt chest pain that hurts with changing positions and discomfort since yesterday. Pt also c/o nausea and abd pain. Pt is scarred something is wrong.   Onset of complaint: yesterday Pain score: 4-7 Vitals:   08/04/22 1807  BP: (!) 148/94  Pulse: 93  Resp: 18     FHT:155 Lab orders placed from triage:  u/a

## 2022-08-16 DIAGNOSIS — Z3A20 20 weeks gestation of pregnancy: Secondary | ICD-10-CM | POA: Diagnosis not present

## 2022-08-16 DIAGNOSIS — Z3A26 26 weeks gestation of pregnancy: Secondary | ICD-10-CM | POA: Diagnosis not present

## 2022-08-16 DIAGNOSIS — R202 Paresthesia of skin: Secondary | ICD-10-CM | POA: Diagnosis not present

## 2022-08-16 DIAGNOSIS — M79601 Pain in right arm: Secondary | ICD-10-CM | POA: Diagnosis not present

## 2022-08-16 DIAGNOSIS — R1032 Left lower quadrant pain: Secondary | ICD-10-CM | POA: Diagnosis not present

## 2022-08-16 DIAGNOSIS — K5900 Constipation, unspecified: Secondary | ICD-10-CM | POA: Diagnosis not present

## 2022-08-16 DIAGNOSIS — R1012 Left upper quadrant pain: Secondary | ICD-10-CM | POA: Diagnosis not present

## 2022-08-16 DIAGNOSIS — O26892 Other specified pregnancy related conditions, second trimester: Secondary | ICD-10-CM | POA: Diagnosis not present

## 2022-08-16 DIAGNOSIS — M5412 Radiculopathy, cervical region: Secondary | ICD-10-CM | POA: Diagnosis not present

## 2022-08-16 DIAGNOSIS — O26891 Other specified pregnancy related conditions, first trimester: Secondary | ICD-10-CM | POA: Diagnosis not present

## 2022-08-16 DIAGNOSIS — Z79899 Other long term (current) drug therapy: Secondary | ICD-10-CM | POA: Diagnosis not present

## 2022-08-16 DIAGNOSIS — K219 Gastro-esophageal reflux disease without esophagitis: Secondary | ICD-10-CM | POA: Diagnosis not present

## 2022-08-16 DIAGNOSIS — I10 Essential (primary) hypertension: Secondary | ICD-10-CM | POA: Diagnosis not present

## 2022-08-19 DIAGNOSIS — Z3A21 21 weeks gestation of pregnancy: Secondary | ICD-10-CM | POA: Diagnosis not present

## 2022-08-19 DIAGNOSIS — Z363 Encounter for antenatal screening for malformations: Secondary | ICD-10-CM | POA: Diagnosis not present

## 2022-08-21 ENCOUNTER — Encounter (HOSPITAL_COMMUNITY): Payer: Self-pay | Admitting: Obstetrics and Gynecology

## 2022-08-21 ENCOUNTER — Inpatient Hospital Stay (HOSPITAL_COMMUNITY)
Admission: AD | Admit: 2022-08-21 | Discharge: 2022-08-21 | Disposition: A | Discharge status: Home / Self Care | Payer: Federal, State, Local not specified - PPO | Attending: Obstetrics and Gynecology | Admitting: Obstetrics and Gynecology

## 2022-08-21 DIAGNOSIS — M5459 Other low back pain: Secondary | ICD-10-CM | POA: Insufficient documentation

## 2022-08-21 DIAGNOSIS — O10012 Pre-existing essential hypertension complicating pregnancy, second trimester: Secondary | ICD-10-CM | POA: Insufficient documentation

## 2022-08-21 DIAGNOSIS — Z3A22 22 weeks gestation of pregnancy: Secondary | ICD-10-CM

## 2022-08-21 DIAGNOSIS — R1033 Periumbilical pain: Secondary | ICD-10-CM | POA: Diagnosis not present

## 2022-08-21 DIAGNOSIS — Z87891 Personal history of nicotine dependence: Secondary | ICD-10-CM | POA: Diagnosis not present

## 2022-08-21 DIAGNOSIS — Y9259 Other trade areas as the place of occurrence of the external cause: Secondary | ICD-10-CM | POA: Diagnosis not present

## 2022-08-21 DIAGNOSIS — Y990 Civilian activity done for income or pay: Secondary | ICD-10-CM | POA: Diagnosis not present

## 2022-08-21 DIAGNOSIS — Z79899 Other long term (current) drug therapy: Secondary | ICD-10-CM | POA: Diagnosis not present

## 2022-08-21 DIAGNOSIS — O9A212 Injury, poisoning and certain other consequences of external causes complicating pregnancy, second trimester: Secondary | ICD-10-CM

## 2022-08-21 DIAGNOSIS — I10 Essential (primary) hypertension: Secondary | ICD-10-CM

## 2022-08-21 LAB — URINALYSIS, ROUTINE W REFLEX MICROSCOPIC
Bilirubin Urine: NEGATIVE
Glucose, UA: NEGATIVE mg/dL
Hgb urine dipstick: NEGATIVE
Ketones, ur: NEGATIVE mg/dL
Leukocytes,Ua: NEGATIVE
Nitrite: NEGATIVE
Protein, ur: NEGATIVE mg/dL
Specific Gravity, Urine: 1.015 (ref 1.005–1.030)
pH: 6 (ref 5.0–8.0)

## 2022-08-21 MED ORDER — NIFEDIPINE ER OSMOTIC RELEASE 30 MG PO TB24
30.0000 mg | ORAL_TABLET | Freq: Once | ORAL | Status: AC
  Administered 2022-08-21: 30 mg via ORAL
  Filled 2022-08-21: qty 1

## 2022-08-21 MED ORDER — ACETAMINOPHEN 500 MG PO TABS
1000.0000 mg | ORAL_TABLET | Freq: Once | ORAL | Status: AC
  Administered 2022-08-21: 1000 mg via ORAL
  Filled 2022-08-21: qty 2
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
## 2022-09-11 ENCOUNTER — Encounter (HOSPITAL_COMMUNITY): Payer: Self-pay | Admitting: Obstetrics and Gynecology

## 2022-09-11 ENCOUNTER — Inpatient Hospital Stay (HOSPITAL_COMMUNITY)
Admission: AD | Admit: 2022-09-11 | Discharge: 2022-09-12 | Disposition: A | Discharge status: Home / Self Care | Payer: Federal, State, Local not specified - PPO | Attending: Obstetrics and Gynecology | Admitting: Obstetrics and Gynecology

## 2022-09-11 DIAGNOSIS — O10912 Unspecified pre-existing hypertension complicating pregnancy, second trimester: Secondary | ICD-10-CM | POA: Insufficient documentation

## 2022-09-11 DIAGNOSIS — Z3A25 25 weeks gestation of pregnancy: Secondary | ICD-10-CM | POA: Insufficient documentation

## 2022-09-11 DIAGNOSIS — O479 False labor, unspecified: Secondary | ICD-10-CM | POA: Diagnosis not present

## 2022-09-11 DIAGNOSIS — O26892 Other specified pregnancy related conditions, second trimester: Secondary | ICD-10-CM | POA: Insufficient documentation

## 2022-09-11 DIAGNOSIS — O4702 False labor before 37 completed weeks of gestation, second trimester: Secondary | ICD-10-CM | POA: Diagnosis not present

## 2022-09-11 DIAGNOSIS — M549 Dorsalgia, unspecified: Secondary | ICD-10-CM | POA: Diagnosis not present

## 2022-09-11 DIAGNOSIS — O10919 Unspecified pre-existing hypertension complicating pregnancy, unspecified trimester: Secondary | ICD-10-CM

## 2022-09-11 LAB — URINALYSIS, ROUTINE W REFLEX MICROSCOPIC
Bacteria, UA: NONE SEEN
Bilirubin Urine: NEGATIVE
Glucose, UA: NEGATIVE mg/dL
Hgb urine dipstick: NEGATIVE
Ketones, ur: 20 mg/dL — AB
Leukocytes,Ua: NEGATIVE
Nitrite: NEGATIVE
Protein, ur: NEGATIVE mg/dL
Specific Gravity, Urine: 1.02 (ref 1.005–1.030)
pH: 5 (ref 5.0–8.0)

## 2022-09-11 MED ORDER — NIFEDIPINE ER OSMOTIC RELEASE 30 MG PO TB24
30.0000 mg | ORAL_TABLET | Freq: Every day | ORAL | Status: DC
  Administered 2022-09-11: 30 mg via ORAL
  Filled 2022-09-11: qty 1

## 2022-09-11 NOTE — MAU Provider Note (Signed)
Chief Complaint:  Back Pain and Abdominal Pain   Event Date/Time   First Provider Initiated Contact with Patient 09/11/22 2244     HPI  HPI: Katrina Garcia is a 37 y.o. [id number] at 7w0dwho presents to maternity admissions reporting lower abdominal and back pain since an hour prior to arrival after chasing her 20 yo son around the house. Pain has greatly improved since arrival. Hx of CHTN on Procardia 30 BID, did not take evening dose today due to being busy and coming here.  She reports good fetal movement, denies LOF, vaginal bleeding, vaginal itching/burning, urinary symptoms, h/a, dizziness, n/v, diarrhea, constipation or fever/chills.  She denies headache, visual changes or RUQ abdominal pain.   Past Medical History: Past Medical History:  Diagnosis Date   Abnormal Pap smear of cervix    ascus hpv hr+   Fibroid uterus 07/29/2013   Left subserosal 3.5 x 3.1 x 2.5 Left Intramural 1.2 x 1.3 x 1.4     HPV (human papilloma virus) infection    Hypertension    MDD (major depressive disorder)    Migraines    PTSD (post-traumatic stress disorder)     Past obstetric history: OB History  Gravida Para Term Preterm AB Living  3 1   1 1 1   SAB IAB Ectopic Multiple Live Births        0 1    # Outcome Date GA Lbr Len/2nd Weight Sex Type Anes PTL Lv  3 Current           2 Preterm 12/18/13 [redacted]w[redacted]d  2631 g M CS-LTranv EPI  LIV  1 AB             Past Surgical History: Past Surgical History:  Procedure Laterality Date   CESAREAN SECTION N/A 12/18/2013   Procedure: CESAREAN SECTION;  Surgeon: Levie Heritage, DO;  Location: [facility];  Service: Obstetrics;  Laterality: N/A;   WISDOM TOOTH EXTRACTION      Family History: Family History  Problem Relation Age of Onset   Hypertension Father    Cancer Paternal Grandmother        breast cancer   Hypertension Paternal Grandmother    Breast cancer Paternal Grandmother     Social History: Social History   Tobacco Use   Smoking status: Former     Current packs/day: 0.00    Types: Cigarettes    Quit date: 01/29/2017    Years since quitting: 5.6   Smokeless tobacco: Never  Vaping Use   Vaping status: Never Used  Substance Use Topics   Alcohol use: Not Currently   Drug use: Not Currently    Allergies: No Known Allergies  Meds:  Medications Prior to Admission  Medication Sig Dispense Refill Last Dose   aspirin 81 MG chewable tablet Chew 81 mg by mouth daily.   09/11/2022   NIFEdipine (ADALAT CC) 30 MG 24 hr tablet Take 2 tablets (60 mg total) by mouth daily.   09/11/2022   Prenatal Vit-Fe Fumarate-FA (PRENATAL MULTIVITAMIN) TABS tablet Take 1 tablet by mouth daily at 12 noon.   09/11/2022   cyclobenzaprine (FLEXERIL) 5 MG tablet Take 1 tablet (5 mg total) by mouth 3 (three) times daily as needed for muscle spasms. 30 tablet 0    metoCLOPramide (REGLAN) 10 MG tablet Take 1 tablet (10 mg total) by mouth every 6 (six) hours as needed for nausea. 30 tablet 1    ondansetron (ZOFRAN-ODT) 4 MG disintegrating tablet Take 1 tablet (4 mg total)  by mouth every 6 (six) hours as needed for nausea. 20 tablet 0     I have reviewed patient's Past Medical Hx, Surgical Hx, Family Hx, Social Hx, medications and allergies.   ROS:  Review of Systems Other systems negative  Physical Exam  Patient Vitals for the past 24 hrs:  BP Temp Pulse Resp Height Weight  09/12/22 0022 (!) 143/82 -- 75 -- -- --  09/11/22 2312 (!) 146/81 -- 73 -- -- --  09/11/22 2039 (!) 150/82 98 F (36.7 C) 74 18 5' 1.5" (1.562 m) 87.5 kg   Constitutional: Well-developed, well-nourished female in no acute distress.  Cardiovascular: normal rate and rhythm Respiratory: normal effort, clear to auscultation bilaterally GI: Abd soft, non-tender, gravid appropriate for gestational age.   No rebound or guarding. MS: Extremities nontender, no edema, normal ROM Neurologic: Alert and oriented x 4.  GU: Neg CVAT.  FHT: 152 Contractions: none   Labs: Results for orders placed  or performed during the hospital encounter of 09/11/22 (from the past 24 hour(s))  Urinalysis, Routine w reflex microscopic -Urine, Clean Catch     Status: Abnormal   Collection Time: 09/11/22  8:41 PM  Result Value Ref Range   Color, Urine YELLOW YELLOW   APPearance HAZY (A) CLEAR   Specific Gravity, Urine 1.020 1.005 - 1.030   pH 5.0 5.0 - 8.0   Glucose, UA NEGATIVE NEGATIVE mg/dL   Hgb urine dipstick NEGATIVE NEGATIVE   Bilirubin Urine NEGATIVE NEGATIVE   Ketones, ur 20 (A) NEGATIVE mg/dL   Protein, ur NEGATIVE NEGATIVE mg/dL   Nitrite NEGATIVE NEGATIVE   Leukocytes,Ua NEGATIVE NEGATIVE   RBC / HPF 0-5 0 - 5 RBC/hpf   WBC, UA 0-5 0 - 5 WBC/hpf   Bacteria, UA NONE SEEN NONE SEEN   Squamous Epithelial / HPF 6-10 0 - 5 /HPF   Mucus PRESENT    --/--/B POS (04/17 1631)  Imaging:  No results found.  MAU Course/MDM: I have reviewed the triage vital signs and the nursing notes.   Pertinent labs & imaging results that were available during my care of the patient were reviewed by me and considered in my medical decision making (see chart for details).      I have reviewed her medical records including past results, notes and treatments.   I have ordered labs and reviewed results.  NST reviewed Treatments in MAU included evening Procardia dose missed at home, FHR.    Assessment: 1. Uterine contractions at greater than 20 weeks of gestation   2. [redacted] weeks gestation of pregnancy   3. Chronic hypertension affecting pregnancy   Suspect pain is due to round ligament pain from physical exertion and heavy lifting. Now resolved. No suspicion for preterm labor.   Suspected elevated pressure is due to medication noncompliance.   Plan: Discharge home Continue your Procardia as prescribed Labor precautions and fetal kick counts Follow up in Office for prenatal visits and recheck  Follow-up Information     [facility] Follow up.   Why: Continue scheduled prenatal  care Sooner as needed for any concerns Contact information: [address]  [address] [place] [address] [phone]               Pt stable at time of discharge.  Wyn Forster, MD FMOB Fellow, Faculty practice [facility], Center for [facility]  09/12/2022 12:24 AM

## 2022-09-11 NOTE — MAU Note (Signed)
.  Katrina Garcia is a 37 y.o. at 101w0d here in MAU reporting: lower abd and back pain  is constant that started an hour ago. Reports good fetal movement denies ay vag bleeding or discharge.  LMP:  Onset of complaint: 1 hour Pain score: 8  Vitals:   09/11/22 2039  BP: (!) 150/82  Pulse: 74  Resp: 18  Temp: 98 F (36.7 C)     FHT:152 Lab orders placed from triage:

## 2022-09-15 DIAGNOSIS — Z3480 Encounter for supervision of other normal pregnancy, unspecified trimester: Secondary | ICD-10-CM | POA: Diagnosis not present

## 2022-09-15 DIAGNOSIS — Z23 Encounter for immunization: Secondary | ICD-10-CM | POA: Diagnosis not present

## 2022-10-02 DIAGNOSIS — Z03818 Encounter for observation for suspected exposure to other biological agents ruled out: Secondary | ICD-10-CM | POA: Diagnosis not present

## 2022-10-02 DIAGNOSIS — R051 Acute cough: Secondary | ICD-10-CM | POA: Diagnosis not present

## 2022-10-02 DIAGNOSIS — O10919 Unspecified pre-existing hypertension complicating pregnancy, unspecified trimester: Secondary | ICD-10-CM | POA: Diagnosis not present

## 2022-10-02 DIAGNOSIS — J069 Acute upper respiratory infection, unspecified: Secondary | ICD-10-CM | POA: Diagnosis not present

## 2022-10-08 DIAGNOSIS — Z23 Encounter for immunization: Secondary | ICD-10-CM | POA: Diagnosis not present

## 2022-10-08 DIAGNOSIS — O10013 Pre-existing essential hypertension complicating pregnancy, third trimester: Secondary | ICD-10-CM | POA: Diagnosis not present

## 2022-10-08 DIAGNOSIS — O09513 Supervision of elderly primigravida, third trimester: Secondary | ICD-10-CM | POA: Diagnosis not present

## 2022-10-08 DIAGNOSIS — Z3A28 28 weeks gestation of pregnancy: Secondary | ICD-10-CM | POA: Diagnosis not present

## 2022-10-12 ENCOUNTER — Encounter (HOSPITAL_COMMUNITY): Payer: Self-pay | Admitting: Obstetrics and Gynecology

## 2022-10-12 ENCOUNTER — Inpatient Hospital Stay (HOSPITAL_COMMUNITY)
Admission: AD | Admit: 2022-10-12 | Discharge: 2022-10-13 | Disposition: A | Discharge status: Home / Self Care | Payer: Federal, State, Local not specified - PPO | Attending: Obstetrics and Gynecology | Admitting: Obstetrics and Gynecology

## 2022-10-12 DIAGNOSIS — Z3A29 29 weeks gestation of pregnancy: Secondary | ICD-10-CM

## 2022-10-12 DIAGNOSIS — O09213 Supervision of pregnancy with history of pre-term labor, third trimester: Secondary | ICD-10-CM | POA: Insufficient documentation

## 2022-10-12 DIAGNOSIS — O10913 Unspecified pre-existing hypertension complicating pregnancy, third trimester: Secondary | ICD-10-CM | POA: Insufficient documentation

## 2022-10-12 DIAGNOSIS — N898 Other specified noninflammatory disorders of vagina: Secondary | ICD-10-CM | POA: Diagnosis not present

## 2022-10-12 DIAGNOSIS — Z3493 Encounter for supervision of normal pregnancy, unspecified, third trimester: Secondary | ICD-10-CM

## 2022-10-12 DIAGNOSIS — O479 False labor, unspecified: Secondary | ICD-10-CM

## 2022-10-12 DIAGNOSIS — O26893 Other specified pregnancy related conditions, third trimester: Secondary | ICD-10-CM | POA: Diagnosis not present

## 2022-10-12 LAB — URINALYSIS, ROUTINE W REFLEX MICROSCOPIC
Bilirubin Urine: NEGATIVE
Glucose, UA: NEGATIVE mg/dL
Hgb urine dipstick: NEGATIVE
Ketones, ur: NEGATIVE mg/dL
Leukocytes,Ua: NEGATIVE
Nitrite: NEGATIVE
Protein, ur: NEGATIVE mg/dL
Specific Gravity, Urine: 1.013 (ref 1.005–1.030)
pH: 6 (ref 5.0–8.0)

## 2022-10-12 LAB — WET PREP, GENITAL
Clue Cells Wet Prep HPF POC: NONE SEEN
Sperm: NONE SEEN
Trich, Wet Prep: NONE SEEN
WBC, Wet Prep HPF POC: <10 (ref <10)
Yeast Wet Prep HPF POC: NONE SEEN

## 2022-10-12 LAB — POCT FERN TEST: POCT Fern Test: NEGATIVE

## 2022-10-12 NOTE — MAU Note (Signed)
2358- Pt discharged by MAU provider, discharge instructions reviewed by provider. Patient off MAU floor prior to signing AVS.

## 2022-10-12 NOTE — MAU Provider Note (Incomplete)
  History     CSN: [id number]  Arrival date and time: 10/12/22 2103   None     Chief Complaint  Patient presents with   Contractions   Rupture of Membranes   HPI  {GYN/OB [id number]}  Past Medical History:  Diagnosis Date   Abnormal Pap smear of cervix    ascus hpv hr+   Fibroid uterus 07/29/2013   Left subserosal 3.5 x 3.1 x 2.5 Left Intramural 1.2 x 1.3 x 1.4     HPV (human papilloma virus) infection    Hypertension    MDD (major depressive disorder)    Migraines    PTSD (post-traumatic stress disorder)     Past Surgical History:  Procedure Laterality Date   CESAREAN SECTION N/A 12/18/2013   Procedure: CESAREAN SECTION;  Surgeon: Levie Heritage, DO;  Location: [facility];  Service: Obstetrics;  Laterality: N/A;   WISDOM TOOTH EXTRACTION      Family History  Problem Relation Age of Onset   Hypertension Father    Cancer Paternal Grandmother        breast cancer   Hypertension Paternal Grandmother    Breast cancer Paternal Grandmother     Social History   Tobacco Use   Smoking status: Former    Current packs/day: 0.00    Types: Cigarettes    Quit date: 01/29/2017    Years since quitting: 5.7   Smokeless tobacco: Never  Vaping Use   Vaping status: Never Used  Substance Use Topics   Alcohol use: Not Currently   Drug use: Not Currently    Allergies: No Known Allergies  Medications Prior to Admission  Medication Sig Dispense Refill Last Dose   aspirin 81 MG chewable tablet Chew 81 mg by mouth daily.   10/12/2022   NIFEdipine (ADALAT CC) 30 MG 24 hr tablet Take 2 tablets (60 mg total) by mouth daily.   10/12/2022 at 1900   Prenatal Vit-Fe Fumarate-FA (PRENATAL MULTIVITAMIN) TABS tablet Take 1 tablet by mouth daily at 12 noon.   10/12/2022   cyclobenzaprine (FLEXERIL) 5 MG tablet Take 1 tablet (5 mg total) by mouth 3 (three) times daily as needed for muscle spasms. 30 tablet 0    metoCLOPramide (REGLAN) 10 MG tablet Take 1 tablet (10 mg total) by mouth every 6  (six) hours as needed for nausea. 30 tablet 1    ondansetron (ZOFRAN-ODT) 4 MG disintegrating tablet Take 1 tablet (4 mg total) by mouth every 6 (six) hours as needed for nausea. 20 tablet 0     Review of Systems Physical Exam   Blood pressure 137/81, pulse 75, temperature 97.9 F (36.6 C), temperature source Oral, resp. rate 17, height 5\' 1"  (1.549 m), weight 91 kg, last menstrual period 03/20/2022, SpO2 97%.  Physical Exam  MAU Course  Procedures  MDM ***  Assessment and Plan  ***  Claudette Head 10/12/2022, 9:56 PM

## 2022-10-12 NOTE — MAU Provider Note (Signed)
History  Katrina Garcia , a  37 y.o. [phone] at [redacted]w[redacted]d presents to MAU for ctx's she reports increased after she felt a gush of fluid x2 while in the shower. She reports the ctx's are intermittent but a 9/10 when they occur and a cramping feeling in her lower abdomen and lower back. She denies VB and reports good FM. She denies taking any medication for the pain but states rest and heat helps and the cramping worsens with activity. She denies any other abnormal vaginal discharge, irritation, fever, chills or dysuria. She states that she typically drinks 4-6 bottles of water per day but today was only able to drink four. Patient has cHTN on Procardia. She denies HA, vision changes or epigastric pain.  CSN: [id number]  Arrival date and time: 10/12/22 2103   None     Chief Complaint  Patient presents with   Contractions   Rupture of Membranes   HPI  OB History     Gravida  3   Para  1   Term      Preterm  1   AB  1   Living  1      SAB      IAB      Ectopic      Multiple  0   Live Births  1           Past Medical History:  Diagnosis Date   Abnormal Pap smear of cervix    ascus hpv hr+   Fibroid uterus 07/29/2013   Left subserosal 3.5 x 3.1 x 2.5 Left Intramural 1.2 x 1.3 x 1.4     HPV (human papilloma virus) infection    Hypertension    MDD (major depressive disorder)    Migraines    PTSD (post-traumatic stress disorder)     Past Surgical History:  Procedure Laterality Date   CESAREAN SECTION N/A 12/18/2013   Procedure: CESAREAN SECTION;  Surgeon: Levie Heritage, DO;  Location: [facility];  Service: Obstetrics;  Laterality: N/A;   WISDOM TOOTH EXTRACTION      Family History  Problem Relation Age of Onset   Hypertension Father    Cancer Paternal Grandmother        breast cancer   Hypertension Paternal Grandmother    Breast cancer Paternal Grandmother     Social History   Tobacco Use   Smoking status: Former    Current packs/day: 0.00    Types:  Cigarettes    Quit date: 01/29/2017    Years since quitting: 5.7   Smokeless tobacco: Never  Vaping Use   Vaping status: Never Used  Substance Use Topics   Alcohol use: Not Currently   Drug use: Not Currently    Allergies: No Known Allergies  Medications Prior to Admission  Medication Sig Dispense Refill Last Dose   aspirin 81 MG chewable tablet Chew 81 mg by mouth daily.   10/12/2022   NIFEdipine (ADALAT CC) 30 MG 24 hr tablet Take 2 tablets (60 mg total) by mouth daily.   10/12/2022 at 1900   Prenatal Vit-Fe Fumarate-FA (PRENATAL MULTIVITAMIN) TABS tablet Take 1 tablet by mouth daily at 12 noon.   10/12/2022   cyclobenzaprine (FLEXERIL) 5 MG tablet Take 1 tablet (5 mg total) by mouth 3 (three) times daily as needed for muscle spasms. 30 tablet 0    metoCLOPramide (REGLAN) 10 MG tablet Take 1 tablet (10 mg total) by mouth every 6 (six) hours as needed for nausea. 30 tablet  1    ondansetron (ZOFRAN-ODT) 4 MG disintegrating tablet Take 1 tablet (4 mg total) by mouth every 6 (six) hours as needed for nausea. 20 tablet 0     Review of Systems  Constitutional:  Negative for chills and fever.  HENT:  Positive for congestion.   Cardiovascular:  Negative for chest pain.  Gastrointestinal:  Positive for abdominal pain.  Genitourinary:  Negative for dysuria, flank pain, hematuria and vaginal discharge.  Musculoskeletal:  Positive for back pain.  Neurological:  Negative for headaches.   Physical Exam   Blood pressure 137/81, pulse 75, temperature 97.9 F (36.6 C), temperature source Oral, resp. rate 17, height 5\' 1"  (1.549 m), weight 91 kg, last menstrual period 03/20/2022, SpO2 97%.  Physical Exam Constitutional:      General: She is not in acute distress.    Appearance: She is not ill-appearing.  Pulmonary:     Effort: Pulmonary effort is normal.  Genitourinary:    General: Normal vulva.     Vagina: Vaginal discharge present.     Cervix: Normal. No friability, lesion, erythema or  cervical bleeding.  Skin:    General: Skin is warm and dry.  Neurological:     Mental Status: She is oriented to person, place, and time.  Psychiatric:        Mood and Affect: Mood normal.     MAU Course  Procedures  MDM History of preterm delivery: Abdominal cramping & vaginal discharge on exam: -UA -GC/CT -Fern -Wetprep Orders Placed This Encounter  Procedures   Wet prep, genital   Urinalysis, Routine w reflex microscopic -Urine, Clean Catch   Fern Test   Discharge patient    Results for orders placed or performed during the hospital encounter of 10/12/22 (from the past 24 hour(s))  Urinalysis, Routine w reflex microscopic -Urine, Clean Catch     Status: Abnormal   Collection Time: 10/12/22  9:11 PM  Result Value Ref Range   Color, Urine YELLOW YELLOW   APPearance HAZY (A) CLEAR   Specific Gravity, Urine 1.013 1.005 - 1.030   pH 6.0 5.0 - 8.0   Glucose, UA NEGATIVE NEGATIVE mg/dL   Hgb urine dipstick NEGATIVE NEGATIVE   Bilirubin Urine NEGATIVE NEGATIVE   Ketones, ur NEGATIVE NEGATIVE mg/dL   Protein, ur NEGATIVE NEGATIVE mg/dL   Nitrite NEGATIVE NEGATIVE   Leukocytes,Ua NEGATIVE NEGATIVE  Wet prep, genital     Status: None   Collection Time: 10/12/22 10:38 PM  Result Value Ref Range   Yeast Wet Prep HPF POC NONE SEEN NONE SEEN   Trich, Wet Prep NONE SEEN NONE SEEN   Clue Cells Wet Prep HPF POC NONE SEEN NONE SEEN   WBC, Wet Prep HPF POC <10 <10   Sperm NONE SEEN     Assessment and Plan  1.Uterine irritability 2.Hx of Preterm Delivery at 35.5w -NST reactive -Urinalysis: WNL -Fern test: negative -Wetprep: WNL Encouraged patient to increase daily fluid intake.  3. Chronic Hypertension -On Procardia daily -Denies HA, visual disturbances, epigastric pain  Discharged patient home. Expectant management reviewed. Educated on warning signs and when to return to triage. Advised patient to keep appointment as scheduled and take BP medication as prescribed.     Darrell Jewel 10/12/2022, 10:46 PM

## 2022-10-12 NOTE — MAU Note (Signed)
.  Katrina Garcia is a 37 y.o. at [redacted]w[redacted]d here in MAU reporting:  Pt has felt ctxs all day and got in the shower and felt a gush of fluid come out once she got out the shower at 830pm. Pt states she has not had a gush of fluid come out like that before. Pt states her underwear is currently wet. No bleeding  +Fm   Ctxs 9/10 lower abdominal pain. Has gotten worse since the gush of fluid.   Does have CHTN.  FHR 130   Lab orders placed from triage:  UA.   Vitals:   10/12/22 2141 10/12/22 2151  BP:  137/81  Pulse:  75  Resp:  17  Temp: 97.9 F (36.6 C)   SpO2:  97%

## 2022-10-13 LAB — GC/CHLAMYDIA PROBE AMP (~~LOC~~) NOT AT ARMC
Chlamydia: NEGATIVE
Comment: NEGATIVE
Comment: NORMAL
Neisseria Gonorrhea: NEGATIVE

## 2022-10-21 DIAGNOSIS — Z369 Encounter for antenatal screening, unspecified: Secondary | ICD-10-CM | POA: Diagnosis not present

## 2022-10-27 DIAGNOSIS — F4323 Adjustment disorder with mixed anxiety and depressed mood: Secondary | ICD-10-CM | POA: Diagnosis not present

## 2022-11-02 ENCOUNTER — Inpatient Hospital Stay (HOSPITAL_COMMUNITY)
Admission: AD | Admit: 2022-11-02 | Discharge: 2022-11-03 | Disposition: A | Discharge status: Home / Self Care | Payer: Federal, State, Local not specified - PPO | Attending: Obstetrics and Gynecology | Admitting: Obstetrics and Gynecology

## 2022-11-02 ENCOUNTER — Encounter (HOSPITAL_COMMUNITY): Payer: Self-pay | Admitting: Obstetrics and Gynecology

## 2022-11-02 DIAGNOSIS — G4486 Cervicogenic headache: Secondary | ICD-10-CM

## 2022-11-02 DIAGNOSIS — O99353 Diseases of the nervous system complicating pregnancy, third trimester: Secondary | ICD-10-CM | POA: Diagnosis not present

## 2022-11-02 DIAGNOSIS — O163 Unspecified maternal hypertension, third trimester: Secondary | ICD-10-CM | POA: Diagnosis not present

## 2022-11-02 DIAGNOSIS — O10013 Pre-existing essential hypertension complicating pregnancy, third trimester: Secondary | ICD-10-CM | POA: Diagnosis not present

## 2022-11-02 DIAGNOSIS — O09293 Supervision of pregnancy with other poor reproductive or obstetric history, third trimester: Secondary | ICD-10-CM | POA: Diagnosis not present

## 2022-11-02 DIAGNOSIS — O99891 Other specified diseases and conditions complicating pregnancy: Secondary | ICD-10-CM | POA: Diagnosis not present

## 2022-11-02 DIAGNOSIS — Z3A32 32 weeks gestation of pregnancy: Secondary | ICD-10-CM

## 2022-11-02 DIAGNOSIS — N750 Cyst of Bartholin's gland: Secondary | ICD-10-CM

## 2022-11-02 DIAGNOSIS — N858 Other specified noninflammatory disorders of uterus: Secondary | ICD-10-CM | POA: Insufficient documentation

## 2022-11-02 DIAGNOSIS — R030 Elevated blood-pressure reading, without diagnosis of hypertension: Secondary | ICD-10-CM | POA: Diagnosis not present

## 2022-11-02 DIAGNOSIS — O34219 Maternal care for unspecified type scar from previous cesarean delivery: Secondary | ICD-10-CM | POA: Insufficient documentation

## 2022-11-02 DIAGNOSIS — R519 Headache, unspecified: Secondary | ICD-10-CM | POA: Diagnosis not present

## 2022-11-02 LAB — CBC
HCT: 29.1 % — ABNORMAL LOW (ref 36.0–46.0)
Hemoglobin: 9.5 g/dL — ABNORMAL LOW (ref 12.0–15.0)
MCH: 26.5 pg (ref 26.0–34.0)
MCHC: 32.6 g/dL (ref 30.0–36.0)
MCV: 81.1 fL (ref 80.0–100.0)
Platelets: 278 10*3/uL (ref 150–400)
RBC: 3.59 MIL/uL — ABNORMAL LOW (ref 3.87–5.11)
RDW: 13.2 % (ref 11.5–15.5)
WBC: 12.1 10*3/uL — ABNORMAL HIGH (ref 4.0–10.5)
nRBC: 0 % (ref 0.0–0.2)

## 2022-11-02 LAB — PROTEIN / CREATININE RATIO, URINE
Creatinine, Urine: 320 mg/dL
Protein Creatinine Ratio: 0.05 mg/mg{creat} (ref 0.00–0.15)
Total Protein, Urine: 15 mg/dL

## 2022-11-02 MED ORDER — CYCLOBENZAPRINE HCL 5 MG PO TABS
5.0000 mg | ORAL_TABLET | Freq: Three times a day (TID) | ORAL | Status: DC | PRN
  Administered 2022-11-02: 5 mg via ORAL
  Filled 2022-11-02: qty 1

## 2022-11-02 MED ORDER — CYCLOBENZAPRINE HCL 5 MG PO TABS: 10.0000 mg | ORAL_TABLET | Freq: Three times a day (TID) | ORAL | Status: DC | PRN

## 2022-11-02 MED ORDER — ONDANSETRON 4 MG PO TBDP: 4.0000 mg | ORAL_TABLET | Freq: Once | ORAL | Status: DC

## 2022-11-02 NOTE — MAU Note (Signed)
Pt says BP at 903pm -152/89                       910pm- 147/95                        915pm- 151/85 H/A 3/10 - since 11am-     - Took XS Tyl 2 tabs - at 3pm- 5/10  Now - H/A 10/10 No vision changes. No epigastric pain. Feels weak and nauseated  Baby moving

## 2022-11-02 NOTE — MAU Provider Note (Incomplete)
Chief Complaint:  Headache  HPI  HPI: Katrina Garcia is a 37 y.o. [phone] at 34w3dwho presents to maternity admissions reporting elevated blood pressure at home 150s/90s, with associated nausea, posterior & cervical headache unrelieved with 1000mg  Tylenol at 1500 today, now 3-4/10 without nausea. Takes 30 mg Procardia BID at 0900 and 2100, she did take it as scheduled.   Denies vision changes, epigastric or abdominal pain, increased swelling of LE. Feels week and nauseous. No emesis.   She reports good fetal movement, denies LOF, vaginal bleeding, vaginal itching/burning, urinary symptoms, dizziness, diarrhea, constipation or fever/chills.    Patient also complains of a two day hx of left labial swelling, that has increased in size and is painful with walking. Hx of similar during prior pregnancy that resolved without intervention or I&D. Currently size of a green grape, but was not there 3 days ago at all. No drainage or rash that she can tell. Denies any household member with boils or staph infections.   Pregnancy c/b hx of CS for severe preeclampsia at [redacted]w[redacted]d.  Past Medical History: Past Medical History:  Diagnosis Date   Abnormal Pap smear of cervix    ascus hpv hr+   Fibroid uterus 07/29/2013   Left subserosal 3.5 x 3.1 x 2.5 Left Intramural 1.2 x 1.3 x 1.4     HPV (human papilloma virus) infection    Hypertension    MDD (major depressive disorder)    Migraines    PTSD (post-traumatic stress disorder)     Past obstetric history: OB History  Gravida Para Term Preterm AB Living  3 1   1 1 1   SAB IAB Ectopic Multiple Live Births        0 1    # Outcome Date GA Lbr Len/2nd Weight Sex Type Anes PTL Lv  3 Current           2 Preterm 12/18/13 [redacted]w[redacted]d  2631 g M CS-LTranv EPI  LIV  1 AB             Past Surgical History: Past Surgical History:  Procedure Laterality Date   CESAREAN SECTION N/A 12/18/2013   Procedure: CESAREAN SECTION;  Surgeon: Levie Heritage, DO;  Location: [facility];   Service: Obstetrics;  Laterality: N/A;   WISDOM TOOTH EXTRACTION      Family History: Family History  Problem Relation Age of Onset   Hypertension Father    Cancer Paternal Grandmother        breast cancer   Hypertension Paternal Grandmother    Breast cancer Paternal Grandmother     Social History: Social History   Tobacco Use   Smoking status: Former    Current packs/day: 0.00    Types: Cigarettes    Quit date: 01/29/2017    Years since quitting: 5.7   Smokeless tobacco: Never  Vaping Use   Vaping status: Never Used  Substance Use Topics   Alcohol use: Not Currently   Drug use: Not Currently    Allergies: No Known Allergies  Meds:  Medications Prior to Admission  Medication Sig Dispense Refill Last Dose   aspirin 81 MG chewable tablet Chew 81 mg by mouth daily.   11/02/2022   NIFEdipine (ADALAT CC) 30 MG 24 hr tablet Take 2 tablets (60 mg total) by mouth daily.   11/02/2022 at 2100   cyclobenzaprine (FLEXERIL) 5 MG tablet Take 1 tablet (5 mg total) by mouth 3 (three) times daily as needed for muscle spasms. 30 tablet 0  metoCLOPramide (REGLAN) 10 MG tablet Take 1 tablet (10 mg total) by mouth every 6 (six) hours as needed for nausea. 30 tablet 1    ondansetron (ZOFRAN-ODT) 4 MG disintegrating tablet Take 1 tablet (4 mg total) by mouth every 6 (six) hours as needed for nausea. 20 tablet 0    Prenatal Vit-Fe Fumarate-FA (PRENATAL MULTIVITAMIN) TABS tablet Take 1 tablet by mouth daily at 12 noon.       I have reviewed patient's Past Medical Hx, Surgical Hx, Family Hx, Social Hx, medications and allergies.   ROS:  Review of Systems Other systems negative  Physical Exam  Patient Vitals for the past 24 hrs:  BP Temp Temp src Pulse Resp SpO2 Height Weight  11/03/22 0205 -- -- -- -- -- 96 % -- --  11/03/22 0201 136/79 -- -- 72 -- -- -- --  11/03/22 0200 -- -- -- -- -- 98 % -- --  11/03/22 0131 134/79 -- -- 64 -- -- -- --  11/03/22 0130 -- -- -- -- -- 97 % -- --   11/03/22 0116 111/67 -- -- 66 -- -- -- --  11/03/22 0100 (!) 144/89 -- -- 78 -- 97 % -- --  11/03/22 0046 (!) 143/72 -- -- 68 -- -- -- --  11/03/22 0035 -- -- -- -- -- 97 % -- --  11/03/22 0031 131/77 -- -- 69 -- -- -- --  11/03/22 0020 -- -- -- -- -- 97 % -- --  11/03/22 0016 (!) 124/55 -- -- 67 -- -- -- --  11/03/22 0001 126/60 -- -- 67 -- -- -- --  11/02/22 2348 (!) 143/76 -- -- 78 -- -- -- --  11/02/22 2347 (!) 143/76 -- -- 78 -- -- -- --  11/02/22 2330 (!) 147/86 -- -- 76 -- 98 % -- --  11/02/22 2316 (!) 146/89 -- -- 89 -- -- -- --  11/02/22 2248 (!) 151/93 -- -- 86 -- 98 % -- --  11/02/22 2231 (!) 148/92 -- -- 97 -- -- -- --  11/02/22 2210 (!) 146/95 97.9 F (36.6 C) Oral 97 16 -- 5\' 1"  (1.549 m) 92.1 kg   Constitutional: Well-developed, well-nourished female in no acute distress.  Cardiovascular: normal rate and rhythm Respiratory: normal effort, clear to auscultation bilaterally GI: Abd soft, non-tender, gravid appropriate for gestational age.   No rebound or guarding. MS: Extremities nontender, no edema, normal ROM Neurologic: Alert and oriented x 4.  GU: Neg CVAT.  Normal female genitalia. Right labial bartholin cyst 3cm. Deep, tender.   FHT:  Baseline 160, moderate variability, accelerations present, no decelerations Contractions: Rare   Labs: Results for orders placed or performed during the hospital encounter of 11/02/22 (from the past 24 hour(s))  Protein / creatinine ratio, urine     Status: None   Collection Time: 11/02/22 11:13 PM  Result Value Ref Range   Creatinine, Urine 320 mg/dL   Total Protein, Urine 15 mg/dL   Protein Creatinine Ratio 0.05 0.00 - 0.15 mg/mg[Cre]  CBC     Status: Abnormal   Collection Time: 11/02/22 11:17 PM  Result Value Ref Range   WBC 12.1 (H) 4.0 - 10.5 K/uL   RBC 3.59 (L) 3.87 - 5.11 MIL/uL   Hemoglobin 9.5 (L) 12.0 - 15.0 g/dL   HCT 40.9 (L) 81.1 - 91.4 %   MCV 81.1 80.0 - 100.0 fL   MCH 26.5 26.0 - 34.0 pg   MCHC 32.6  30.0 - 36.0 g/dL  RDW 13.2 11.5 - 15.5 %   Platelets 278 150 - 400 K/uL   nRBC 0.0 0.0 - 0.2 %  Comprehensive metabolic panel     Status: Abnormal   Collection Time: 11/02/22 11:17 PM  Result Value Ref Range   Sodium 136 135 - 145 mmol/L   Potassium 3.7 3.5 - 5.1 mmol/L   Chloride 106 98 - 111 mmol/L   CO2 24 22 - 32 mmol/L   Glucose, Bld 123 (H) 70 - 99 mg/dL   BUN 8 6 - 20 mg/dL   Creatinine, Ser 1.61 0.44 - 1.00 mg/dL   Calcium 8.5 (L) 8.9 - 10.3 mg/dL   Total Protein 5.5 (L) 6.5 - 8.1 g/dL   Albumin 2.6 (L) 3.5 - 5.0 g/dL   AST 20 15 - 41 U/L   ALT 13 0 - 44 U/L   Alkaline Phosphatase 85 38 - 126 U/L   Total Bilirubin 0.1 (L) 0.3 - 1.2 mg/dL   GFR, Estimated >09 >60 mL/min   Anion gap 6 5 - 15   --/--/B POS (04/17 1631)  Imaging:  No results found.  MAU Course/MDM: I have reviewed the triage vital signs and the nursing notes.   Pertinent labs & imaging results that were available during my care of the patient were reviewed by me and considered in my medical decision making (see chart for details).      I have reviewed her medical records including past results, notes and treatments.   I have ordered labs and reviewed results.  NST reviewed Discussed presentation, exam findings and test results with Dr. Mindi Slicker - she come assess that patient and make a treatment plan.   Treatments in MAU included NST, CBC, CMP, Urine Pro/Cr, Flexiril  Assessment: 1. [redacted] weeks gestation of pregnancy   2. Hypertension affecting pregnancy in third trimester   3. Bartholin cyst   4. Cervicogenic headache   Hypertenstion - mild range blood pressures, improved with medication.  Cervicogenic headache - improved with 5mg  Flexiril, patient sleeping soundly on reassessment.  Cyst - deep, do not feel that I&D would be optimal at this time. Discussed role for warm compresses, and return precautions if enlarging continues or fever/chills develops. Review with OB at scheduled appointment tomorrow.    Plan: Dr. Mindi Slicker to reassess and make further treatment plan.  Wyn Forster, MD FMOB Fellow, Faculty practice [facility], Center for [facility]  11/03/2022 2:53 AM

## 2022-11-03 DIAGNOSIS — Z3A32 32 weeks gestation of pregnancy: Secondary | ICD-10-CM | POA: Diagnosis not present

## 2022-11-03 DIAGNOSIS — F4323 Adjustment disorder with mixed anxiety and depressed mood: Secondary | ICD-10-CM | POA: Diagnosis not present

## 2022-11-03 DIAGNOSIS — O169 Unspecified maternal hypertension, unspecified trimester: Secondary | ICD-10-CM | POA: Diagnosis not present

## 2022-11-03 LAB — COMPREHENSIVE METABOLIC PANEL
ALT: 13 U/L (ref 0–44)
AST: 20 U/L (ref 15–41)
Albumin: 2.6 g/dL — ABNORMAL LOW (ref 3.5–5.0)
Alkaline Phosphatase: 85 U/L (ref 38–126)
Anion gap: 6 (ref 5–15)
BUN: 8 mg/dL (ref 6–20)
CO2: 24 mmol/L (ref 22–32)
Calcium: 8.5 mg/dL — ABNORMAL LOW (ref 8.9–10.3)
Chloride: 106 mmol/L (ref 98–111)
Creatinine, Ser: 0.63 mg/dL (ref 0.44–1.00)
GFR, Estimated: >60 mL/min (ref >60)
Glucose, Bld: 123 mg/dL — ABNORMAL HIGH (ref 70–99)
Potassium: 3.7 mmol/L (ref 3.5–5.1)
Sodium: 136 mmol/L (ref 135–145)
Total Bilirubin: 0.1 mg/dL — ABNORMAL LOW (ref 0.3–1.2)
Total Protein: 5.5 g/dL — ABNORMAL LOW (ref 6.5–8.1)

## 2022-11-03 MED ORDER — NIFEDIPINE ER OSMOTIC RELEASE 30 MG PO TB24
30.0000 mg | ORAL_TABLET | Freq: Once | ORAL | Status: AC
  Administered 2022-11-03: 30 mg via ORAL
  Filled 2022-11-03: qty 1

## 2022-11-06 DIAGNOSIS — O133 Gestational [pregnancy-induced] hypertension without significant proteinuria, third trimester: Secondary | ICD-10-CM | POA: Diagnosis not present

## 2022-11-11 DIAGNOSIS — F4323 Adjustment disorder with mixed anxiety and depressed mood: Secondary | ICD-10-CM | POA: Diagnosis not present

## 2022-11-11 DIAGNOSIS — Z3A33 33 weeks gestation of pregnancy: Secondary | ICD-10-CM | POA: Diagnosis not present

## 2022-11-11 DIAGNOSIS — O10013 Pre-existing essential hypertension complicating pregnancy, third trimester: Secondary | ICD-10-CM | POA: Diagnosis not present

## 2022-11-17 DIAGNOSIS — Z3483 Encounter for supervision of other normal pregnancy, third trimester: Secondary | ICD-10-CM | POA: Diagnosis not present

## 2022-11-17 DIAGNOSIS — F4323 Adjustment disorder with mixed anxiety and depressed mood: Secondary | ICD-10-CM | POA: Diagnosis not present

## 2022-11-17 DIAGNOSIS — I10 Essential (primary) hypertension: Secondary | ICD-10-CM | POA: Diagnosis not present

## 2022-11-24 DIAGNOSIS — F4323 Adjustment disorder with mixed anxiety and depressed mood: Secondary | ICD-10-CM | POA: Diagnosis not present

## 2022-11-25 DIAGNOSIS — O163 Unspecified maternal hypertension, third trimester: Secondary | ICD-10-CM | POA: Diagnosis not present

## 2022-11-25 DIAGNOSIS — Z3A35 35 weeks gestation of pregnancy: Secondary | ICD-10-CM | POA: Diagnosis not present

## 2022-11-30 DIAGNOSIS — O10019 Pre-existing essential hypertension complicating pregnancy, unspecified trimester: Secondary | ICD-10-CM | POA: Diagnosis not present

## 2022-11-30 DIAGNOSIS — O10013 Pre-existing essential hypertension complicating pregnancy, third trimester: Secondary | ICD-10-CM | POA: Diagnosis not present

## 2022-11-30 DIAGNOSIS — Z3480 Encounter for supervision of other normal pregnancy, unspecified trimester: Secondary | ICD-10-CM | POA: Diagnosis not present

## 2022-11-30 DIAGNOSIS — Z3A36 36 weeks gestation of pregnancy: Secondary | ICD-10-CM | POA: Diagnosis not present

## 2022-12-01 NOTE — Patient Instructions (Signed)
Katrina Garcia  12/01/2022   Your procedure is scheduled on:  12/14/2022  Arrive at 1000 at Entrance C on [address] at [facility]  and [employer]. You are invited to use the FREE valet parking or use the Visitor's parking deck.  Pick up the phone at the desk and dial [phone].  Call this number if you have problems the morning of surgery: [phone]  Remember:   Do not eat food:(After Midnight) Desps de medianoche.  You may drink clear liquids until arrival at ___1000__.  Clear liquids means a liquid you can see thru.  It can have color such as Cola or Kool aid.  Tea is OK and coffee as long as no milk or creamer of any kind.  Take these medicines the morning of surgery with A SIP OF WATER:  Take nifedipine as prescribed   Do not wear jewelry, make-up or nail polish.  Do not wear lotions, powders, or perfumes. Do not wear deodorant.  Do not shave 48 hours prior to surgery.  Do not bring valuables to the hospital.  [facility] is not   responsible for any belongings or valuables brought to the hospital.  Contacts, dentures or bridgework may not be worn into surgery.  Leave suitcase in the car. After surgery it may be brought to your room.  For patients admitted to the hospital, checkout time is 11:00 AM the day of              discharge.      Please read over the following fact sheets that you were given:     Preparing for Surgery

## 2022-12-02 ENCOUNTER — Encounter (HOSPITAL_COMMUNITY): Payer: Self-pay | Admitting: Obstetrics and Gynecology

## 2022-12-02 ENCOUNTER — Telehealth (HOSPITAL_COMMUNITY): Payer: Self-pay | Admitting: *Deleted

## 2022-12-02 ENCOUNTER — Inpatient Hospital Stay (HOSPITAL_COMMUNITY)
Admission: AD | Admit: 2022-12-02 | Discharge: 2022-12-02 | Disposition: A | Discharge status: Home / Self Care | Payer: Federal, State, Local not specified - PPO | Attending: Obstetrics and Gynecology | Admitting: Obstetrics and Gynecology

## 2022-12-02 ENCOUNTER — Encounter (HOSPITAL_COMMUNITY): Payer: Self-pay

## 2022-12-02 DIAGNOSIS — O09299 Supervision of pregnancy with other poor reproductive or obstetric history, unspecified trimester: Secondary | ICD-10-CM

## 2022-12-02 DIAGNOSIS — O26893 Other specified pregnancy related conditions, third trimester: Secondary | ICD-10-CM | POA: Insufficient documentation

## 2022-12-02 DIAGNOSIS — R519 Headache, unspecified: Secondary | ICD-10-CM | POA: Insufficient documentation

## 2022-12-02 DIAGNOSIS — Z3A36 36 weeks gestation of pregnancy: Secondary | ICD-10-CM | POA: Diagnosis not present

## 2022-12-02 DIAGNOSIS — O09293 Supervision of pregnancy with other poor reproductive or obstetric history, third trimester: Secondary | ICD-10-CM | POA: Insufficient documentation

## 2022-12-02 DIAGNOSIS — O10913 Unspecified pre-existing hypertension complicating pregnancy, third trimester: Secondary | ICD-10-CM | POA: Insufficient documentation

## 2022-12-02 DIAGNOSIS — O10919 Unspecified pre-existing hypertension complicating pregnancy, unspecified trimester: Secondary | ICD-10-CM

## 2022-12-02 LAB — CBC
HCT: 32.8 % — ABNORMAL LOW (ref 36.0–46.0)
Hemoglobin: 10.7 g/dL — ABNORMAL LOW (ref 12.0–15.0)
MCH: 25.8 pg — ABNORMAL LOW (ref 26.0–34.0)
MCHC: 32.6 g/dL (ref 30.0–36.0)
MCV: 79 fL — ABNORMAL LOW (ref 80.0–100.0)
Platelets: 303 10*3/uL (ref 150–400)
RBC: 4.15 MIL/uL (ref 3.87–5.11)
RDW: 13.2 % (ref 11.5–15.5)
WBC: 13.1 10*3/uL — ABNORMAL HIGH (ref 4.0–10.5)
nRBC: 0 % (ref 0.0–0.2)

## 2022-12-02 LAB — URINALYSIS, ROUTINE W REFLEX MICROSCOPIC
Bilirubin Urine: NEGATIVE
Glucose, UA: NEGATIVE mg/dL
Hgb urine dipstick: NEGATIVE
Ketones, ur: NEGATIVE mg/dL
Leukocytes,Ua: NEGATIVE
Nitrite: NEGATIVE
Protein, ur: NEGATIVE mg/dL
Specific Gravity, Urine: 1.024 (ref 1.005–1.030)
pH: 6 (ref 5.0–8.0)

## 2022-12-02 LAB — COMPREHENSIVE METABOLIC PANEL
ALT: 16 U/L (ref 0–44)
AST: 25 U/L (ref 15–41)
Albumin: 2.5 g/dL — ABNORMAL LOW (ref 3.5–5.0)
Alkaline Phosphatase: 108 U/L (ref 38–126)
Anion gap: 8 (ref 5–15)
BUN: 8 mg/dL (ref 6–20)
CO2: 22 mmol/L (ref 22–32)
Calcium: 8.8 mg/dL — ABNORMAL LOW (ref 8.9–10.3)
Chloride: 106 mmol/L (ref 98–111)
Creatinine, Ser: 0.67 mg/dL (ref 0.44–1.00)
GFR, Estimated: >60 mL/min (ref >60)
Glucose, Bld: 103 mg/dL — ABNORMAL HIGH (ref 70–99)
Potassium: 4 mmol/L (ref 3.5–5.1)
Sodium: 136 mmol/L (ref 135–145)
Total Bilirubin: 0.6 mg/dL (ref <1.2)
Total Protein: 5.9 g/dL — ABNORMAL LOW (ref 6.5–8.1)

## 2022-12-02 LAB — PROTEIN / CREATININE RATIO, URINE
Creatinine, Urine: 192 mg/dL
Protein Creatinine Ratio: 0.08 mg/mg{creat} (ref 0.00–0.15)
Total Protein, Urine: 15 mg/dL

## 2022-12-02 MED ORDER — NIFEDIPINE 10 MG PO CAPS: 20.0000 mg | ORAL_CAPSULE | ORAL | Status: DC | PRN

## 2022-12-02 MED ORDER — LABETALOL HCL 5 MG/ML IV SOLN: 40.0000 mg | INTRAVENOUS | Status: DC | PRN

## 2022-12-02 MED ORDER — ACETAMINOPHEN 500 MG PO TABS
1000.0000 mg | ORAL_TABLET | Freq: Once | ORAL | Status: AC
  Administered 2022-12-02: 1000 mg via ORAL
  Filled 2022-12-02: qty 2

## 2022-12-02 MED ORDER — LABETALOL HCL 5 MG/ML IV SOLN: 80.0000 mg | INTRAVENOUS | Status: DC | PRN

## 2022-12-02 MED ORDER — NIFEDIPINE ER 30 MG PO TB24: 90.0000 mg | ORAL_TABLET | Freq: Every day | ORAL | Status: DC

## 2022-12-02 MED ORDER — HYDRALAZINE HCL 20 MG/ML IJ SOLN: 10.0000 mg | INTRAMUSCULAR | Status: DC | PRN

## 2022-12-02 MED ORDER — NIFEDIPINE 10 MG PO CAPS: 10.0000 mg | ORAL_CAPSULE | ORAL | Status: DC | PRN

## 2022-12-02 MED ORDER — LABETALOL HCL 5 MG/ML IV SOLN: 20.0000 mg | INTRAVENOUS | Status: DC | PRN

## 2022-12-02 NOTE — MAU Provider Note (Signed)
Chief Complaint:  Hypertension  HPI  HPI: Katrina Garcia is a 37 y.o. [phone] at 34w5dwho presents to maternity admissions reporting 4/10 headache since 0730 with associated floaters in her vision and swollen feet/ankles. Hx of preeclampsia with prior pregnancy requiring delivery at [redacted]w[redacted]d weeks. Taking 90 mg Procardia daily; last dose at 0800 this morning of 60 mg. Home blood pressure has been elevated to day 160s-150s systolic.   She reports good fetal movement, denies LOF, vaginal bleeding, vaginal itching/burning, urinary symptoms, h/a, dizziness, n/v, diarrhea, constipation or fever/chills.  She denies headache, visual changes or RUQ abdominal pain.   Past Medical History: Past Medical History:  Diagnosis Date   Abnormal Pap smear of cervix    ascus hpv hr+   Fibroid uterus 07/29/2013   Left subserosal 3.5 x 3.1 x 2.5 Left Intramural 1.2 x 1.3 x 1.4     HPV (human papilloma virus) infection    Hypertension    MDD (major depressive disorder)    Migraines    PTSD (post-traumatic stress disorder)     Past obstetric history: OB History  Gravida Para Term Preterm AB Living  3 1   1 1 1   SAB IAB Ectopic Multiple Live Births        0 1    # Outcome Date GA Lbr Len/2nd Weight Sex Type Anes PTL Lv  3 Current           2 Preterm 12/18/13 [redacted]w[redacted]d  2631 g M CS-LTranv EPI  LIV  1 AB             Past Surgical History: Past Surgical History:  Procedure Laterality Date   CESAREAN SECTION N/A 12/18/2013   Procedure: CESAREAN SECTION;  Surgeon: Levie Heritage, DO;  Location: [facility];  Service: Obstetrics;  Laterality: N/A;   WISDOM TOOTH EXTRACTION      Family History: Family History  Problem Relation Age of Onset   Hypertension Father    Cancer Paternal Grandmother        breast cancer   Hypertension Paternal Grandmother    Breast cancer Paternal Grandmother     Social History: Social History   Tobacco Use   Smoking status: Former    Current packs/day: 0.00    Types:  Cigarettes    Quit date: 01/29/2017    Years since quitting: 5.8   Smokeless tobacco: Never  Vaping Use   Vaping status: Never Used  Substance Use Topics   Alcohol use: Not Currently   Drug use: Not Currently    Allergies: No Known Allergies  Meds:  Medications Prior to Admission  Medication Sig Dispense Refill Last Dose   aspirin 81 MG chewable tablet Chew 81 mg by mouth daily.   12/02/2022   Prenatal Vit-Fe Fumarate-FA (PRENATAL MULTIVITAMIN) TABS tablet Take 1 tablet by mouth daily at 12 noon.   12/02/2022   [DISCONTINUED] NIFEdipine (ADALAT CC) 30 MG 24 hr tablet Take 2 tablets (60 mg total) by mouth daily. (Patient taking differently: Take 90 mg by mouth daily. 60 AM, 30PM)   12/02/2022   cyclobenzaprine (FLEXERIL) 5 MG tablet Take 1 tablet (5 mg total) by mouth 3 (three) times daily as needed for muscle spasms. 30 tablet 0    metoCLOPramide (REGLAN) 10 MG tablet Take 1 tablet (10 mg total) by mouth every 6 (six) hours as needed for nausea. 30 tablet 1    ondansetron (ZOFRAN-ODT) 4 MG disintegrating tablet Take 1 tablet (4 mg total) by mouth every 6 (six)  hours as needed for nausea. 20 tablet 0     I have reviewed patient's Past Medical Hx, Surgical Hx, Family Hx, Social Hx, medications and allergies.   ROS:  Review of Systems Other systems negative  Physical Exam  Patient Vitals for the past 24 hrs:  BP Temp Temp src Pulse Resp SpO2 Height Weight  12/02/22 1530 (!) 141/86 -- -- 79 -- -- -- --  12/02/22 1515 (!) 144/92 -- -- 73 -- -- -- --  12/02/22 1500 130/73 -- -- 67 -- -- -- --  12/02/22 1445 128/75 -- -- 66 -- 96 % -- --  12/02/22 1430 (!) 142/88 -- -- 86 -- -- -- --  12/02/22 1415 (!) 156/94 -- -- 90 -- -- -- --  12/02/22 1400 (!) 154/93 -- -- 89 -- -- -- --  12/02/22 1347 (!) 154/85 -- -- 96 -- -- -- --  12/02/22 1324 (!) 155/101 97.9 F (36.6 C) Oral (!) 101 20 100 % -- --  12/02/22 1321 -- -- -- -- -- -- 5\' 1"  (1.549 m) 93.8 kg   Constitutional:  Well-developed, well-nourished female in no acute distress.  Cardiovascular: normal rate and rhythm Respiratory: normal effort, clear to auscultation bilaterally GI: Abd soft, non-tender, gravid appropriate for gestational age.   No rebound or guarding. MS: Extremities nontender, no edema, normal ROM Neurologic: Alert and oriented x 4.  GU: Neg CVAT.   FHT:  Baseline 160 , moderate variability, accelerations present, no decelerations Contractions: Rare, not perceived by patient   Labs: Results for orders placed or performed during the hospital encounter of 12/02/22 (from the past 24 hour(s))  Urinalysis, Routine w reflex microscopic -Urine, Clean Catch     Status: Abnormal   Collection Time: 12/02/22  1:55 PM  Result Value Ref Range   Color, Urine YELLOW YELLOW   APPearance HAZY (A) CLEAR   Specific Gravity, Urine 1.024 1.005 - 1.030   pH 6.0 5.0 - 8.0   Glucose, UA NEGATIVE NEGATIVE mg/dL   Hgb urine dipstick NEGATIVE NEGATIVE   Bilirubin Urine NEGATIVE NEGATIVE   Ketones, ur NEGATIVE NEGATIVE mg/dL   Protein, ur NEGATIVE NEGATIVE mg/dL   Nitrite NEGATIVE NEGATIVE   Leukocytes,Ua NEGATIVE NEGATIVE  Protein / creatinine ratio, urine     Status: None   Collection Time: 12/02/22  1:55 PM  Result Value Ref Range   Creatinine, Urine 192 mg/dL   Total Protein, Urine 15 mg/dL   Protein Creatinine Ratio 0.08 0.00 - 0.15 mg/mg[Cre]  Comprehensive metabolic panel     Status: Abnormal   Collection Time: 12/02/22  2:10 PM  Result Value Ref Range   Sodium 136 135 - 145 mmol/L   Potassium 4.0 3.5 - 5.1 mmol/L   Chloride 106 98 - 111 mmol/L   CO2 22 22 - 32 mmol/L   Glucose, Bld 103 (H) 70 - 99 mg/dL   BUN 8 6 - 20 mg/dL   Creatinine, Ser 0.98 0.44 - 1.00 mg/dL   Calcium 8.8 (L) 8.9 - 10.3 mg/dL   Total Protein 5.9 (L) 6.5 - 8.1 g/dL   Albumin 2.5 (L) 3.5 - 5.0 g/dL   AST 25 15 - 41 U/L   ALT 16 0 - 44 U/L   Alkaline Phosphatase 108 38 - 126 U/L   Total Bilirubin 0.6 <1.2 mg/dL    GFR, Estimated >11 >91 mL/min   Anion gap 8 5 - 15  CBC     Status: Abnormal   Collection Time: 12/02/22  2:10 PM  Result Value Ref Range   WBC 13.1 (H) 4.0 - 10.5 K/uL   RBC 4.15 3.87 - 5.11 MIL/uL   Hemoglobin 10.7 (L) 12.0 - 15.0 g/dL   HCT 78.2 (L) 95.6 - 21.3 %   MCV 79.0 (L) 80.0 - 100.0 fL   MCH 25.8 (L) 26.0 - 34.0 pg   MCHC 32.6 30.0 - 36.0 g/dL   RDW 08.6 57.8 - 46.9 %   Platelets 303 150 - 400 K/uL   nRBC 0.0 0.0 - 0.2 %   --/--/B POS (04/17 1631)  Imaging:  No results found.  MAU Course/MDM: I have reviewed the triage vital signs and the nursing notes.   Pertinent labs & imaging results that were available during my care of the patient were reviewed by me and considered in my medical decision making (see chart for details).      I have reviewed her medical records including past results, notes and treatments.   I have ordered labs and reviewed results.  NST reviewed  Treatments in MAU included Tylenol, CMP, CBC, Urine pro/cr, IV labetalol protocol(not needed).    Assessment: 1. [redacted] weeks gestation of pregnancy   2. Hx of preeclampsia, prior pregnancy, currently pregnant   3. Chronic hypertension affecting pregnancy   Headache resolved with tylenol.  Preeclampsia labs wnl Non-severe range BP similar to previous with known hx of CHTN on Procardia 60mg  in the morning and 30 mg in the evening  Plan: Discharge home Labor and Preeclampsia precautions and fetal kick counts provided Follow up in Office for prenatal visits and recheck  Follow-up Information     [facility] Follow up.   Why: As scheduled on 12/3 Sooner in MAU for emergencies Contact information: [address]  [address] [place] [address] [phone]               Pt stable at time of discharge.  Wyn Forster, MD FMOB Fellow, Faculty practice [facility], Center for [facility]  12/02/2022 4:04 PM

## 2022-12-02 NOTE — MAU Note (Signed)
.  Katrina Garcia is a 37 y.o. at [redacted]w[redacted]d here in MAU reporting: She reports she woke up with a HA around 0730. She reports it has not gone away. She reports she went back to sleep, woke up again, and went and took a shower. She reports her BP after her shower was in the 160's. She reports she repeated her BP three times after this and the systolic's were in the 150's. She reports her feet and ankles are also swollen. Reports floaters that started over this past weekend. Denies RUQ/epigastric pain. Denies VB or LOF. +FM.  Takes 90 mg Nifedipine each day. Takes 60 mg in the mornings. Last dose at 0800 this morning.  NPO:  1300 - Grapes on the way here   Scheduled for repeat C/S on 12/9. First C/S due to [facility].  Onset of complaint: This morning Pain score: 4/10 HA  Vitals:   12/02/22 1324  BP: (!) 155/101  Pulse: (!) 101  Resp: 20  Temp: 97.9 F (36.6 C)  SpO2: 100%      FHT: 160 initial external Lab orders placed from triage: UA

## 2022-12-02 NOTE — Telephone Encounter (Signed)
Preadmission screen  

## 2022-12-04 ENCOUNTER — Telehealth (HOSPITAL_COMMUNITY): Payer: Self-pay | Admitting: *Deleted

## 2022-12-04 NOTE — Telephone Encounter (Signed)
Preadmission screen  

## 2022-12-07 ENCOUNTER — Encounter (HOSPITAL_COMMUNITY): Payer: Self-pay

## 2022-12-08 DIAGNOSIS — O10013 Pre-existing essential hypertension complicating pregnancy, third trimester: Secondary | ICD-10-CM | POA: Diagnosis not present

## 2022-12-08 DIAGNOSIS — Z3A37 37 weeks gestation of pregnancy: Secondary | ICD-10-CM | POA: Diagnosis not present

## 2022-12-11 ENCOUNTER — Encounter (HOSPITAL_COMMUNITY)
Admission: RE | Admit: 2022-12-11 | Discharge: 2022-12-11 | Disposition: A | Discharge status: Home / Self Care | Payer: Federal, State, Local not specified - PPO | Source: Ambulatory Visit | Attending: Obstetrics and Gynecology | Admitting: Obstetrics and Gynecology

## 2022-12-11 VITALS — Ht 61.0 in | Wt 206.0 lb

## 2022-12-11 DIAGNOSIS — Z8249 Family history of ischemic heart disease and other diseases of the circulatory system: Secondary | ICD-10-CM | POA: Diagnosis not present

## 2022-12-11 DIAGNOSIS — O34219 Maternal care for unspecified type scar from previous cesarean delivery: Secondary | ICD-10-CM | POA: Insufficient documentation

## 2022-12-11 DIAGNOSIS — O1092 Unspecified pre-existing hypertension complicating childbirth: Secondary | ICD-10-CM | POA: Diagnosis not present

## 2022-12-11 DIAGNOSIS — Z01812 Encounter for preprocedural laboratory examination: Secondary | ICD-10-CM | POA: Insufficient documentation

## 2022-12-11 DIAGNOSIS — Z3A38 38 weeks gestation of pregnancy: Secondary | ICD-10-CM | POA: Insufficient documentation

## 2022-12-11 DIAGNOSIS — D251 Intramural leiomyoma of uterus: Secondary | ICD-10-CM | POA: Diagnosis not present

## 2022-12-11 DIAGNOSIS — O9902 Anemia complicating childbirth: Secondary | ICD-10-CM | POA: Diagnosis not present

## 2022-12-11 DIAGNOSIS — O3413 Maternal care for benign tumor of corpus uteri, third trimester: Secondary | ICD-10-CM | POA: Diagnosis not present

## 2022-12-11 DIAGNOSIS — Z87891 Personal history of nicotine dependence: Secondary | ICD-10-CM | POA: Diagnosis not present

## 2022-12-11 DIAGNOSIS — O99824 Streptococcus B carrier state complicating childbirth: Secondary | ICD-10-CM | POA: Diagnosis not present

## 2022-12-11 DIAGNOSIS — O34211 Maternal care for low transverse scar from previous cesarean delivery: Secondary | ICD-10-CM | POA: Diagnosis not present

## 2022-12-11 DIAGNOSIS — O114 Pre-existing hypertension with pre-eclampsia, complicating childbirth: Secondary | ICD-10-CM | POA: Diagnosis not present

## 2022-12-11 DIAGNOSIS — K219 Gastro-esophageal reflux disease without esophagitis: Secondary | ICD-10-CM | POA: Diagnosis not present

## 2022-12-11 DIAGNOSIS — O9962 Diseases of the digestive system complicating childbirth: Secondary | ICD-10-CM | POA: Diagnosis not present

## 2022-12-11 LAB — CBC
HCT: 35.3 % — ABNORMAL LOW (ref 36.0–46.0)
Hemoglobin: 11.7 g/dL — ABNORMAL LOW (ref 12.0–15.0)
MCH: 26.4 pg (ref 26.0–34.0)
MCHC: 33.1 g/dL (ref 30.0–36.0)
MCV: 79.7 fL — ABNORMAL LOW (ref 80.0–100.0)
Platelets: 323 10*3/uL (ref 150–400)
RBC: 4.43 MIL/uL (ref 3.87–5.11)
RDW: 13.7 % (ref 11.5–15.5)
WBC: 13.7 10*3/uL — ABNORMAL HIGH (ref 4.0–10.5)
nRBC: 0 % (ref 0.0–0.2)

## 2022-12-11 LAB — TYPE AND SCREEN
ABO/RH(D): B POS
Antibody Screen: NEGATIVE

## 2022-12-11 LAB — COMPREHENSIVE METABOLIC PANEL
ALT: 20 U/L (ref 0–44)
AST: 26 U/L (ref 15–41)
Albumin: 2.6 g/dL — ABNORMAL LOW (ref 3.5–5.0)
Alkaline Phosphatase: 144 U/L — ABNORMAL HIGH (ref 38–126)
Anion gap: 9 (ref 5–15)
BUN: 7 mg/dL (ref 6–20)
CO2: 20 mmol/L — ABNORMAL LOW (ref 22–32)
Calcium: 9 mg/dL (ref 8.9–10.3)
Chloride: 107 mmol/L (ref 98–111)
Creatinine, Ser: 0.71 mg/dL (ref 0.44–1.00)
GFR, Estimated: >60 mL/min (ref >60)
Glucose, Bld: 132 mg/dL — ABNORMAL HIGH (ref 70–99)
Potassium: 3.8 mmol/L (ref 3.5–5.1)
Sodium: 136 mmol/L (ref 135–145)
Total Bilirubin: 0.3 mg/dL (ref <1.2)
Total Protein: 5.9 g/dL — ABNORMAL LOW (ref 6.5–8.1)

## 2022-12-11 LAB — RPR: RPR Ser Ql: NONREACTIVE

## 2022-12-14 ENCOUNTER — Inpatient Hospital Stay (HOSPITAL_COMMUNITY): Payer: Federal, State, Local not specified - PPO | Admitting: Anesthesiology

## 2022-12-14 ENCOUNTER — Encounter (HOSPITAL_COMMUNITY): Payer: Self-pay | Admitting: Obstetrics and Gynecology

## 2022-12-14 ENCOUNTER — Inpatient Hospital Stay (HOSPITAL_COMMUNITY)
Admission: RE | Admit: 2022-12-14 | Discharge: 2022-12-17 | DRG: 787 | Disposition: A | Discharge status: Home / Self Care | Payer: Federal, State, Local not specified - PPO | Attending: Obstetrics and Gynecology | Admitting: Obstetrics and Gynecology

## 2022-12-14 ENCOUNTER — Other Ambulatory Visit: Payer: Self-pay

## 2022-12-14 ENCOUNTER — Encounter (HOSPITAL_COMMUNITY)
Admission: RE | Disposition: A | Discharge status: Home / Self Care | Payer: Self-pay | Source: Home / Self Care | Attending: Obstetrics and Gynecology

## 2022-12-14 DIAGNOSIS — K219 Gastro-esophageal reflux disease without esophagitis: Secondary | ICD-10-CM | POA: Diagnosis present

## 2022-12-14 DIAGNOSIS — O9962 Diseases of the digestive system complicating childbirth: Secondary | ICD-10-CM | POA: Diagnosis present

## 2022-12-14 DIAGNOSIS — O1092 Unspecified pre-existing hypertension complicating childbirth: Secondary | ICD-10-CM | POA: Diagnosis present

## 2022-12-14 DIAGNOSIS — O9902 Anemia complicating childbirth: Secondary | ICD-10-CM | POA: Diagnosis present

## 2022-12-14 DIAGNOSIS — Z87891 Personal history of nicotine dependence: Secondary | ICD-10-CM | POA: Diagnosis not present

## 2022-12-14 DIAGNOSIS — Z01812 Encounter for preprocedural laboratory examination: Secondary | ICD-10-CM | POA: Diagnosis not present

## 2022-12-14 DIAGNOSIS — O3413 Maternal care for benign tumor of corpus uteri, third trimester: Secondary | ICD-10-CM | POA: Diagnosis present

## 2022-12-14 DIAGNOSIS — Z9889 Other specified postprocedural states: Secondary | ICD-10-CM

## 2022-12-14 DIAGNOSIS — O99824 Streptococcus B carrier state complicating childbirth: Secondary | ICD-10-CM | POA: Diagnosis present

## 2022-12-14 DIAGNOSIS — Z8249 Family history of ischemic heart disease and other diseases of the circulatory system: Secondary | ICD-10-CM

## 2022-12-14 DIAGNOSIS — O134 Gestational [pregnancy-induced] hypertension without significant proteinuria, complicating childbirth: Secondary | ICD-10-CM | POA: Diagnosis not present

## 2022-12-14 DIAGNOSIS — O114 Pre-existing hypertension with pre-eclampsia, complicating childbirth: Principal | ICD-10-CM | POA: Diagnosis present

## 2022-12-14 DIAGNOSIS — Z3A38 38 weeks gestation of pregnancy: Secondary | ICD-10-CM | POA: Diagnosis not present

## 2022-12-14 DIAGNOSIS — Z3493 Encounter for supervision of normal pregnancy, unspecified, third trimester: Principal | ICD-10-CM

## 2022-12-14 DIAGNOSIS — O34211 Maternal care for low transverse scar from previous cesarean delivery: Secondary | ICD-10-CM | POA: Diagnosis not present

## 2022-12-14 DIAGNOSIS — D251 Intramural leiomyoma of uterus: Secondary | ICD-10-CM | POA: Diagnosis present

## 2022-12-14 DIAGNOSIS — D259 Leiomyoma of uterus, unspecified: Principal | ICD-10-CM

## 2022-12-14 LAB — COMPREHENSIVE METABOLIC PANEL
ALT: 23 U/L (ref 0–44)
AST: 32 U/L (ref 15–41)
Albumin: 3 g/dL — ABNORMAL LOW (ref 3.5–5.0)
Alkaline Phosphatase: 168 U/L — ABNORMAL HIGH (ref 38–126)
Anion gap: 10 (ref 5–15)
BUN: 5 mg/dL — ABNORMAL LOW (ref 6–20)
CO2: 21 mmol/L — ABNORMAL LOW (ref 22–32)
Calcium: 8.8 mg/dL — ABNORMAL LOW (ref 8.9–10.3)
Chloride: 104 mmol/L (ref 98–111)
Creatinine, Ser: 0.78 mg/dL (ref 0.44–1.00)
GFR, Estimated: >60 mL/min (ref >60)
Glucose, Bld: 78 mg/dL (ref 70–99)
Potassium: 3.6 mmol/L (ref 3.5–5.1)
Sodium: 135 mmol/L (ref 135–145)
Total Bilirubin: 0.6 mg/dL (ref <1.2)
Total Protein: 6.8 g/dL (ref 6.5–8.1)

## 2022-12-14 LAB — CBC
HCT: 37 % (ref 36.0–46.0)
Hemoglobin: 12.2 g/dL (ref 12.0–15.0)
MCH: 26.5 pg (ref 26.0–34.0)
MCHC: 33 g/dL (ref 30.0–36.0)
MCV: 80.4 fL (ref 80.0–100.0)
Platelets: 318 10*3/uL (ref 150–400)
RBC: 4.6 MIL/uL (ref 3.87–5.11)
RDW: 13.9 % (ref 11.5–15.5)
WBC: 14.1 10*3/uL — ABNORMAL HIGH (ref 4.0–10.5)
nRBC: 0 % (ref 0.0–0.2)

## 2022-12-14 LAB — PROTEIN / CREATININE RATIO, URINE
Creatinine, Urine: 21 mg/dL
Protein Creatinine Ratio: 0.38 mg/mg{creat} — ABNORMAL HIGH (ref 0.00–0.15)
Total Protein, Urine: 8 mg/dL

## 2022-12-14 SURGERY — Surgical Case
Anesthesia: Spinal

## 2022-12-14 MED ORDER — OXYCODONE HCL 5 MG PO TABS
5.0000 mg | ORAL_TABLET | ORAL | Status: DC | PRN
  Administered 2022-12-15 – 2022-12-17 (×5): 5 mg via ORAL
  Filled 2022-12-14 (×5): qty 1

## 2022-12-14 MED ORDER — LABETALOL HCL 5 MG/ML IV SOLN
INTRAVENOUS | Status: AC
  Filled 2022-12-14: qty 4

## 2022-12-14 MED ORDER — ACETAMINOPHEN 10 MG/ML IV SOLN
INTRAVENOUS | Status: DC | PRN
  Administered 2022-12-14: 1000 mg via INTRAVENOUS

## 2022-12-14 MED ORDER — OXYTOCIN-SODIUM CHLORIDE 30-0.9 UT/500ML-% IV SOLN
INTRAVENOUS | Status: DC | PRN
  Administered 2022-12-14: 30 [IU] via INTRAVENOUS

## 2022-12-14 MED ORDER — TRANEXAMIC ACID-NACL 1000-0.7 MG/100ML-% IV SOLN
INTRAVENOUS | Status: AC
  Filled 2022-12-14: qty 100

## 2022-12-14 MED ORDER — ZOLPIDEM TARTRATE 5 MG PO TABS: 5.0000 mg | ORAL_TABLET | Freq: Every evening | ORAL | Status: DC | PRN

## 2022-12-14 MED ORDER — SCOPOLAMINE 1 MG/3DAYS TD PT72
MEDICATED_PATCH | TRANSDERMAL | Status: AC
  Filled 2022-12-14: qty 1

## 2022-12-14 MED ORDER — NIFEDIPINE ER OSMOTIC RELEASE 60 MG PO TB24
60.0000 mg | ORAL_TABLET | Freq: Every day | ORAL | Status: DC
  Administered 2022-12-15 – 2022-12-17 (×3): 60 mg via ORAL
  Filled 2022-12-14 (×3): qty 1

## 2022-12-14 MED ORDER — SOD CITRATE-CITRIC ACID 500-334 MG/5ML PO SOLN
30.0000 mL | ORAL | Status: AC
  Administered 2022-12-14: 30 mL via ORAL

## 2022-12-14 MED ORDER — DEXMEDETOMIDINE HCL IN NACL 80 MCG/20ML IV SOLN
INTRAVENOUS | Status: DC | PRN
  Administered 2022-12-14 (×2): 4 ug via INTRAVENOUS

## 2022-12-14 MED ORDER — MAGNESIUM SULFATE 40 GM/1000ML IV SOLN
INTRAVENOUS | Status: AC
  Filled 2022-12-14: qty 1000

## 2022-12-14 MED ORDER — SOD CITRATE-CITRIC ACID 500-334 MG/5ML PO SOLN
ORAL | Status: AC
Start: 2022-12-14 — End: ?
  Filled 2022-12-14: qty 30

## 2022-12-14 MED ORDER — FENTANYL CITRATE (PF) 100 MCG/2ML IJ SOLN
INTRAMUSCULAR | Status: AC
  Filled 2022-12-14: qty 2

## 2022-12-14 MED ORDER — DIPHENHYDRAMINE HCL 25 MG PO CAPS: 25.0000 mg | ORAL_CAPSULE | ORAL | Status: DC | PRN

## 2022-12-14 MED ORDER — NALOXONE HCL 4 MG/10ML IJ SOLN: 1.0000 ug/kg/h | INTRAVENOUS | Status: DC | PRN

## 2022-12-14 MED ORDER — BUPIVACAINE IN DEXTROSE 0.75-8.25 % IT SOLN
INTRATHECAL | Status: DC | PRN
  Administered 2022-12-14: 1.6 mL via INTRATHECAL

## 2022-12-14 MED ORDER — CEFAZOLIN SODIUM-DEXTROSE 2-4 GM/100ML-% IV SOLN
INTRAVENOUS | Status: AC
Start: 2022-12-14 — End: ?
  Filled 2022-12-14: qty 100

## 2022-12-14 MED ORDER — HYDRALAZINE HCL 20 MG/ML IJ SOLN: 10.0000 mg | INTRAMUSCULAR | Status: DC | PRN

## 2022-12-14 MED ORDER — SCOPOLAMINE 1 MG/3DAYS TD PT72
1.0000 | MEDICATED_PATCH | Freq: Once | TRANSDERMAL | Status: AC
  Administered 2022-12-14: 1.5 mg via TRANSDERMAL

## 2022-12-14 MED ORDER — DIBUCAINE (PERIANAL) 1 % EX OINT: 1.0000 | TOPICAL_OINTMENT | CUTANEOUS | Status: DC | PRN

## 2022-12-14 MED ORDER — GABAPENTIN 100 MG PO CAPS
100.0000 mg | ORAL_CAPSULE | Freq: Every day | ORAL | Status: DC
  Administered 2022-12-14 – 2022-12-16 (×3): 100 mg via ORAL
  Filled 2022-12-14 (×3): qty 1

## 2022-12-14 MED ORDER — MORPHINE SULFATE (PF) 0.5 MG/ML IJ SOLN
INTRAMUSCULAR | Status: DC | PRN
  Administered 2022-12-14: 150 ug via INTRATHECAL

## 2022-12-14 MED ORDER — DIPHENHYDRAMINE HCL 25 MG PO CAPS: 25.0000 mg | ORAL_CAPSULE | Freq: Four times a day (QID) | ORAL | Status: DC | PRN

## 2022-12-14 MED ORDER — SIMETHICONE 80 MG PO CHEW
80.0000 mg | CHEWABLE_TABLET | Freq: Three times a day (TID) | ORAL | Status: DC
  Administered 2022-12-15 – 2022-12-17 (×7): 80 mg via ORAL
  Filled 2022-12-14 (×7): qty 1

## 2022-12-14 MED ORDER — FENTANYL CITRATE (PF) 100 MCG/2ML IJ SOLN
25.0000 ug | INTRAMUSCULAR | Status: DC | PRN
  Administered 2022-12-14: 50 ug via INTRAVENOUS

## 2022-12-14 MED ORDER — DEXAMETHASONE SODIUM PHOSPHATE 10 MG/ML IJ SOLN
INTRAMUSCULAR | Status: AC
  Filled 2022-12-14: qty 1

## 2022-12-14 MED ORDER — PHENYLEPHRINE 80 MCG/ML (10ML) SYRINGE FOR IV PUSH (FOR BLOOD PRESSURE SUPPORT)
PREFILLED_SYRINGE | INTRAVENOUS | Status: AC
  Filled 2022-12-14: qty 10

## 2022-12-14 MED ORDER — OXYTOCIN-SODIUM CHLORIDE 30-0.9 UT/500ML-% IV SOLN
INTRAVENOUS | Status: AC
Start: 2022-12-14 — End: ?
  Filled 2022-12-14: qty 500

## 2022-12-14 MED ORDER — DEXMEDETOMIDINE HCL IN NACL 80 MCG/20ML IV SOLN
INTRAVENOUS | Status: AC
  Filled 2022-12-14: qty 20

## 2022-12-14 MED ORDER — OXYTOCIN-SODIUM CHLORIDE 30-0.9 UT/500ML-% IV SOLN: 2.5000 [IU]/h | INTRAVENOUS | Status: AC

## 2022-12-14 MED ORDER — ACETAMINOPHEN 500 MG PO TABS: 1000.0000 mg | ORAL_TABLET | Freq: Four times a day (QID) | ORAL | Status: DC

## 2022-12-14 MED ORDER — SENNOSIDES-DOCUSATE SODIUM 8.6-50 MG PO TABS
2.0000 | ORAL_TABLET | Freq: Every day | ORAL | Status: DC
  Administered 2022-12-15 – 2022-12-17 (×3): 2 via ORAL
  Filled 2022-12-14 (×3): qty 2

## 2022-12-14 MED ORDER — MORPHINE SULFATE (PF) 0.5 MG/ML IJ SOLN
INTRAMUSCULAR | Status: AC
  Filled 2022-12-14: qty 10

## 2022-12-14 MED ORDER — MAGNESIUM SULFATE 40 GM/1000ML IV SOLN
2.0000 g/h | INTRAVENOUS | Status: AC
  Administered 2022-12-14 – 2022-12-15 (×2): 2 g/h via INTRAVENOUS
  Filled 2022-12-14: qty 1000

## 2022-12-14 MED ORDER — PRENATAL MULTIVITAMIN CH: 1.0000 | ORAL_TABLET | Freq: Every day | ORAL | Status: DC

## 2022-12-14 MED ORDER — ONDANSETRON HCL 4 MG/2ML IJ SOLN
INTRAMUSCULAR | Status: DC | PRN
  Administered 2022-12-14: 4 mg via INTRAVENOUS

## 2022-12-14 MED ORDER — PHENYLEPHRINE 80 MCG/ML (10ML) SYRINGE FOR IV PUSH (FOR BLOOD PRESSURE SUPPORT)
PREFILLED_SYRINGE | INTRAVENOUS | Status: DC | PRN
  Administered 2022-12-14 (×4): 80 ug via INTRAVENOUS

## 2022-12-14 MED ORDER — ONDANSETRON HCL 4 MG/2ML IJ SOLN
INTRAMUSCULAR | Status: AC
  Filled 2022-12-14: qty 2

## 2022-12-14 MED ORDER — SODIUM CHLORIDE 0.9% FLUSH: 3.0000 mL | INTRAVENOUS | Status: DC | PRN

## 2022-12-14 MED ORDER — DEXAMETHASONE SODIUM PHOSPHATE 10 MG/ML IJ SOLN
INTRAMUSCULAR | Status: DC | PRN
  Administered 2022-12-14: 10 mg via INTRAVENOUS

## 2022-12-14 MED ORDER — COCONUT OIL OIL
1.0000 | TOPICAL_OIL | Status: DC | PRN
  Administered 2022-12-15: 1 via TOPICAL

## 2022-12-14 MED ORDER — LABETALOL HCL 5 MG/ML IV SOLN: 10.0000 mg | Freq: Once | INTRAVENOUS | Status: DC | PRN

## 2022-12-14 MED ORDER — STERILE WATER FOR IRRIGATION IR SOLN
Status: DC | PRN
  Administered 2022-12-14: 1000 mL

## 2022-12-14 MED ORDER — LABETALOL HCL 5 MG/ML IV SOLN: 80.0000 mg | INTRAVENOUS | Status: DC | PRN

## 2022-12-14 MED ORDER — ACETAMINOPHEN 500 MG PO TABS
1000.0000 mg | ORAL_TABLET | Freq: Four times a day (QID) | ORAL | Status: DC
  Administered 2022-12-14 – 2022-12-17 (×11): 1000 mg via ORAL
  Filled 2022-12-14 (×12): qty 2

## 2022-12-14 MED ORDER — SODIUM CHLORIDE 0.9 % IR SOLN
Status: DC | PRN
  Administered 2022-12-14: 1

## 2022-12-14 MED ORDER — ENOXAPARIN SODIUM 40 MG/0.4ML IJ SOSY
40.0000 mg | PREFILLED_SYRINGE | INTRAMUSCULAR | Status: DC
  Administered 2022-12-15 – 2022-12-17 (×3): 40 mg via SUBCUTANEOUS
  Filled 2022-12-14 (×3): qty 0.4

## 2022-12-14 MED ORDER — KETOROLAC TROMETHAMINE 30 MG/ML IJ SOLN
30.0000 mg | Freq: Four times a day (QID) | INTRAMUSCULAR | Status: DC | PRN
  Administered 2022-12-14: 30 mg via INTRAVENOUS

## 2022-12-14 MED ORDER — ONDANSETRON HCL 4 MG/2ML IJ SOLN
4.0000 mg | Freq: Three times a day (TID) | INTRAMUSCULAR | Status: DC | PRN
  Administered 2022-12-14: 4 mg via INTRAVENOUS
  Filled 2022-12-14: qty 2

## 2022-12-14 MED ORDER — NALOXONE HCL 0.4 MG/ML IJ SOLN: 0.4000 mg | INTRAMUSCULAR | Status: DC | PRN

## 2022-12-14 MED ORDER — DIPHENHYDRAMINE HCL 50 MG/ML IJ SOLN: 12.5000 mg | INTRAMUSCULAR | Status: DC | PRN

## 2022-12-14 MED ORDER — LABETALOL HCL 5 MG/ML IV SOLN
20.0000 mg | INTRAVENOUS | Status: DC | PRN
  Administered 2022-12-14: 20 mg via INTRAVENOUS

## 2022-12-14 MED ORDER — SIMETHICONE 80 MG PO CHEW: 80.0000 mg | CHEWABLE_TABLET | ORAL | Status: DC | PRN

## 2022-12-14 MED ORDER — LACTATED RINGERS IV SOLN: INTRAVENOUS | Status: AC

## 2022-12-14 MED ORDER — ACETAMINOPHEN 10 MG/ML IV SOLN
INTRAVENOUS | Status: AC
  Filled 2022-12-14: qty 100

## 2022-12-14 MED ORDER — KETOROLAC TROMETHAMINE 30 MG/ML IJ SOLN
30.0000 mg | Freq: Four times a day (QID) | INTRAMUSCULAR | Status: AC
  Administered 2022-12-14 – 2022-12-15 (×3): 30 mg via INTRAVENOUS
  Filled 2022-12-14 (×3): qty 1

## 2022-12-14 MED ORDER — NIFEDIPINE ER OSMOTIC RELEASE 30 MG PO TB24
30.0000 mg | ORAL_TABLET | Freq: Every day | ORAL | Status: DC
  Administered 2022-12-14 – 2022-12-16 (×3): 30 mg via ORAL
  Filled 2022-12-14 (×3): qty 1

## 2022-12-14 MED ORDER — MENTHOL 3 MG MT LOZG: 1.0000 | LOZENGE | OROMUCOSAL | Status: DC | PRN

## 2022-12-14 MED ORDER — PRENATAL MULTIVITAMIN CH
1.0000 | ORAL_TABLET | Freq: Every day | ORAL | Status: DC
  Administered 2022-12-15 – 2022-12-17 (×3): 1 via ORAL
  Filled 2022-12-14 (×3): qty 1

## 2022-12-14 MED ORDER — LABETALOL HCL 5 MG/ML IV SOLN: 40.0000 mg | INTRAVENOUS | Status: DC | PRN

## 2022-12-14 MED ORDER — WITCH HAZEL-GLYCERIN EX PADS: 1.0000 | MEDICATED_PAD | CUTANEOUS | Status: DC | PRN

## 2022-12-14 MED ORDER — CEFAZOLIN SODIUM-DEXTROSE 2-4 GM/100ML-% IV SOLN
2.0000 g | INTRAVENOUS | Status: AC
  Administered 2022-12-14: 2 g via INTRAVENOUS

## 2022-12-14 MED ORDER — OXYTOCIN-SODIUM CHLORIDE 30-0.9 UT/500ML-% IV SOLN
INTRAVENOUS | Status: AC
  Filled 2022-12-14: qty 500

## 2022-12-14 MED ORDER — MAGNESIUM SULFATE BOLUS VIA INFUSION
4.0000 g | Freq: Once | INTRAVENOUS | Status: AC
  Administered 2022-12-14: 4 g via INTRAVENOUS
  Filled 2022-12-14: qty 1000

## 2022-12-14 MED ORDER — LACTATED RINGERS IV SOLN: INTRAVENOUS | Status: DC

## 2022-12-14 MED ORDER — KETOROLAC TROMETHAMINE 30 MG/ML IJ SOLN
INTRAMUSCULAR | Status: AC
  Filled 2022-12-14: qty 1

## 2022-12-14 MED ORDER — IBUPROFEN 600 MG PO TABS
600.0000 mg | ORAL_TABLET | Freq: Four times a day (QID) | ORAL | Status: DC
  Administered 2022-12-15 – 2022-12-17 (×8): 600 mg via ORAL
  Filled 2022-12-14 (×8): qty 1

## 2022-12-14 MED ORDER — FENTANYL CITRATE (PF) 100 MCG/2ML IJ SOLN
INTRAMUSCULAR | Status: DC | PRN
  Administered 2022-12-14: 15 ug via INTRATHECAL

## 2022-12-14 MED ORDER — PHENYLEPHRINE HCL-NACL 20-0.9 MG/250ML-% IV SOLN
INTRAVENOUS | Status: DC | PRN
  Administered 2022-12-14: 20 ug/min via INTRAVENOUS

## 2022-12-14 MED ORDER — KETOROLAC TROMETHAMINE 30 MG/ML IJ SOLN: 30.0000 mg | Freq: Four times a day (QID) | INTRAMUSCULAR | Status: DC | PRN

## 2022-12-14 MED ORDER — MEPERIDINE HCL 25 MG/ML IJ SOLN: 6.2500 mg | INTRAMUSCULAR | Status: DC | PRN

## 2022-12-14 SURGICAL SUPPLY — 34 items
BENZOIN TINCTURE PRP APPL 2/3 (GAUZE/BANDAGES/DRESSINGS) IMPLANT
CHLORAPREP W/TINT 26 (MISCELLANEOUS) ×2 IMPLANT
CLAMP UMBILICAL CORD (MISCELLANEOUS) ×1 IMPLANT
CLIP FILSHIE TUBAL LIGA STRL (Clip) IMPLANT
CLOTH BEACON ORANGE TIMEOUT ST (SAFETY) ×1 IMPLANT
DERMABOND ADVANCED .7 DNX12 (GAUZE/BANDAGES/DRESSINGS) IMPLANT
DRSG OPSITE POSTOP 4X10 (GAUZE/BANDAGES/DRESSINGS) ×1 IMPLANT
ELECT REM PT RETURN 9FT ADLT (ELECTROSURGICAL) ×1
ELECTRODE REM PT RTRN 9FT ADLT (ELECTROSURGICAL) ×1 IMPLANT
EXTRACTOR VACUUM KIWI (MISCELLANEOUS) IMPLANT
GAUZE SPONGE 4X4 12PLY STRL LF (GAUZE/BANDAGES/DRESSINGS) IMPLANT
GLOVE BIO SURGEON STRL SZ 6.5 (GLOVE) ×1 IMPLANT
GLOVE BIOGEL PI IND STRL 7.0 (GLOVE) ×1 IMPLANT
GOWN STRL REUS W/TWL LRG LVL3 (GOWN DISPOSABLE) ×2 IMPLANT
KIT ABG SYR 3ML LUER SLIP (SYRINGE) IMPLANT
NDL HYPO 25X5/8 SAFETYGLIDE (NEEDLE) IMPLANT
NEEDLE HYPO 22GX1.5 SAFETY (NEEDLE) IMPLANT
NEEDLE HYPO 25X5/8 SAFETYGLIDE (NEEDLE) IMPLANT
NS IRRIG 1000ML POUR BTL (IV SOLUTION) ×1 IMPLANT
PACK C SECTION WH (CUSTOM PROCEDURE TRAY) ×1 IMPLANT
PAD ABD DERMACEA PRESS 5X9 (GAUZE/BANDAGES/DRESSINGS) IMPLANT
PAD OB MATERNITY 4.3X12.25 (PERSONAL CARE ITEMS) ×1 IMPLANT
RTRCTR C-SECT PINK 25CM LRG (MISCELLANEOUS) ×1 IMPLANT
STRIP CLOSURE SKIN 1/2X4 (GAUZE/BANDAGES/DRESSINGS) IMPLANT
SUT CHROMIC 1 CTX 36 (SUTURE) ×2 IMPLANT
SUT CHROMIC 2 0 CT 1 (SUTURE) ×1 IMPLANT
SUT PDS AB 0 CTX 60 (SUTURE) ×1 IMPLANT
SUT VIC AB 2-0 CT1 TAPERPNT 27 (SUTURE) ×1 IMPLANT
SUT VIC AB 4-0 KS 27 (SUTURE) IMPLANT
SUTURE PLAIN GUT 2.0 ETHICON (SUTURE) IMPLANT
SYR 30ML LL (SYRINGE) IMPLANT
TOWEL OR 17X24 6PK STRL BLUE (TOWEL DISPOSABLE) ×1 IMPLANT
TRAY FOLEY W/BAG SLVR 14FR LF (SET/KITS/TRAYS/PACK) ×1 IMPLANT
WATER STERILE IRR 1000ML POUR (IV SOLUTION) ×1 IMPLANT

## 2022-12-14 NOTE — Lactation Note (Signed)
This note was copied from a baby's chart. Lactation Consultation Note  Patient Name: Katrina Garcia [id number] Date: 12/14/2022 Age:35 hours   P2- LC attempted to see MOB for initial consult at this time, but she was sleeping. LC introduced self to FOB and encouraged him to call LC when MOB wakes up if it is before 2300 tonight. If LC does not receive call, lactation team will attempt to see MOB again tomorrow 12/15/2022.   Dema Severin BS, IBCLC 12/14/2022, 7:05 PM

## 2022-12-14 NOTE — Anesthesia Procedure Notes (Signed)
Spinal  Patient location during procedure: OR Start time: 12/14/2022 12:10 PM End time: 12/14/2022 12:18 PM Reason for block: surgical anesthesia Staffing Performed: anesthesiologist  Anesthesiologist: Marcene Duos, MD Performed by: Marcene Duos, MD Authorized by: Marcene Duos, MD   Preanesthetic Checklist Completed: patient identified, IV checked, site marked, risks and benefits discussed, surgical consent, monitors and equipment checked, pre-op evaluation and timeout performed Spinal Block Patient position: sitting Prep: DuraPrep Patient monitoring: heart rate, cardiac monitor, continuous pulse ox and blood pressure Approach: midline Location: L4-5 Injection technique: single-shot Needle Needle type: Pencan  Needle gauge: 24 G Needle length: 9 cm Assessment Sensory level: T4 Events: CSF return and second provider

## 2022-12-14 NOTE — Op Note (Signed)
CESAREAN SECTION Procedure Note  Patient: Katrina Garcia is a 37 y.o. G3 now P81 s/p rLTCS @ [redacted]w[redacted]d  Preoperative Diagnosis: cHTN, history of prior C-section Postoperative Diagnosis: same, delivered  Procedure: scheduled repeat low transverse Cesarean section     Surgeon: Ambrose Mantle , MD Assistant Surgeon: Marlow Baars, MD  A skilled surgical assistant was necessary due to the complexity of this case.  Anesthesia: Spinal anesthesia   Findings: Normal appearing fallopian tubes bilaterally, and ovaries bilaterally.  Uterus with large (7cm) fundal intramural fibroid noted. Viable female infant in vertex presentation delivered at 12:41pm with weight 3690g (8 pounds 2.2 ounces) Apgars 9 and 9.  Estimated Blood Loss:          Specimens: Placenta to L&D for disposal         Complications:  None         Disposition: PACU - hemodynamically stable.         Condition: stable    Description of Procedure: The patient was taken to the operating room where spinal anesthesia was placed and found to be adequate.  The patient was placed in the dorsal supine position.  SCDs were applied and cycling. A foley catheter was inserted and draining. Ancef 2g was given for infection prophylaxis. A Traxxi retractor was placed on the patient's abdomen to provide adequate visualization of the surgical site. The patient was subsequently prepped and draped in the normal sterile fashion.    A low transverse skin incision was made with a scalpel at the site of the prior C-section scar and carried down to the level of the fascia with the Bovie.  The fascia was incised in the midline with the scalpel and extended laterally with curved Mayo scissors. Kocher clamps were applied to the superior fascial edge and the underlying rectus muscle was dissected off with bluntly and curved Mayo's scissors. The peritoneum was found free of adherent bowel and the peritoneal cavity was entered with Metzenbaum scissors.  The  uterus was identified and the alexis retractor was placed intraperitoneal.  A partial bladder flap was then created sharply with Metzenbaum scissors and separated from the lower uterine segment digitally.   A low transverse hysterotomy was then made with a scalpel.  The infant was found in the vertex presentation was delivered atraumatically and without difficulty with standard maneuvers. After 60 seconds of delayed cord clamping the cord was clamped and cut and the infant was handed off to the baby care team. The placenta was delivered with gentle traction on umbilical cord and manual massage of the uterine fundus. The uterus was externalized and was cleared of all clot and debris.  The hysterotomy was then closed with 0 Chromic in a running locked fashion,  followed by a figure of 8 stitch with 0 Chromic on the right side of the hysterotomy.  The hysterotomy was found to be hemostatic. The uterus was internalized. All peritoneal edges and muscle were made hemostatic with light Bovie use. The fascia was closed with a 0 looped PDS suture in a continuous running fashion.  The subcutaneous tissue was irrigated and rendered hemostatic with cautery.  The subcutaneous layer was subsequently closed with 2-0 Plain Gut in an interrupted fashion.  The skin was closed with 4-0 vicryl  in a running subcuticular fashion.  Sponge, lap and needle counts were correct. Steri strips and a Honeycomb dressing were placed on the incision and a pressure dressing was applied. The patient tolerated the procedure well and was  in stable condition when I left the OR.  Intra-op IV fluids: 800cc Intra-op UOP 200cc  A/P: Patient is stable for routine post-operative care. Antenatal BP medication dosing ordered, to be titrated postpartum. Ppx Lovenox ordered for elevated BMI.  Dispo: Transfer to PACU for recovery and then mother baby for routine care.  Ambrose Mantle, MD 12/14/2022 1:38 PM

## 2022-12-14 NOTE — Transfer of Care (Signed)
Immediate Anesthesia Transfer of Care Note  Patient: Katrina Garcia  Procedure(s) Performed: CESAREAN SECTION  Patient Location: PACU  Anesthesia Type:Spinal  Level of Consciousness: awake  Airway & Oxygen Therapy: Patient Spontanous Breathing  Post-op Assessment: Report given to RN and Post -op Vital signs reviewed and stable  Post vital signs: Reviewed and stable  Last Vitals:  Vitals Value Taken Time  BP    Temp    Pulse 71 12/14/22 1340  Resp 13 12/14/22 1340  SpO2 99 % 12/14/22 1340  Vitals shown include unfiled device data.  Last Pain: There were no vitals filed for this visit.       Complications: No notable events documented.

## 2022-12-14 NOTE — H&P (Signed)
Katrina Garcia is a 37 y.o. [phone] female at [redacted]w[redacted]d (by LMP overall c/w 7w [place]) presenting for scheduled repeat C-section for cHTN. She is currently taking Procardia 60mg  qam & 30mg  at bedtime, and has been stable on this dose. Her most recent pre-E labs (on 12/11/22) were normal. She denies HA, VC, CP, severe SOB, and RUQ pain. Denies VB, LOF, contractions, and decreased fetal movement.  Her pregnancy is otherwise complicated by: -fibroids: Most recently seen on growth [place] 10/3. 2 fibroids- posterior right (6cm) and left side (4.5cm). -elevated BMI: ~35 at new OB visit. -h/o preterm C-section: for pre-E and NRFS. Desires repeat. -h/o pre-E  OB History     Gravida  3   Para  1   Term      Preterm  1   AB  1   Living  1      SAB      IAB      Ectopic      Multiple  0   Live Births  1          Past Medical History:  Diagnosis Date   Abnormal Pap smear of cervix    ascus hpv hr+   Fibroid uterus 07/29/2013   Left subserosal 3.5 x 3.1 x 2.5 Left Intramural 1.2 x 1.3 x 1.4     HPV (human papilloma virus) infection    Hypertension    MDD (major depressive disorder)    Migraines    PTSD (post-traumatic stress disorder)    Past Surgical History:  Procedure Laterality Date   CESAREAN SECTION N/A 12/18/2013   Procedure: CESAREAN SECTION;  Surgeon: Levie Heritage, DO;  Location: [facility];  Service: Obstetrics;  Laterality: N/A;   WISDOM TOOTH EXTRACTION     Family History: family history includes Breast cancer in her paternal grandmother; Cancer in her paternal grandmother; Hypertension in her father and paternal grandmother. Social History:  reports that she quit smoking about 5 years ago. Her smoking use included cigarettes. She has never used smokeless tobacco. She reports that she does not currently use alcohol. She reports that she does not currently use drugs.     Maternal Diabetes: No Genetic Screening: Normal Maternal Ultrasounds/Referrals: Normal Fetal  Ultrasounds or other Referrals:  None Maternal Substance Abuse:  No Significant Maternal Medications:  Meds include: Other:  ASA, procardia Significant Maternal Lab Results:  Group B Strep positive Number of Prenatal Visits:greater than 3 verified prenatal visits Maternal Vaccinations:TDap and Flu Other Comments:  None  Review of Systems  All other systems reviewed and are negative.  Maternal Medical History:  Prenatal complications: PIH.   No bleeding, cholelithiasis, HIV, infection, IUGR, nephrolithiasis, oligohydramnios, placental abnormality, polyhydramnios, pre-eclampsia, preterm labor, substance abuse, thrombocytopenia or thrombophilia.   Prenatal Complications - Diabetes: none.     Blood pressure (!) 149/106, pulse 90, temperature 98.8 F (37.1 C), resp. rate 18, height 5' 1.5" (1.562 m), weight 97.7 kg, last menstrual period 03/20/2022, SpO2 99%. Maternal Exam:  Abdomen: Patient reports no abdominal tenderness. Surgical scars: low transverse.     Fetal Exam Fetal Monitor Review: Mode: hand-held doppler probe.   Baseline rate: 144.    Physical Exam Vitals reviewed.  Constitutional:      Appearance: Normal appearance.  HENT:     Head: Normocephalic.  Eyes:     Extraocular Movements: Extraocular movements intact.  Cardiovascular:     Comments: Well perfused Pulmonary:     Effort: Pulmonary effort is normal.  Abdominal:  Comments: Gravid, non-tender  Musculoskeletal:        General: Normal range of motion.     Cervical back: Normal range of motion.  Skin:    General: Skin is warm and dry.  Neurological:     General: No focal deficit present.     Mental Status: She is alert and oriented to person, place, and time.  Psychiatric:        Mood and Affect: Mood normal.        Behavior: Behavior normal.        Thought Content: Thought content normal.        Judgment: Judgment normal.     Prenatal labs: ABO, Rh: --/--/B POS (12/06 1015) Antibody: NEG (12/06  1015) Rubella: Immune (05/09 0000) RPR: NON REACTIVE (12/06 1010)  HBsAg: Negative (05/09 0000)  HIV: Non-reactive (05/09 0000)  GBS:   Positive 11/30/2022  Assessment/Plan: Katrina Garcia is a 37 y.o. [id number] female at [redacted]w[redacted]d (by LMP overall c/w 7w [place]) presenting for scheduled repeat C-section for cHTN.  -delivery planning: will need traxxi, Alexis, 2 Ivs for high hemorrhage risk in s/o fibroids. Starting Hgb 12.2.  cHTN: taking Procardia 60mg  qam & 30mg  at bedtime, and has been stable on this dose. Took this AM, anesthesia will not treat admission BP due to risk of hypotension with spinal. Her most recent pre-E labs (on 12/11/22)  -fibroids: Most recently seen on growth [place] 10/3. 2 fibroids- posterior right (6cm) and left side (4.5cm). -elevated BMI: ~35 at new OB visit. -h/o preterm C-section: for pre-E and NRFS. Desires repeat. -h/o pre-E   Dispo: Move forward with repeat C-section as schedule.  Katrina Garcia A Katrina Garcia 12/14/2022, 11:47 AM

## 2022-12-14 NOTE — Progress Notes (Signed)
Patient ID: Katrina Garcia, female   DOB: 1985-06-21, 37 y.o.   MRN: [id number]  POD#0 s/p repeat c/s, w/ CHTN  Pt developed severe range BPs in PACU. Given worsening BPs will proceed with magnesium for seizure prophylaxis  Results for orders placed or performed during the hospital encounter of 12/14/22 (from the past 24 hour(s))  CBC     Status: Abnormal   Collection Time: 12/14/22 10:26 AM  Result Value Ref Range   WBC 14.1 (H) 4.0 - 10.5 K/uL   RBC 4.60 3.87 - 5.11 MIL/uL   Hemoglobin 12.2 12.0 - 15.0 g/dL   HCT 29.5 28.4 - 13.2 %   MCV 80.4 80.0 - 100.0 fL   MCH 26.5 26.0 - 34.0 pg   MCHC 33.0 30.0 - 36.0 g/dL   RDW 44.0 10.2 - 72.5 %   Platelets 318 150 - 400 K/uL   nRBC 0.0 0.0 - 0.2 %  Comprehensive metabolic panel     Status: Abnormal   Collection Time: 12/14/22 11:49 AM  Result Value Ref Range   Sodium 135 135 - 145 mmol/L   Potassium 3.6 3.5 - 5.1 mmol/L   Chloride 104 98 - 111 mmol/L   CO2 21 (L) 22 - 32 mmol/L   Glucose, Bld 78 70 - 99 mg/dL   BUN 5 (L) 6 - 20 mg/dL   Creatinine, Ser 3.66 0.44 - 1.00 mg/dL   Calcium 8.8 (L) 8.9 - 10.3 mg/dL   Total Protein 6.8 6.5 - 8.1 g/dL   Albumin 3.0 (L) 3.5 - 5.0 g/dL   AST 32 15 - 41 U/L   ALT 23 0 - 44 U/L   Alkaline Phosphatase 168 (H) 38 - 126 U/L   Total Bilirubin 0.6 <1.2 mg/dL   GFR, Estimated >44 >03 mL/min   Anion gap 10 5 - 15  Protein / creatinine ratio, urine     Status: Abnormal   Collection Time: 12/14/22  3:25 PM  Result Value Ref Range   Creatinine, Urine 21 mg/dL   Total Protein, Urine 8 mg/dL   Protein Creatinine Ratio 0.38 (H) 0.00 - 0.15 mg/mg[Cre]

## 2022-12-14 NOTE — Lactation Note (Signed)
This note was copied from a baby's chart. Lactation Consultation Note  Patient Name: Katrina Garcia [id number] Date: 12/14/2022 Age:35 hours Reason for consult: Initial assessment;Early term 37-38.6wks  P2- MOB reports that infant latches well and denies any pain or pinching. MOB plans to offer both breast and formula. MOB reported that she starts with breastfeeding first, then offers formula if infant still seems hungry. LC praised MOB for offering feedings in this order. LC reassured MOB to she is doing great. MOB denies having any questions or concerns at this time.  LC reviewed feeding infant on cue 8-12x in 24 hrs, not allowing infant to go over 3 hrs without a feeding, CDC milk storage guidelines and LC services handout. LC encouraged MOB to call for further assistance as needed.  Maternal Data Has patient been taught Hand Expression?: Yes Does the patient have breastfeeding experience prior to this delivery?: Yes How long did the patient breastfeed?: 6 weeks  Feeding Mother's Current Feeding Choice: Breast Milk and Formula   Lactation Tools Discussed/Used Pump Education: Milk Storage  Interventions Interventions: Breast feeding basics reviewed;Position options;Education;Pace feeding;LC Services brochure  Discharge Discharge Education: Warning signs for feeding baby Pump: DEBP;Personal  Consult Status Consult Status: Follow-up Date: 12/15/22 Follow-up type: In-patient    Dema Severin BS, IBCLC 12/14/2022, 10:10 PM

## 2022-12-14 NOTE — Anesthesia Preprocedure Evaluation (Signed)
Anesthesia Evaluation  Patient identified by MRN, date of birth, ID band Patient awake    Reviewed: Allergy & Precautions, NPO status , Patient's Chart, lab work & pertinent test results  Airway Mallampati: II  TM Distance: >3 FB     Dental  (+) Dental Advisory Given   Pulmonary former smoker   breath sounds clear to auscultation       Cardiovascular hypertension,  Rhythm:Regular Rate:Normal     Neuro/Psych  Headaches    GI/Hepatic Neg liver ROS,GERD  ,,  Endo/Other  negative endocrine ROS    Renal/GU negative Renal ROS     Musculoskeletal   Abdominal   Peds  Hematology  (+) Blood dyscrasia, anemia   Anesthesia Other Findings   Reproductive/Obstetrics (+) Pregnancy                             Anesthesia Physical Anesthesia Plan  ASA: 3  Anesthesia Plan: Spinal   Post-op Pain Management:    Induction:   PONV Risk Score and Plan: 2 and Dexamethasone and Ondansetron  Airway Management Planned: Natural Airway  Additional Equipment:   Intra-op Plan:   Post-operative Plan:   Informed Consent: I have reviewed the patients History and Physical, chart, labs and discussed the procedure including the risks, benefits and alternatives for the proposed anesthesia with the patient or authorized representative who has indicated his/her understanding and acceptance.       Plan Discussed with:   Anesthesia Plan Comments:        Anesthesia Quick Evaluation

## 2022-12-15 LAB — COMPREHENSIVE METABOLIC PANEL
ALT: 20 U/L (ref 0–44)
AST: 29 U/L (ref 15–41)
Albumin: 2.4 g/dL — ABNORMAL LOW (ref 3.5–5.0)
Alkaline Phosphatase: 137 U/L — ABNORMAL HIGH (ref 38–126)
Anion gap: 8 (ref 5–15)
BUN: 6 mg/dL (ref 6–20)
CO2: 25 mmol/L (ref 22–32)
Calcium: 7.4 mg/dL — ABNORMAL LOW (ref 8.9–10.3)
Chloride: 102 mmol/L (ref 98–111)
Creatinine, Ser: 0.71 mg/dL (ref 0.44–1.00)
GFR, Estimated: >60 mL/min (ref >60)
Glucose, Bld: 136 mg/dL — ABNORMAL HIGH (ref 70–99)
Potassium: 4.3 mmol/L (ref 3.5–5.1)
Sodium: 135 mmol/L (ref 135–145)
Total Bilirubin: 0.4 mg/dL (ref <1.2)
Total Protein: 5.6 g/dL — ABNORMAL LOW (ref 6.5–8.1)

## 2022-12-15 LAB — CBC
HCT: 31.9 % — ABNORMAL LOW (ref 36.0–46.0)
Hemoglobin: 10.8 g/dL — ABNORMAL LOW (ref 12.0–15.0)
MCH: 27.1 pg (ref 26.0–34.0)
MCHC: 33.9 g/dL (ref 30.0–36.0)
MCV: 79.9 fL — ABNORMAL LOW (ref 80.0–100.0)
Platelets: 292 10*3/uL (ref 150–400)
RBC: 3.99 MIL/uL (ref 3.87–5.11)
RDW: 13.8 % (ref 11.5–15.5)
WBC: 18.4 10*3/uL — ABNORMAL HIGH (ref 4.0–10.5)
nRBC: 0 % (ref 0.0–0.2)

## 2022-12-15 LAB — MAGNESIUM: Magnesium: 5.4 mg/dL — ABNORMAL HIGH (ref 1.7–2.4)

## 2022-12-15 NOTE — Progress Notes (Signed)
MOB was referred for history of depression and PTSD. * Referral screened out by Clinical Social Worker because none of the following criteria appear to apply: ~ History of anxiety/depression during this pregnancy, or of post-partum depression following prior delivery. No concerns noted in MOB's prenatal records.  ~ Diagnosis of anxiety and/or depression within last 3 years. Per chart review, MOB's depression and PTSD dates back to 2014.  OR * MOB's symptoms currently being treated with medication and/or therapy.  Please contact the Clinical Social Worker if needs arise, by [facility] request, or if MOB scores greater than 9/yes to question 10 on Edinburgh Postpartum Depression Screen.  Celso Sickle, LCSW Clinical Social Worker [facility] Cell#: [phone]

## 2022-12-15 NOTE — Anesthesia Postprocedure Evaluation (Signed)
Anesthesia Post Note  Patient: Katrina Garcia  Procedure(s) Performed: CESAREAN SECTION     Patient location during evaluation: PACU Anesthesia Type: Spinal Level of consciousness: awake and alert Pain management: pain level controlled Vital Signs Assessment: post-procedure vital signs reviewed and stable Respiratory status: spontaneous breathing and respiratory function stable Cardiovascular status: blood pressure returned to baseline and stable Postop Assessment: spinal receding Anesthetic complications: no   No notable events documented.  Last Vitals:  Vitals:   12/15/22 0500 12/15/22 0800  BP:  134/81  Pulse:  74  Resp: 18 18  Temp:    SpO2:  99%    Last Pain:  Vitals:   12/15/22 0810  TempSrc:   PainSc: 2                  Kennieth Rad

## 2022-12-15 NOTE — Progress Notes (Signed)
Subjective: Postpartum Day 1: Cesarean Delivery Patient reports no flatus, pain controlled, flushed while on magnesium  Objective: Vital signs in last 24 hours: Temp:  [97.6 F (36.4 C)-97.9 F (36.6 C)] 97.7 F (36.5 C) (12/10 0346) Pulse Rate:  [60-108] 74 (12/10 0800) Resp:  [8-22] 18 (12/10 1100) BP: (120-162)/(75-121) 134/81 (12/10 0800) SpO2:  [90 %-100 %] 99 % (12/10 0800)  Physical Exam:  General: alert, cooperative, and no distress Lochia: appropriate Uterine Fundus: firm Incision: no significant drainage, Honeycomb over incision DVT Evaluation: No evidence of DVT seen on physical exam. No significant calf/ankle edema.  Recent Labs    12/14/22 1026 12/15/22 0417  HGB 12.2 10.8*  HCT 37.0 31.9*    Assessment/Plan: 37 yo [id number] POD#1 s/p rLTCS for cHTN in the setting of prior c/s - PPC: Doing well, continue routine care - cHTN w/ SI preE w/ SF: s/p 24 hours magnesium sulfate. Continue Procardia 60/30. Titrate PRN after magnesium DC - DVT ppx: lovenox   Junius Creamer, DO 12/15/2022, 12:46 PM

## 2022-12-16 NOTE — Lactation Note (Signed)
This note was copied from a baby's chart. Lactation Consultation Note  Patient Name: Katrina Garcia [id number] Date: 12/16/2022 Age:35 hours Reason for consult: Follow-up assessment;Early term 37-38.6wks;Infant weight loss;Mother's request;RN request;Breastfeeding assistance (7 % weight loss) Baby awake after nurse assessment, LC offered to see if she would latch and she did after several attempts. Baby fed 14 mins with a few swallows, increased with warm moist heat. The football position was used with good support and LC mentioned its a good position to use when she is learning to latch.  LC recommended to feed with feeding cues and by 3 hours.  Offer the breast 1st , let her feed on the 1st breast if still hungry offer the 2nd breast. If needed supplement with EBM or formula.  Post pump both breast 15 mins , save milk for the next feeding.    Maternal Data Has patient been taught Hand Expression?: Yes (few drops)  Feeding Mother's Current Feeding Choice: Breast Milk and Formula Nipple Type: Slow - flow  LATCH Score Latch: Repeated attempts needed to sustain latch, nipple held in mouth throughout feeding, stimulation needed to elicit sucking reflex.  Audible Swallowing: A few with stimulation  Type of Nipple: Everted at rest and after stimulation  Comfort (Breast/Nipple): Soft / non-tender  Hold (Positioning): Assistance needed to correctly position infant at breast and maintain latch.  LATCH Score: 7   Lactation Tools Discussed/Used Tools: Pump Breast pump type: Double-Electric Breast Pump Pump Education: Milk Storage;Setup, frequency, and cleaning;Other (comment) (DEBP waas already set up) Reason for Pumping: MOB had been on MagSo4 due to [facility]  Interventions Interventions: Breast feeding basics reviewed;Assisted with latch;Skin to skin;Breast massage;Hand express;Breast compression;Adjust position;Support pillows;Position options;Hand pump;DEBP;Education;LC Services  brochure  Discharge Pump: Hands Free;Personal;DEBP;Manual  Consult Status Consult Status: Follow-up Date: 12/17/22 Follow-up type: In-patient    Matilde Sprang Lauri Purdum 12/16/2022, 3:58 PM

## 2022-12-16 NOTE — Progress Notes (Signed)
Patient is doing well.  She is tolerating PO, ambulating, voiding.  Pain is controlled.  She reports passing a few quarter sized clots with voiding overnight.  Bleeding on pad has otherwise been minimal.  Magnesium suflate was discontinued mid-day yesterday and blood pressure control has been excellent  Vitals:   12/15/22 1927 12/16/22 0007 12/16/22 0341 12/16/22 0810  BP: 116/77 119/74 138/83 132/82  Pulse: 83 85 (!) 56 92  Resp: 18 18 16 18   Temp: 98.3 F (36.8 C) 98 F (36.7 C) 97.6 F (36.4 C) 98.2 F (36.8 C)  TempSrc: Oral Oral Oral Oral  SpO2: 100%  99% 100%  Weight:      Height:        NAD Abdomen:  soft, appropriate tenderness, incisions intact and without erythema or drainage Peripad: scant heme ext:    Symmetric, trace edema bilaterally  Lab Results  Component Value Date   WBC 18.4 (H) 12/15/2022   HGB 10.8 (L) 12/15/2022   HCT 31.9 (L) 12/15/2022   MCV 79.9 (L) 12/15/2022   PLT 292 12/15/2022    --/--/B POS (12/06 1015)  A/P    37 y.o. [id number] POD #2 s/p RCS at 38 weeks for [facility]  Routine post op and postpartum care.  Reviewed expectations for bleeding in the postpartum period.  Will monitor closely but patient reassured.  Desires to stay today CHTN with superimposed preeclampsia due to severe range blood pressures immediately post op.  S/p 24 hours PP magnesium.  BP control now excellent on home procardia Xl 60/30.  Will monitor closely today.  Anticipate discharge tomorrow DVT prophlyaxis: lovenox

## 2022-12-17 MED ORDER — SIMETHICONE 80 MG PO CHEW: 80.0000 mg | CHEWABLE_TABLET | ORAL | 0 refills | Status: AC | PRN

## 2022-12-17 MED ORDER — NIFEDIPINE ER 30 MG PO TB24: 30.0000 mg | ORAL_TABLET | Freq: Every day | ORAL | 1 refills | Status: AC

## 2022-12-17 MED ORDER — GABAPENTIN 100 MG PO CAPS: 100.0000 mg | ORAL_CAPSULE | Freq: Every day | ORAL | 0 refills | Status: AC

## 2022-12-17 MED ORDER — IBUPROFEN 800 MG PO TABS: 800.0000 mg | ORAL_TABLET | Freq: Three times a day (TID) | ORAL | 1 refills | Status: AC | PRN

## 2022-12-17 MED ORDER — NIFEDIPINE ER 60 MG PO TB24: 60.0000 mg | ORAL_TABLET | Freq: Every day | ORAL | 1 refills | Status: AC

## 2022-12-17 MED ORDER — OXYCODONE HCL 5 MG PO TABS: 5.0000 mg | ORAL_TABLET | Freq: Four times a day (QID) | ORAL | 0 refills | Status: AC | PRN

## 2022-12-17 NOTE — Discharge Summary (Signed)
Postpartum Discharge Summary  Date of Service updated     Patient Name: Katrina Garcia DOB: June 12, 1985 MRN: [id number]  Date of admission: 12/14/2022 Delivery date:12/14/2022 Delivering provider: Ambrose Mantle A Date of discharge: 12/17/2022  Admitting diagnosis: Pregnant and not yet delivered in third trimester [Z34.93] Post-operative state [Z98.890] Intrauterine pregnancy: [redacted]w[redacted]d     Secondary diagnosis:  Principal Problem:   Pregnant and not yet delivered in third trimester Active Problems:   Post-operative state  Additional problems: Fibroids, CHTN on Rx    Discharge diagnosis: Term Pregnancy Delivered and CHTN with superimposed preeclampsia                                              Post partum procedures: none Augmentation: N/A Complications: None  Hospital course: Sceduled C/S   37 y.o. yo [id number] at [redacted]w[redacted]d was admitted to the hospital 12/14/2022 for scheduled cesarean section with the following indication:Elective Repeat.Delivery details are as follows:  Membrane Rupture Time/Date: 12:40 PM,12/14/2022  Delivery Method:C-Section, Low Transverse Details of operation can be found in separate operative note.  Patient had a postpartum course complicated by MgSO4.  She is ambulating, tolerating a regular diet, passing flatus, and urinating well. Patient is discharged home in stable condition on  12/17/22        Newborn Data: Birth date:12/14/2022 Birth time:12:41 PM Gender:Female Living status:Living Apgars:9 ,9  Weight:3690 g    Magnesium Sulfate received: Yes: Seizure prophylaxis Immunizations administered: Immunization History  Administered Date(s) Administered   Influenza,inj,Quad PF,6+ Mos 09/13/2013   Tdap 11/01/2013    Physical exam  Vitals:   12/16/22 2053 12/17/22 0001 12/17/22 0430 12/17/22 0822  BP: 134/81 134/89 138/66 (!) 143/93  Pulse: 87 98 68 85  Resp: 19 19 19 20   Temp: 98.1 F (36.7 C) 98.6 F (37 C) 98.6 F (37 C) 97.8 F (36.6 C)   TempSrc: Oral Oral Oral Oral  SpO2: 100% 100% 99% 99%  Weight:      Height:       General: alert, cooperative, and no distress Lochia: appropriate Uterine Fundus: firm Incision: Healing well with no significant drainage, No significant erythema DVT Evaluation: No evidence of DVT seen on physical exam. Negative Homan's sign. No cords or calf tenderness. Labs: Lab Results  Component Value Date   WBC 18.4 (H) 12/15/2022   HGB 10.8 (L) 12/15/2022   HCT 31.9 (L) 12/15/2022   MCV 79.9 (L) 12/15/2022   PLT 292 12/15/2022      Latest Ref Rng & Units 12/15/2022    4:17 AM  CMP  Glucose 70 - 99 mg/dL 027   BUN 6 - 20 mg/dL 6   Creatinine 2.53 - 6.64 mg/dL 4.03   Sodium 474 - 259 mmol/L 135   Potassium 3.5 - 5.1 mmol/L 4.3   Chloride 98 - 111 mmol/L 102   CO2 22 - 32 mmol/L 25   Calcium 8.9 - 10.3 mg/dL 7.4   Total Protein 6.5 - 8.1 g/dL 5.6   Total Bilirubin <5.6 mg/dL 0.4   Alkaline Phos 38 - 126 U/L 137   AST 15 - 41 U/L 29   ALT 0 - 44 U/L 20    Edinburgh Score:    12/15/2022    1:41 PM  Edinburgh Postnatal Depression Scale Screening Tool  I have been able to laugh and see the funny  side of things. 0  I have looked forward with enjoyment to things. 0  I have blamed myself unnecessarily when things went wrong. 1  I have been anxious or worried for no good reason. 2  I have felt scared or panicky for no good reason. 1  Things have been getting on top of me. 1  I have been so unhappy that I have had difficulty sleeping. 2  I have felt sad or miserable. 1  I have been so unhappy that I have been crying. 0  The thought of harming myself has occurred to me. 0  Edinburgh Postnatal Depression Scale Total 8      After visit meds:  Allergies as of 12/17/2022   No Known Allergies      Medication List     STOP taking these medications    aspirin 81 MG chewable tablet   cyclobenzaprine 5 MG tablet Commonly known as: FLEXERIL   metoCLOPramide 10 MG  tablet Commonly known as: Reglan   ondansetron 4 MG disintegrating tablet Commonly known as: ZOFRAN-ODT       TAKE these medications    gabapentin 100 MG capsule Commonly known as: NEURONTIN Take 1 capsule (100 mg total) by mouth at bedtime.   ibuprofen 800 MG tablet Commonly known as: ADVIL Take 1 tablet (800 mg total) by mouth every 8 (eight) hours as needed.   NIFEdipine 60 MG 24 hr tablet Commonly known as: ADALAT CC Take 1 tablet (60 mg total) by mouth daily with breakfast. What changed:  medication strength how much to take when to take this additional instructions   NIFEdipine 30 MG 24 hr tablet Commonly known as: ADALAT CC Take 1 tablet (30 mg total) by mouth at bedtime. What changed: You were already taking a medication with the same name, and this prescription was added. Make sure you understand how and when to take each.   oxyCODONE 5 MG immediate release tablet Commonly known as: Oxy IR/ROXICODONE Take 1 tablet (5 mg total) by mouth every 6 (six) hours as needed for severe pain (pain score 7-10).   prenatal multivitamin Tabs tablet Take 1 tablet by mouth daily at 12 noon.   simethicone 80 MG chewable tablet Commonly known as: MYLICON Chew 1 tablet (80 mg total) by mouth as needed for flatulence.         Discharge home in stable condition Infant Feeding: Bottle and Breast Infant Disposition:home with mother Discharge instruction: per After Visit Summary and Postpartum booklet. Activity: Advance as tolerated. Pelvic rest for 6 weeks.  Diet: low salt diet Anticipated Birth Control: Unsure Postpartum Appointment:6 weeks Additional Postpartum F/U: BP check 1 week Future Appointments:No future appointments. Follow up Visit: [facility]   12/17/2022 Carlisle Cater, MD

## 2022-12-17 NOTE — Progress Notes (Addendum)
Patient is doing well.  She is tolerating PO, ambulating, voiding.  Pain is controlled.  Denies PreE symptoms. VB is improving and stable per patient, reviewed expected course postpartum esp in setting of prior magnesium sulfate. BP has been normotensive.   Vitals:   12/16/22 1639 12/16/22 2053 12/17/22 0001 12/17/22 0430  BP: 125/74 134/81 134/89 138/66  Pulse: (!) 101 87 98 68  Resp: 18 19 19 19   Temp: 98 F (36.7 C) 98.1 F (36.7 C) 98.6 F (37 C) 98.6 F (37 C)  TempSrc: Oral Oral Oral Oral  SpO2: 100% 100% 100% 99%  Weight:      Height:        NAD Abdomen:  soft, appropriate tenderness, incisions intact and without erythema or drainage. Palpable fibroid burden consistent with known 7cm fundal IM fibroid.  Peripad: scant heme ext:    Symmetric, trace edema bilaterally  Lab Results  Component Value Date   WBC 18.4 (H) 12/15/2022   HGB 10.8 (L) 12/15/2022   HCT 31.9 (L) 12/15/2022   MCV 79.9 (L) 12/15/2022   PLT 292 12/15/2022    --/--/B POS (12/06 1015)  A/P    37 y.o. [id number] POD #3 s/p RCS at 38 weeks for [facility]  Routine post op and postpartum care.   CHTN with superimposed preeclampsia due to severe range blood pressures immediately post op.  S/p 24 hours PP magnesium.  BP control now excellent on home procardia Xl 60/30.  DC home today with BP check in 1wk DVT prophlyaxis: lovenox

## 2022-12-17 NOTE — Lactation Note (Signed)
This note was copied from a baby's chart. Lactation Consultation Note  Patient Name: Katrina Garcia [id number] Date: 12/17/2022 Age:35 hours  Reason for consult: Follow-up assessment;Early term 37-38.6wks;Infant weight loss  P2, [redacted]w[redacted]d, 7.88% weight loss  Mother states she has been pumping and latching baby. She reports she's "latching better" and bottle feeding well. Mother was able to express 10 ml this am and feels that her breast are heavier. Encouraged to continue breast feed and/or pump to establish milk production.   Discussed the process of milk production, "supply and demand" and the importance of breast stimulation and milk removal in order to make an optimal milk supply.  Discussed mother to breastfeed 8-12 times in 24 hours, skin to skin and breast feed before formula feeding.   If missed feedings at breast or substituting feeding with formula, advised to hand express and/or pump to remove milk from the breast.      Feeding Mother's Current Feeding Choice: Breast Milk and Formula Nipple Type: Slow - flow  LATCH Score  Not observed     Lactation Tools Discussed/Used Pumped volume: 10 mL  Interventions Interventions: Education  Discharge Discharge Education: Engorgement and breast care;Warning signs for feeding baby  Consult Status Consult Status: Complete Date: 12/17/22    Omar Person 12/17/2022, 12:35 PM

## 2022-12-22 ENCOUNTER — Inpatient Hospital Stay (HOSPITAL_COMMUNITY)
Admission: AD | Admit: 2022-12-22 | Discharge: 2022-12-23 | Disposition: A | Discharge status: Home / Self Care | Payer: Federal, State, Local not specified - PPO | Attending: Obstetrics and Gynecology | Admitting: Obstetrics and Gynecology

## 2022-12-22 ENCOUNTER — Telehealth (HOSPITAL_COMMUNITY): Payer: Self-pay

## 2022-12-22 ENCOUNTER — Inpatient Hospital Stay (HOSPITAL_COMMUNITY): Payer: Federal, State, Local not specified - PPO

## 2022-12-22 ENCOUNTER — Encounter (HOSPITAL_COMMUNITY): Payer: Self-pay | Admitting: Obstetrics and Gynecology

## 2022-12-22 DIAGNOSIS — K2289 Other specified disease of esophagus: Secondary | ICD-10-CM | POA: Insufficient documentation

## 2022-12-22 DIAGNOSIS — O1003 Pre-existing essential hypertension complicating the puerperium: Secondary | ICD-10-CM | POA: Insufficient documentation

## 2022-12-22 DIAGNOSIS — Z98891 History of uterine scar from previous surgery: Secondary | ICD-10-CM | POA: Diagnosis not present

## 2022-12-22 DIAGNOSIS — O9963 Diseases of the digestive system complicating the puerperium: Secondary | ICD-10-CM | POA: Diagnosis not present

## 2022-12-22 DIAGNOSIS — R0789 Other chest pain: Secondary | ICD-10-CM | POA: Insufficient documentation

## 2022-12-22 DIAGNOSIS — K2100 Gastro-esophageal reflux disease with esophagitis, without bleeding: Secondary | ICD-10-CM | POA: Insufficient documentation

## 2022-12-22 DIAGNOSIS — R1013 Epigastric pain: Secondary | ICD-10-CM | POA: Diagnosis not present

## 2022-12-22 DIAGNOSIS — R7401 Elevation of levels of liver transaminase levels: Secondary | ICD-10-CM

## 2022-12-22 LAB — COMPREHENSIVE METABOLIC PANEL
ALT: 93 U/L — ABNORMAL HIGH (ref 0–44)
AST: 33 U/L (ref 15–41)
Albumin: 2.9 g/dL — ABNORMAL LOW (ref 3.5–5.0)
Alkaline Phosphatase: 108 U/L (ref 38–126)
Anion gap: 9 (ref 5–15)
BUN: 12 mg/dL (ref 6–20)
CO2: 28 mmol/L (ref 22–32)
Calcium: 8.6 mg/dL — ABNORMAL LOW (ref 8.9–10.3)
Chloride: 103 mmol/L (ref 98–111)
Creatinine, Ser: 0.73 mg/dL (ref 0.44–1.00)
GFR, Estimated: >60 mL/min (ref >60)
Glucose, Bld: 105 mg/dL — ABNORMAL HIGH (ref 70–99)
Potassium: 3.1 mmol/L — ABNORMAL LOW (ref 3.5–5.1)
Sodium: 140 mmol/L (ref 135–145)
Total Bilirubin: 0.6 mg/dL (ref <1.2)
Total Protein: 6.3 g/dL — ABNORMAL LOW (ref 6.5–8.1)

## 2022-12-22 LAB — BRAIN NATRIURETIC PEPTIDE: B Natriuretic Peptide: 11.5 pg/mL (ref 0.0–100.0)

## 2022-12-22 LAB — CBC
HCT: 34.2 % — ABNORMAL LOW (ref 36.0–46.0)
Hemoglobin: 11.2 g/dL — ABNORMAL LOW (ref 12.0–15.0)
MCH: 26.5 pg (ref 26.0–34.0)
MCHC: 32.7 g/dL (ref 30.0–36.0)
MCV: 80.9 fL (ref 80.0–100.0)
Platelets: 384 10*3/uL (ref 150–400)
RBC: 4.23 MIL/uL (ref 3.87–5.11)
RDW: 13.5 % (ref 11.5–15.5)
WBC: 9.4 10*3/uL (ref 4.0–10.5)
nRBC: 0 % (ref 0.0–0.2)

## 2022-12-22 LAB — TROPONIN I (HIGH SENSITIVITY): Troponin I (High Sensitivity): 12 ng/L (ref <18)

## 2022-12-22 MED ORDER — LIDOCAINE VISCOUS HCL 2 % MT SOLN
15.0000 mL | Freq: Once | OROMUCOSAL | Status: AC
  Administered 2022-12-22: 15 mL via ORAL
  Filled 2022-12-22: qty 15

## 2022-12-22 MED ORDER — ALUM & MAG HYDROXIDE-SIMETH 200-200-20 MG/5ML PO SUSP
30.0000 mL | Freq: Once | ORAL | Status: AC
  Administered 2022-12-22: 30 mL via ORAL
  Filled 2022-12-22: qty 30

## 2022-12-22 MED ORDER — FAMOTIDINE 20 MG PO TABS
20.0000 mg | ORAL_TABLET | Freq: Once | ORAL | Status: AC
  Administered 2022-12-22: 20 mg via ORAL
  Filled 2022-12-22: qty 1

## 2022-12-22 MED ORDER — PANTOPRAZOLE SODIUM 20 MG PO TBEC: 20.0000 mg | DELAYED_RELEASE_TABLET | Freq: Every day | ORAL | 0 refills | Status: AC

## 2022-12-22 NOTE — MAU Note (Signed)
.  Katrina Garcia is a 37 y.o. at Unknown here in MAU reporting: started having mid sternal chest pain around 4pm today. Pain is sharp and feels like pressureHas tried drinking ginger ale ,tea to see if it would help thinks it may be gas but called OB and they told her to come in to make sure it is not a clot. PP c-section 12/14/2022. Pain is more epigastric now but still there. Denies SOB. Is on nifedipine for elevated b/p and did get Mag post partum for PRe-e. LMP:  Onset of complaint: 4pm Pain score: 8 Vitals:   12/22/22 1937  BP: (!) 141/95  Pulse: (!) 0  Resp: 18  SpO2: 99%     FHT:n/a Lab orders placed from triage:  EKG

## 2022-12-22 NOTE — Telephone Encounter (Signed)
12/22/2022 1218  Name: JOELE ZENO MRN: [id number] DOB: [birth year]  Reason for Call:  Transition of Care Hospital Discharge Call  Contact Status: Patient Contact Status: Message  Language assistant needed:          Follow-Up Questions:    Inocente Salles Postnatal Depression Scale:  In the Past 7 Days:    PHQ2-9 Depression Scale:     Discharge Follow-up:    Post-discharge interventions: NA  Signature  Signe Colt

## 2022-12-22 NOTE — MAU Provider Note (Signed)
Chief Complaint:  Chest Pain   Event Date/Time   First Provider Initiated Contact with Patient 12/22/22 2050       HPI: TALESE Garcia is a 37 y.o. [id number] who presents to maternity admissions reporting substernal and epigastric chest pain since 4pm   Had a C/S 9 days ago.  Only tried ginger ale.  Reports some shortness of breath. History of chronic hypertension and is on Nifedipine. She denies urinary symptoms, h/a, dizziness, n/v, or fever/chills.    HPI RN Note: Katrina Garcia is a 37 y.o. at Unknown here in MAU reporting: started having mid sternal chest pain around 4pm today. Pain is sharp and feels like pressureHas tried drinking ginger ale ,tea to see if it would help thinks it may be gas but called OB and they told her to come in to make sure it is not a clot. PP c-section 12/14/2022. Pain is more epigastric now but still there. Denies SOB. Is on nifedipine for elevated b/p and did get Mag post partum for PRe-e. LMP:  Onset of complaint: 4pm  Past Medical History: Past Medical History:  Diagnosis Date   Abnormal Pap smear of cervix    ascus hpv hr+   Fibroid uterus 07/29/2013   Left subserosal 3.5 x 3.1 x 2.5 Left Intramural 1.2 x 1.3 x 1.4     HPV (human papilloma virus) infection    Hypertension    MDD (major depressive disorder)    Migraines    PTSD (post-traumatic stress disorder)     Past obstetric history: OB History  Gravida Para Term Preterm AB Living  3 2 1 1 1 2   SAB IAB Ectopic Multiple Live Births     0 2    # Outcome Date GA Lbr Len/2nd Weight Sex Type Anes PTL Lv  3 Term 12/14/22 [redacted]w[redacted]d  3690 g F CS-LTranv Spinal  LIV  2 Preterm 12/18/13 [redacted]w[redacted]d  2631 g M CS-LTranv EPI  LIV  1 AB             Past Surgical History: Past Surgical History:  Procedure Laterality Date   CESAREAN SECTION N/A 12/18/2013   Procedure: CESAREAN SECTION;  Surgeon: Levie Heritage, DO;  Location: [facility];  Service: Obstetrics;  Laterality: N/A;   CESAREAN SECTION N/A 12/14/2022    Procedure: CESAREAN SECTION;  Surgeon: Willa Frater, MD;  Location: [facility];  Service: Obstetrics;  Laterality: N/A;   WISDOM TOOTH EXTRACTION      Family History: Family History  Problem Relation Age of Onset   Hypertension Father    Cancer Paternal Grandmother        breast cancer   Hypertension Paternal Grandmother    Breast cancer Paternal Grandmother     Social History: Social History   Tobacco Use   Smoking status: Former    Current packs/day: 0.00    Types: Cigarettes    Quit date: 01/29/2017    Years since quitting: 5.8   Smokeless tobacco: Never  Vaping Use   Vaping status: Never Used  Substance Use Topics   Alcohol use: Not Currently   Drug use: Not Currently    Allergies: No Known Allergies  Meds:  Medications Prior to Admission  Medication Sig Dispense Refill Last Dose/Taking   ibuprofen (ADVIL) 800 MG tablet Take 1 tablet (800 mg total) by mouth every 8 (eight) hours as needed. 60 tablet 1 12/22/2022   NIFEdipine (ADALAT CC) 30 MG 24 hr tablet Take 1 tablet (30  mg total) by mouth at bedtime. 30 tablet 1 12/21/2022   NIFEdipine (ADALAT CC) 60 MG 24 hr tablet Take 1 tablet (60 mg total) by mouth daily with breakfast. 30 tablet 1 12/22/2022   oxyCODONE (OXY IR/ROXICODONE) 5 MG immediate release tablet Take 1 tablet (5 mg total) by mouth every 6 (six) hours as needed for severe pain (pain score 7-10). 28 tablet 0 12/21/2022   Prenatal Vit-Fe Fumarate-FA (PRENATAL MULTIVITAMIN) TABS tablet Take 1 tablet by mouth daily at 12 noon.   12/22/2022   gabapentin (NEURONTIN) 100 MG capsule Take 1 capsule (100 mg total) by mouth at bedtime. 30 capsule 0    simethicone (MYLICON) 80 MG chewable tablet Chew 1 tablet (80 mg total) by mouth as needed for flatulence. 30 tablet 0     I have reviewed patient's Past Medical Hx, Surgical Hx, Family Hx, Social Hx, medications and allergies.  ROS:  Review of Systems Other systems negative     Physical Exam  Patient  Vitals for the past 24 hrs:  BP Pulse Resp SpO2 Height Weight  12/22/22 2000 (!) 151/96 86 -- 98 % -- --  12/22/22 1957 (!) 147/88 85 -- -- -- --  12/22/22 1955 -- -- -- 99 % -- --  12/22/22 1937 (!) 141/95 (!) 0 18 99 % 5' 1.5" (1.562 m) 85.3 kg   Constitutional: Well-developed, well-nourished female in no acute distress.  Cardiovascular: normal rate and rhythm, no ectopy audible, S1 & S2 heard, no murmur Respiratory: normal effort, no distress. Lungs CTAB with no wheezes or crackles GI: Abd soft, non-tender.  Nondistended.  No rebound, No guarding.  Bowel Sounds audible  MS: Extremities nontender, no edema, normal ROM Neurologic: Alert and oriented x 4.   Grossly nonfocal. GU: Neg CVAT. Skin:  Warm and Dry Psych:  Affect appropriate.  PELVIC EXAM: Cervix pink, visually closed, without lesion, scant white creamy discharge, vaginal walls and external genitalia normal Bimanual exam: Cervix firm, anterior, neg CMT, uterus nontender, nonenlarged, adnexa without tenderness, enlargement, or mass  Exam is limited by body habitus.    Labs: No results found for this or any previous visit (from the past 24 hours). --/--/B POS (12/06 1015)  Imaging:  No results found.  MAU Course/MDM: I have reviewed the triage vital signs and the nursing notes.   Pertinent labs & imaging results that were available during my care of the patient were reviewed by me and considered in my medical decision making (see chart for details).      I have reviewed her medical records including past results, notes and treatments.   I have ordered labs as follows: Imaging ordered:  Results reviewed.   Consult ***.   Treatments in MAU included ***.   Pt stable at time of discharge. 2140:  chest pain resolved  Assessment: No diagnosis found.  Plan: Discharge home Recommend *** Rx sent for *** for ***   Encouraged to return here or to other Urgent Care/ED if she develops worsening of symptoms, increase in  pain, fever, or other concerning symptoms.   Wynelle Bourgeois CNM, MSN Certified Nurse-Midwife 12/22/2022 8:50 PM

## 2022-12-23 DIAGNOSIS — R7401 Elevation of levels of liver transaminase levels: Secondary | ICD-10-CM

## 2022-12-23 DIAGNOSIS — K2289 Other specified disease of esophagus: Secondary | ICD-10-CM

## 2022-12-23 DIAGNOSIS — R1013 Epigastric pain: Secondary | ICD-10-CM

## 2022-12-23 DIAGNOSIS — Z98891 History of uterine scar from previous surgery: Secondary | ICD-10-CM

## 2022-12-23 DIAGNOSIS — K2100 Gastro-esophageal reflux disease with esophagitis, without bleeding: Secondary | ICD-10-CM | POA: Diagnosis not present

## 2023-01-05 DIAGNOSIS — F4323 Adjustment disorder with mixed anxiety and depressed mood: Secondary | ICD-10-CM | POA: Diagnosis not present

## 2023-01-12 DIAGNOSIS — Z1331 Encounter for screening for depression: Secondary | ICD-10-CM | POA: Diagnosis not present

## 2023-01-13 DIAGNOSIS — F4323 Adjustment disorder with mixed anxiety and depressed mood: Secondary | ICD-10-CM | POA: Diagnosis not present

## 2023-01-19 ENCOUNTER — Other Ambulatory Visit: Payer: Self-pay | Admitting: Advanced Practice Midwife

## 2023-02-03 DIAGNOSIS — Z3202 Encounter for pregnancy test, result negative: Secondary | ICD-10-CM | POA: Diagnosis not present

## 2023-02-03 DIAGNOSIS — R87610 Atypical squamous cells of undetermined significance on cytologic smear of cervix (ASC-US): Secondary | ICD-10-CM | POA: Diagnosis not present

## 2023-04-08 DIAGNOSIS — S335XXA Sprain of ligaments of lumbar spine, initial encounter: Secondary | ICD-10-CM | POA: Diagnosis not present

## 2023-04-08 DIAGNOSIS — G43009 Migraine without aura, not intractable, without status migrainosus: Secondary | ICD-10-CM | POA: Diagnosis not present

## 2023-04-08 DIAGNOSIS — Z3202 Encounter for pregnancy test, result negative: Secondary | ICD-10-CM | POA: Diagnosis not present

## 2023-04-20 ENCOUNTER — Other Ambulatory Visit: Payer: Self-pay

## 2023-04-20 ENCOUNTER — Emergency Department (HOSPITAL_COMMUNITY)
Admission: EM | Admit: 2023-04-20 | Discharge: 2023-04-21 | Discharge status: Against Medical Advice | Attending: Emergency Medicine | Admitting: Emergency Medicine

## 2023-04-20 ENCOUNTER — Encounter (HOSPITAL_COMMUNITY): Payer: Self-pay | Admitting: Emergency Medicine

## 2023-04-20 DIAGNOSIS — M542 Cervicalgia: Secondary | ICD-10-CM | POA: Diagnosis not present

## 2023-04-20 DIAGNOSIS — M7918 Myalgia, other site: Secondary | ICD-10-CM | POA: Diagnosis not present

## 2023-04-20 DIAGNOSIS — R6883 Chills (without fever): Secondary | ICD-10-CM | POA: Diagnosis not present

## 2023-04-20 DIAGNOSIS — M549 Dorsalgia, unspecified: Secondary | ICD-10-CM | POA: Insufficient documentation

## 2023-04-20 DIAGNOSIS — R109 Unspecified abdominal pain: Secondary | ICD-10-CM | POA: Diagnosis not present

## 2023-04-20 DIAGNOSIS — R197 Diarrhea, unspecified: Secondary | ICD-10-CM | POA: Diagnosis not present

## 2023-04-20 DIAGNOSIS — R519 Headache, unspecified: Secondary | ICD-10-CM | POA: Insufficient documentation

## 2023-04-20 DIAGNOSIS — Z5321 Procedure and treatment not carried out due to patient leaving prior to being seen by health care provider: Secondary | ICD-10-CM | POA: Diagnosis not present

## 2023-04-20 DIAGNOSIS — R110 Nausea: Secondary | ICD-10-CM | POA: Diagnosis not present

## 2023-04-20 LAB — COMPREHENSIVE METABOLIC PANEL WITH GFR
ALT: 15 U/L (ref 0–44)
AST: 19 U/L (ref 15–41)
Albumin: 3.8 g/dL (ref 3.5–5.0)
Alkaline Phosphatase: 64 U/L (ref 38–126)
Anion gap: 12 (ref 5–15)
BUN: 9 mg/dL (ref 6–20)
CO2: 20 mmol/L — ABNORMAL LOW (ref 22–32)
Calcium: 8.5 mg/dL — ABNORMAL LOW (ref 8.9–10.3)
Chloride: 104 mmol/L (ref 98–111)
Creatinine, Ser: 0.83 mg/dL (ref 0.44–1.00)
GFR, Estimated: >60 mL/min (ref >60)
Glucose, Bld: 117 mg/dL — ABNORMAL HIGH (ref 70–99)
Potassium: 3.3 mmol/L — ABNORMAL LOW (ref 3.5–5.1)
Sodium: 136 mmol/L (ref 135–145)
Total Bilirubin: 0.5 mg/dL (ref 0.0–1.2)
Total Protein: 6.9 g/dL (ref 6.5–8.1)

## 2023-04-20 LAB — CBC WITH DIFFERENTIAL/PLATELET
Abs Immature Granulocytes: 0.03 10*3/uL (ref 0.00–0.07)
Basophils Absolute: 0 10*3/uL (ref 0.0–0.1)
Basophils Relative: 0 %
Eosinophils Absolute: 0.1 10*3/uL (ref 0.0–0.5)
Eosinophils Relative: 1 %
HCT: 36.4 % (ref 36.0–46.0)
Hemoglobin: 11.7 g/dL — ABNORMAL LOW (ref 12.0–15.0)
Immature Granulocytes: 0 %
Lymphocytes Relative: 7 %
Lymphs Abs: 0.5 10*3/uL — ABNORMAL LOW (ref 0.7–4.0)
MCH: 25.7 pg — ABNORMAL LOW (ref 26.0–34.0)
MCHC: 32.1 g/dL (ref 30.0–36.0)
MCV: 79.8 fL — ABNORMAL LOW (ref 80.0–100.0)
Monocytes Absolute: 0.4 10*3/uL (ref 0.1–1.0)
Monocytes Relative: 5 %
Neutro Abs: 6.2 10*3/uL (ref 1.7–7.7)
Neutrophils Relative %: 87 %
Platelets: 282 10*3/uL (ref 150–400)
RBC: 4.56 MIL/uL (ref 3.87–5.11)
RDW: 13.6 % (ref 11.5–15.5)
WBC: 7.2 10*3/uL (ref 4.0–10.5)
nRBC: 0 % (ref 0.0–0.2)

## 2023-04-20 LAB — LIPASE, BLOOD: Lipase: 40 U/L (ref 11–51)

## 2023-04-20 LAB — HCG, SERUM, QUALITATIVE: Preg, Serum: NEGATIVE

## 2023-04-20 MED ORDER — ONDANSETRON 4 MG PO TBDP
4.0000 mg | ORAL_TABLET | Freq: Once | ORAL | Status: AC
  Administered 2023-04-20: 4 mg via ORAL
  Filled 2023-04-20: qty 1

## 2023-04-20 NOTE — ED Provider Triage Note (Signed)
 Emergency Medicine Provider Triage Evaluation Note  Katrina Garcia , a 38 y.o. female  was evaluated in triage.  Pt complains of abdominal pain, nausea, diarrhea. Started around 3 pm today, has been persistent since then.  No vomiting.  Notes chills without fevers.  No history of similar.  No sick contacts.  States her menstrual cycle started yesterday and is normal for her.  She does not normally have particularly menstrual cycles.  History of C-section x 2.  Pain is generalized throughout.  Review of Systems  Positive:  Negative:   Physical Exam  BP (!) 162/105   Pulse 77   Temp 98.4 F (36.9 C)   Resp 13   Ht 5\' 1"  (1.549 m)   Wt 84.4 kg   SpO2 99%   Breastfeeding No   BMI 35.14 kg/m  Gen:   Awake, no distress   Resp:  Normal effort  MSK:   Moves extremities without difficulty  Other:    Medical Decision Making  Medically screening exam initiated at 7:49 PM.  Appropriate orders placed.  Kodee Drury Kiehn was informed that the remainder of the evaluation will be completed by another provider, this initial triage assessment does not replace that evaluation, and the importance of remaining in the ED until their evaluation is complete.     Sherra Dk, PA-C 04/20/23 1950

## 2023-04-20 NOTE — ED Triage Notes (Signed)
 Patient coming to ED for evaluation of abdominal pain.  Reports symptoms started around 3 PM today.  Has had chills, HA, neck pain, and back pain.  + Diarrhea.  States "when it comes it feels like something is twisting inside my stomach."

## 2023-04-21 DIAGNOSIS — R1030 Lower abdominal pain, unspecified: Secondary | ICD-10-CM | POA: Diagnosis not present

## 2023-04-21 DIAGNOSIS — R197 Diarrhea, unspecified: Secondary | ICD-10-CM | POA: Diagnosis not present

## 2023-04-21 NOTE — ED Notes (Signed)
Pt called multiple times no response  

## 2023-04-27 DIAGNOSIS — H16141 Punctate keratitis, right eye: Secondary | ICD-10-CM | POA: Diagnosis not present

## 2023-05-07 DIAGNOSIS — F339 Major depressive disorder, recurrent, unspecified: Secondary | ICD-10-CM | POA: Diagnosis not present

## 2023-05-07 DIAGNOSIS — I10 Essential (primary) hypertension: Secondary | ICD-10-CM | POA: Diagnosis not present

## 2023-05-07 DIAGNOSIS — R42 Dizziness and giddiness: Secondary | ICD-10-CM | POA: Diagnosis not present

## 2023-05-28 DIAGNOSIS — M791 Myalgia, unspecified site: Secondary | ICD-10-CM | POA: Diagnosis not present

## 2023-05-28 DIAGNOSIS — M545 Low back pain, unspecified: Secondary | ICD-10-CM | POA: Diagnosis not present

## 2023-05-28 DIAGNOSIS — M79641 Pain in right hand: Secondary | ICD-10-CM | POA: Diagnosis not present

## 2023-05-28 DIAGNOSIS — M542 Cervicalgia: Secondary | ICD-10-CM | POA: Diagnosis not present

## 2023-06-17 DIAGNOSIS — M6281 Muscle weakness (generalized): Secondary | ICD-10-CM | POA: Diagnosis not present

## 2023-06-17 DIAGNOSIS — S39012D Strain of muscle, fascia and tendon of lower back, subsequent encounter: Secondary | ICD-10-CM | POA: Diagnosis not present

## 2023-07-13 DIAGNOSIS — H11822 Conjunctivochalasis, left eye: Secondary | ICD-10-CM | POA: Diagnosis not present

## 2023-07-18 DIAGNOSIS — G43109 Migraine with aura, not intractable, without status migrainosus: Secondary | ICD-10-CM | POA: Diagnosis not present

## 2023-08-14 ENCOUNTER — Encounter (HOSPITAL_BASED_OUTPATIENT_CLINIC_OR_DEPARTMENT_OTHER): Payer: Self-pay | Admitting: Emergency Medicine

## 2023-08-14 ENCOUNTER — Emergency Department (HOSPITAL_BASED_OUTPATIENT_CLINIC_OR_DEPARTMENT_OTHER)
Admission: EM | Admit: 2023-08-14 | Discharge: 2023-08-14 | Disposition: A | Discharge status: Home / Self Care | Attending: Emergency Medicine | Admitting: Emergency Medicine

## 2023-08-14 ENCOUNTER — Emergency Department (HOSPITAL_BASED_OUTPATIENT_CLINIC_OR_DEPARTMENT_OTHER)

## 2023-08-14 DIAGNOSIS — S8012XA Contusion of left lower leg, initial encounter: Secondary | ICD-10-CM | POA: Insufficient documentation

## 2023-08-14 DIAGNOSIS — W230XXA Caught, crushed, jammed, or pinched between moving objects, initial encounter: Secondary | ICD-10-CM | POA: Diagnosis not present

## 2023-08-14 DIAGNOSIS — I10 Essential (primary) hypertension: Secondary | ICD-10-CM | POA: Insufficient documentation

## 2023-08-14 DIAGNOSIS — Y990 Civilian activity done for income or pay: Secondary | ICD-10-CM | POA: Diagnosis not present

## 2023-08-14 DIAGNOSIS — S80812A Abrasion, left lower leg, initial encounter: Secondary | ICD-10-CM | POA: Diagnosis not present

## 2023-08-14 DIAGNOSIS — M79605 Pain in left leg: Secondary | ICD-10-CM | POA: Diagnosis not present

## 2023-08-14 DIAGNOSIS — S8992XA Unspecified injury of left lower leg, initial encounter: Secondary | ICD-10-CM | POA: Diagnosis not present

## 2023-08-14 NOTE — ED Notes (Signed)
 Left lower leg was cleansed with wound wash and bacitracin ointment applied. Dressed wound to LLL with nonadherent dressing and wrapped with gauze.

## 2023-08-14 NOTE — ED Provider Notes (Signed)
 [name] Provider Note   CSN: [id number] Arrival date & time: 08/14/23  1249     Patient presents with: Leg Injury   Katrina Garcia is a 38 y.o. female.   Patient is a 38 year old female with a history of hypertension and migraines who is presenting to after an injury at work.  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  She denies any injury anywhere else.  She is having ongoing pain to the left lateral lower leg.  She did notice a small wound in that area.  She denies any significant knee or ankle pain.  The history is provided by the patient.       Prior to Admission medications   Medication Sig Start Date End Date Taking? Authorizing Provider  gabapentin  (NEURONTIN ) 100 MG capsule Take 1 capsule (100 mg total) by mouth at bedtime. 12/17/22   Shivaji, Lavonia HERO, MD  ibuprofen  (ADVIL ) 800 MG tablet Take 1 tablet (800 mg total) by mouth every 8 (eight) hours as needed. 12/17/22   Shivaji, Lavonia HERO, MD  NIFEdipine  (ADALAT  CC) 30 MG 24 hr tablet Take 1 tablet (30 mg total) by mouth at bedtime. 12/17/22   Shivaji, Lavonia HERO, MD  NIFEdipine  (ADALAT  CC) 60 MG 24 hr tablet Take 1 tablet (60 mg total) by mouth daily with breakfast. 12/17/22   Shivaji, Lavonia HERO, MD  oxyCODONE  (OXY IR/ROXICODONE ) 5 MG immediate release tablet Take 1 tablet (5 mg total) by mouth every 6 (six) hours as needed for severe pain (pain score 7-10). 12/17/22   Shivaji, Lavonia HERO, MD  pantoprazole  (PROTONIX ) 20 MG tablet Take 1 tablet (20 mg total) by mouth daily. 12/22/22   Trudy Earnie CROME, CNM  Prenatal Vit-Fe Fumarate-FA (PRENATAL MULTIVITAMIN) TABS tablet Take 1 tablet by mouth daily at 12 noon.    [provider]  simethicone  (MYLICON) 80 MG chewable tablet Chew 1 tablet (80  mg total) by mouth as needed for flatulence. 12/17/22   Shivaji, Lavonia HERO, MD    Allergies: Patient has no known allergies.    Review of Systems  Updated Vital Signs BP (!) 163/93 (BP Location: Left Arm)   Pulse 73   Temp 98.2 F (36.8 C) (Oral)   Resp 16   Ht 5' 1 (1.549 m)   Wt 84.4 kg   SpO2 100%   BMI 35.14 kg/m   Physical Exam Vitals and nursing note reviewed.  HENT:     Head: Normocephalic.  Cardiovascular:     Rate and Rhythm: Normal rate.     Pulses: Normal pulses.  Musculoskeletal:        General: Tenderness and signs of injury present.     Left knee: Normal.     Left ankle: Normal.       Legs:  Neurological:     Mental Status: She is alert.     (all labs ordered are listed, but only abnormal results are displayed) Labs Reviewed - No data to display  EKG: None  Radiology: DG Tibia/Fibula Left Result Date: 08/14/2023 CLINICAL DATA:  Left lower extremity pain after an injury. Bruising and abrasions. EXAM: LEFT TIBIA AND FIBULA - 2 VIEW COMPARISON:  None Available. FINDINGS: No acute osseous abnormality.  No radiopaque foreign  body. IMPRESSION: No acute osseous abnormality or radiopaque foreign body. Electronically Signed   By: Newell Eke M.D.   On: 08/14/2023 14:15     Procedures   Medications Ordered in the ED - No data to display                                  Medical Decision Making Amount and/or Complexity of Data Reviewed Radiology: ordered and independent interpretation performed. Decision-making details documented in ED Course.   Patient presenting due to an injury at work.  Findings on physical exam above.  No findings concerning for compartment syndrome, hematoma present.  I have independently visualized and interpreted pt's images today.  X-ray is negative for fracture.      Final diagnoses:  Contusion of left lower leg, initial encounter  Abrasion of anterior left lower leg, initial encounter    ED Discharge Orders      None          Doretha Folks, MD 08/14/23 1428

## 2023-08-14 NOTE — Discharge Instructions (Addendum)
 Elevate and ice your leg as needed over the next few days.  You can take 2 Tylenol  and 2 ibuprofen  together at the same time every 6 hours as needed for pain.  You can change the bandage daily.  If you run out of the bacitracin you can use Vaseline if the wound is still present.  X-rays today were normal.

## 2023-08-14 NOTE — ED Triage Notes (Signed)
 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

## 2023-08-17 ENCOUNTER — Emergency Department (HOSPITAL_BASED_OUTPATIENT_CLINIC_OR_DEPARTMENT_OTHER)
Admission: EM | Admit: 2023-08-17 | Discharge: 2023-08-17 | Disposition: A | Discharge status: Home / Self Care | Payer: Worker's Compensation

## 2023-08-17 ENCOUNTER — Encounter (HOSPITAL_BASED_OUTPATIENT_CLINIC_OR_DEPARTMENT_OTHER): Payer: Self-pay | Admitting: Urology

## 2023-08-17 DIAGNOSIS — I10 Essential (primary) hypertension: Secondary | ICD-10-CM | POA: Diagnosis not present

## 2023-08-17 DIAGNOSIS — F17210 Nicotine dependence, cigarettes, uncomplicated: Secondary | ICD-10-CM | POA: Insufficient documentation

## 2023-08-17 DIAGNOSIS — M79605 Pain in left leg: Secondary | ICD-10-CM | POA: Diagnosis present

## 2023-08-17 NOTE — ED Notes (Signed)
 Pt alert and oriented X 4 at the time of discharge. RR even and unlabored. No acute distress noted. Pt verbalized understanding of discharge instructions as discussed. Pt ambulatory to lobby at time of discharge.

## 2023-08-17 NOTE — ED Provider Notes (Signed)
  EMERGENCY DEPARTMENT AT [facility] [place] Provider Note   CSN: [id number] Arrival date & time: 08/17/23  1014     Patient presents with: No chief complaint on file.   Katrina Garcia is a 38 y.o. female.   HPI   38 year old female presents emergency department with complaints of return to work note.  Patient was initially seen on 08/14/2023 for a work-related injury.  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  Injury occurred on her left lower leg.  Was diagnosed with a contusion on her left leg with small abrasion as x-ray of the area was normal.  Since then, there has been feeling well.  States that she did notice more bruising as well as some continued swelling.  Does report some improvement in the pain.  Denies any pus drainage, expanding redness, fever, or repeat trauma/injury.  Presents emergency department requesting return to work note.  Past medical history significant for hypertension, HPV, migraine, PTSD, chronic fatigue, GERD  Prior to Admission medications   Medication Sig Start Date End Date Taking? Authorizing Provider  gabapentin  (NEURONTIN ) 100 MG capsule Take 1 capsule (100 mg total) by mouth at bedtime. 12/17/22   Shivaji, Lavonia HERO, MD  ibuprofen  (ADVIL ) 800 MG tablet Take 1 tablet (800 mg total) by mouth every 8 (eight) hours as needed. 12/17/22   Shivaji, Lavonia HERO, MD  NIFEdipine  (ADALAT  CC) 30 MG 24 hr tablet Take 1 tablet (30 mg total) by mouth at bedtime. 12/17/22   Shivaji, Lavonia HERO, MD  NIFEdipine  (ADALAT  CC) 60 MG 24 hr tablet Take 1 tablet (60 mg total) by mouth daily with breakfast. 12/17/22   Shivaji, Lavonia HERO, MD  oxyCODONE  (OXY IR/ROXICODONE ) 5 MG immediate release tablet Take 1 tablet (5 mg total) by mouth every 6 (six) hours as needed for severe pain (pain score 7-10). 12/17/22   Shivaji, Lavonia HERO, MD  pantoprazole  (PROTONIX ) 20 MG tablet Take 1 tablet (20 mg  total) by mouth daily. 12/22/22   Trudy Earnie CROME, CNM  Prenatal Vit-Fe Fumarate-FA (PRENATAL MULTIVITAMIN) TABS tablet Take 1 tablet by mouth daily at 12 noon.    [provider]  simethicone  (MYLICON) 80 MG chewable tablet Chew 1 tablet (80 mg total) by mouth as needed for flatulence. 12/17/22   Shivaji, Lavonia HERO, MD    Allergies: Patient has no known allergies.    Review of Systems  All other systems reviewed and are negative.   Updated Vital Signs There were no vitals taken for this visit.  Physical Exam Vitals and nursing note reviewed.  Constitutional:      General: She is not in acute distress.    Appearance: She is well-developed.  HENT:     Head: Normocephalic and atraumatic.  Eyes:     Conjunctiva/sclera: Conjunctivae normal.  Cardiovascular:     Rate and Rhythm: Normal rate and regular rhythm.     Heart sounds: No murmur heard. Pulmonary:     Effort: Pulmonary effort is normal. No respiratory distress.     Breath sounds: Normal breath sounds.  Abdominal:     Palpations: Abdomen is soft.     Tenderness: There is no abdominal tenderness.  Musculoskeletal:        General: No swelling.     Cervical back: Neck supple.     Comments: Superficial abrasion appreciated left lower extremity measuring 2.0 cm in diameter.  Surrounding ecchymosis.  Compartment soft and supple.  No extending erythema, palpable fluctuance/induration.  Pedal pulses as well as posterior tibial pulses 2+ bilaterally.  Full range of motion of bilateral lower extremities.  No obvious bony tenderness on exam.  Skin:    General: Skin is warm and dry.     Capillary Refill: Capillary refill takes less than 2 seconds.  Neurological:     Mental Status: She is alert.  Psychiatric:        Mood and Affect: Mood normal.     (all labs ordered are listed, but only abnormal results are displayed) Labs Reviewed - No data to display  EKG: None  Radiology: No results found.   Procedures    Medications Ordered in the ED - No data to display                                  Medical Decision Making  This patient presents to the ED for concern of work note, this involves an extensive number of treatment options, and is a complaint that carries with it a high risk of complications and morbidity.  The differential diagnosis includes cellulitis, erysipelas, necrotizing infection, abscess, ischemic limb, DVT, compartment syndrome, other   Co morbidities that complicate the patient evaluation  See HPI   Additional history obtained:  Additional history obtained from EMR External records from outside source obtained and reviewed including hospital records   Lab Tests:  N/a   Imaging Studies ordered:  N/a   Cardiac Monitoring: / EKG:  N/a   Consultations Obtained:  N/a   Problem List / ED Course / Critical interventions / Medication management  Leg pain Reevaluation of the patient showed that the patient stayed the same I have reviewed the patients home medicines and have made adjustments as needed   Social Determinants of Health:  Cigarette use, denies illicit drug use.   Test / Admission - Considered:  Leg pain Vitals signs significant for hypertension blood pressure 150/100. Otherwise within normal range and stable throughout visit. 38 year old female presents emergency department with complaints of return to work note.  Patient was initially seen on 08/14/2023 for a work-related injury.  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  Injury occurred on her left lower leg.  Was diagnosed with a contusion on her left leg with small abrasion as x-ray of the area was normal.  Since then, there has been feeling well.  States that she did notice more bruising as well as some continued swelling.  Does report some improvement in the pain.  Denies any pus drainage, expanding  redness, fever, or repeat trauma/injury.  Presents emergency department requesting return to work note. On exam, superficial abrasion appreciated left lower extremity as above with some surrounding healing ecchymosis.  No evidence clinically of abscess, cellulitis, necrotizing infection.  No evidence of compartment syndrome.  Pulses symmetric; low suspicion for ischemic limb.  No lower extremity edema concerning for DVT.  Patient manage presenting for return to work note earlier than prior written by ED provider.  Will recommend continued wound care at home and follow-up with primary care for continued assessment.  Treatment plan discussed with patient and she is understanding was agreeable to said plan.  Patient well-appearing, afebrile in no acute distress upon discharge. Worrisome signs and symptoms were discussed with the  patient, and the patient acknowledged understanding to return to the ED if noticed. Patient was stable upon discharge.       Final diagnoses:  None    ED Discharge Orders     None          Silver Wonda LABOR, [place] 08/17/23 1041    Neysa Caron PARAS, [place] 08/17/23 1419

## 2023-08-17 NOTE — Discharge Instructions (Signed)
 As discussed, continue to place antibiotic ointment on you wound on your left lower leg and change out bandages at least daily.  You may take Tylenol , ibuprofen  for pain.  Please do not hesitate to return to emergency department if the worrisome signs and symptoms discussed become apparent.

## 2023-08-17 NOTE — ED Triage Notes (Signed)
 Continued left leg pain after contusion on 8/9  Needs note for work to return, has note to stay out till Thursday but wants to return ealier

## 2023-08-21 ENCOUNTER — Encounter (HOSPITAL_BASED_OUTPATIENT_CLINIC_OR_DEPARTMENT_OTHER): Payer: Self-pay | Admitting: Emergency Medicine

## 2023-08-21 ENCOUNTER — Other Ambulatory Visit: Payer: Self-pay

## 2023-08-21 ENCOUNTER — Emergency Department (HOSPITAL_BASED_OUTPATIENT_CLINIC_OR_DEPARTMENT_OTHER)
Admission: EM | Admit: 2023-08-21 | Discharge: 2023-08-21 | Disposition: A | Discharge status: Home / Self Care | Attending: Emergency Medicine | Admitting: Emergency Medicine

## 2023-08-21 DIAGNOSIS — Z5189 Encounter for other specified aftercare: Secondary | ICD-10-CM

## 2023-08-21 DIAGNOSIS — Z4801 Encounter for change or removal of surgical wound dressing: Secondary | ICD-10-CM | POA: Diagnosis not present

## 2023-08-21 DIAGNOSIS — Z4800 Encounter for change or removal of nonsurgical wound dressing: Secondary | ICD-10-CM | POA: Insufficient documentation

## 2023-08-21 DIAGNOSIS — L03116 Cellulitis of left lower limb: Secondary | ICD-10-CM | POA: Diagnosis not present

## 2023-08-21 MED ORDER — DOXYCYCLINE HYCLATE 100 MG PO CAPS: 100.0000 mg | ORAL_CAPSULE | Freq: Two times a day (BID) | ORAL | 0 refills | Status: DC

## 2023-08-21 NOTE — Discharge Instructions (Signed)
You were seen for skin infection (cellulitis) in the emergency department.   At home, please take the antibiotics (doxycycline) we have prescribed you.  You may also take tylenol and ibuprofen for any pain that you have.   Check your MyChart online for the results of any tests that had not resulted by the time you left the emergency department.   Follow-up with your primary doctor in 2-3 days regarding your visit.    Return immediately to the emergency department if you experience any of the following: fevers, severe pain, rapid spread of the rash/redness, or any other concerning symptoms.    Thank you for visiting our Emergency Department. It was a pleasure taking care of you today.

## 2023-08-21 NOTE — ED Triage Notes (Signed)
 Pt reports she has a wound to left lower leg, reports yellow drainage from abrasion and increased pain

## 2023-08-21 NOTE — ED Provider Notes (Signed)
 [place] [facility]Y DE[facility] [place] Provider Note   CSN: [id number] Arrival date & time: 08/21/23  1601     Patient presents with: Wound Check   Katrina Garcia is a 38 y.o. female.   38 year old female who presents emergency department for wound check of her left lower extremity.  Patient reports that she had an abrasion of her leg after a work accident 1 week ago today.  Was seen twice in the emergency department.  Had x-rays that did not show acute abnormality.  Says that she has been putting topical antibiotics on it but that it seems like it is draining fluid at this point in time and is red and painful.  Says that the swelling has not gone down at all despite icing and elevating her leg.  No fevers or chills.       Prior to Admission medications   Medication Sig Start Date End Date Taking? Authorizing Provider  doxycycline  (VIBRAMYCIN ) 100 MG capsule Take 1 capsule (100 mg total) by mouth 2 (two) times daily. 08/21/23  Yes Yolande Lamar BROCKS, MD  gabapentin  (NEURONTIN ) 100 MG capsule Take 1 capsule (100 mg total) by mouth at bedtime. 12/17/22   Shivaji, Lavonia HERO, MD  ibuprofen  (ADVIL ) 800 MG tablet Take 1 tablet (800 mg total) by mouth every 8 (eight) hours as needed. 12/17/22   Shivaji, Lavonia HERO, MD  NIFEdipine  (ADALAT  CC) 30 MG 24 hr tablet Take 1 tablet (30 mg total) by mouth at bedtime. 12/17/22   Shivaji, Lavonia HERO, MD  NIFEdipine  (ADALAT  CC) 60 MG 24 hr tablet Take 1 tablet (60 mg total) by mouth daily with breakfast. 12/17/22   Shivaji, Lavonia HERO, MD  oxyCODONE  (OXY IR/ROXICODONE ) 5 MG immediate release tablet Take 1 tablet (5 mg total) by mouth every 6 (six) hours as needed for severe pain (pain score 7-10). 12/17/22   Shivaji, Lavonia HERO, MD  pantoprazole  (PROTONIX ) 20 MG tablet Take 1 tablet (20 mg total) by mouth daily. 12/22/22   Trudy Earnie CROME, CNM  Prenatal Vit-Fe Fumarate-FA (PRENATAL MULTIVITAMIN) TABS tablet Take 1 tablet by mouth daily at 12 noon.     [provider]  simethicone  (MYLICON) 80 MG chewable tablet Chew 1 tablet (80 mg total) by mouth as needed for flatulence. 12/17/22   Shivaji, Lavonia HERO, MD    Allergies: Patient has no known allergies.    Review of Systems  Updated Vital Signs BP (!) 178/109 (BP Location: Right Arm)   Pulse 60   Temp 99 F (37.2 C) (Oral)   Resp 17   Ht 5' 1 (1.549 m)   Wt 83.9 kg   SpO2 99%   BMI 34.96 kg/m   Physical Exam Musculoskeletal:     Right lower leg: No edema.     Left lower leg: No edema.     Comments: Erythema and warmth of left lower extremity near abrasion.  No significant drainage noted.  No tenderness palpation past the edges of the erythema.  No subcutaneous emphysema.  See image below for wound.   Left lower extremity:   (all labs ordered are listed, but only abnormal results are displayed) Labs Reviewed - No data to display  EKG: None  Radiology: No results found.   Procedures   Medications Ordered in the ED - No data to display  Medical Decision Making Risk Prescription drug management.   Katrina Garcia is a 38 y.o. female with comorbidities that complicate the patient evaluation including wound to the left lower extremity presents emergency department with redness and drainage from wound  Initial Ddx:  Cellulitis, abscess, neck Fash, sepsis  MDM/Course:  Patient presents the emergency department with redness around the wound that she sustained a week ago.  Had x-rays of her wound when she was in the emergency department at that point in time that did not show any acute abnormalities.  Does have some erythema around the wound which I suspect is due to cellulitis.  Not having any systemic symptoms that would suggest sepsis.  Will go ahead and start her on doxycycline  and have her follow-up with her outpatient team.  Also is complaining of a hyperpigmented meditated area on her face that appears to be consistent  with rosacea.  Will have her follow-up with dermatology about this.    This patient presents to the ED for concern of complaints listed in HPI, this involves an extensive number of treatment options, and is a complaint that carries with it a high risk of complications and morbidity. Disposition including potential need for admission considered.   Dispo: DC Home. Return precautions discussed including, but not limited to, those listed in the AVS. Allowed pt time to ask questions which were answered fully prior to dc.  Records reviewed ED Visit Notes I have reviewed the patients home medications and made adjustments as needed  Portions of this note were generated with Dragon dictation software. Dictation errors may occur despite best attempts at proofreading.     Final diagnoses:  Visit for wound check  Cellulitis of left lower extremity    ED Discharge Orders          Ordered    doxycycline  (VIBRAMYCIN ) 100 MG capsule  2 times daily        08/21/23 1621               Yolande Lamar BROCKS, MD 08/21/23 [phone]

## 2023-08-30 ENCOUNTER — Emergency Department (HOSPITAL_BASED_OUTPATIENT_CLINIC_OR_DEPARTMENT_OTHER)

## 2023-08-30 ENCOUNTER — Encounter (HOSPITAL_BASED_OUTPATIENT_CLINIC_OR_DEPARTMENT_OTHER): Payer: Self-pay | Admitting: Emergency Medicine

## 2023-08-30 ENCOUNTER — Other Ambulatory Visit: Payer: Self-pay

## 2023-08-30 ENCOUNTER — Emergency Department (HOSPITAL_BASED_OUTPATIENT_CLINIC_OR_DEPARTMENT_OTHER)
Admission: EM | Admit: 2023-08-30 | Discharge: 2023-08-30 | Disposition: A | Discharge status: Home / Self Care | Payer: Worker's Compensation | Source: Ambulatory Visit

## 2023-08-30 DIAGNOSIS — M7989 Other specified soft tissue disorders: Secondary | ICD-10-CM | POA: Insufficient documentation

## 2023-08-30 DIAGNOSIS — S8012XD Contusion of left lower leg, subsequent encounter: Secondary | ICD-10-CM | POA: Insufficient documentation

## 2023-08-30 DIAGNOSIS — X58XXXD Exposure to other specified factors, subsequent encounter: Secondary | ICD-10-CM | POA: Diagnosis not present

## 2023-08-30 DIAGNOSIS — Z5189 Encounter for other specified aftercare: Secondary | ICD-10-CM | POA: Insufficient documentation

## 2023-08-30 DIAGNOSIS — M79605 Pain in left leg: Secondary | ICD-10-CM | POA: Diagnosis not present

## 2023-08-30 NOTE — ED Notes (Signed)
 Patient transported to Ultrasound

## 2023-08-30 NOTE — ED Triage Notes (Signed)
 Pt here for wound check left lower leg fro work accident on 8/16  has been taking antiobiotics she was rx  states wound not getting better  still swollen and red,  rest and elevation help but when she  go back to do anything it swelling

## 2023-08-30 NOTE — Discharge Instructions (Addendum)
 Please read and follow all provided instructions.  Your diagnoses today include:  1. Visit for wound check   2. Contusion of left lower extremity, subsequent encounter   3. Swelling of left lower extremity     Tests performed today include: Ultrasound of the lower leg: was negative for blood clot in the veins, does show a suspected hematoma Vital signs. See below for your results today.   Medications prescribed:  None  Take any prescribed medications only as directed.  Home care instructions:  Follow any educational materials contained in this packet.  BE VERY CAREFUL not to take multiple medicines containing Tylenol  (also called acetaminophen ). Doing so can lead to an overdose which can damage your liver and cause liver failure and possibly death.   Follow-up instructions: Please follow-up with your primary care provider as needed for further evaluation of your symptoms.  If the swelling is not improved after about 4 weeks, you should have the area reimaged to make sure that it is resolving.  Return instructions:  Please return to the Emergency Department if you experience worsening symptoms.  Please return if you have any other emergent concerns.  Additional Information:  Your vital signs today were: BP (!) 169/100 (BP Location: Right Arm)   Pulse 74   Temp 97.9 F (36.6 C) (Oral)   Resp 16   Ht 5' 1 (1.549 m)   Wt 83.9 kg   SpO2 100%   BMI 34.95 kg/m  If your blood pressure (BP) was elevated above 135/85 this visit, please have this repeated by your doctor within one month. --------------

## 2023-08-30 NOTE — ED Provider Notes (Signed)
 [place] [facility] [place] Provider Note   CSN: [id number] Arrival date & time: 08/30/23  9062     Patient presents with: Wound Check   Katrina Garcia is a 38 y.o. female.   Patient presents to the emergency department for evaluation of left lower extremity injury.  She was initially evaluated for this on 8/9.  Patient had a minor crush injury causing a contusion.  X-ray was negative.  She followed up on 8/12 with some improvement, requesting note to return to work.  Patient represented to the emergency department on 8/16 with an open wound and some signs of cellulitis.  She was started on doxycycline  which she has taken.  Patient reports continued swelling in the left lower extremity and calf area.  The wound has closed.  She denies any active drainage or fevers.  No distal numbness or tingling.  The swelling is what is bothering her the most at the current time.  She has applied an Ace wrap without improvement.  No chest pain or shortness of breath.  No fevers.  Reports that the swelling is better in the morning, worse as the day progresses.       Prior to Admission medications   Medication Sig Start Date End Date Taking? Authorizing Provider  doxycycline  (VIBRAMYCIN ) 100 MG capsule Take 1 capsule (100 mg total) by mouth 2 (two) times daily. 08/21/23   Yolande Lamar BROCKS, MD  gabapentin  (NEURONTIN ) 100 MG capsule Take 1 capsule (100 mg total) by mouth at bedtime. 12/17/22   Shivaji, Lavonia HERO, MD  ibuprofen  (ADVIL ) 800 MG tablet Take 1 tablet (800 mg total) by mouth every 8 (eight) hours as needed. 12/17/22   Shivaji, Lavonia HERO, MD  NIFEdipine  (ADALAT  CC) 30 MG 24 hr tablet Take 1 tablet (30 mg total) by mouth at bedtime. 12/17/22   Shivaji, Lavonia HERO, MD  NIFEdipine  (ADALAT  CC) 60 MG 24 hr tablet Take 1 tablet (60 mg total) by mouth daily with breakfast. 12/17/22   Shivaji, Lavonia HERO, MD  oxyCODONE  (OXY IR/ROXICODONE ) 5 MG immediate release tablet Take 1 tablet  (5 mg total) by mouth every 6 (six) hours as needed for severe pain (pain score 7-10). 12/17/22   Shivaji, Lavonia HERO, MD  pantoprazole  (PROTONIX ) 20 MG tablet Take 1 tablet (20 mg total) by mouth daily. 12/22/22   Trudy Earnie CROME, CNM  Prenatal Vit-Fe Fumarate-FA (PRENATAL MULTIVITAMIN) TABS tablet Take 1 tablet by mouth daily at 12 noon.    [provider]  simethicone  (MYLICON) 80 MG chewable tablet Chew 1 tablet (80 mg total) by mouth as needed for flatulence. 12/17/22   Shivaji, Lavonia HERO, MD    Allergies: Patient has no known allergies.    Review of Systems  Updated Vital Signs BP (!) 169/100 (BP Location: Right Arm)   Pulse 74   Temp 97.9 F (36.6 C) (Oral)   Resp 16   Ht 5' 1 (1.549 m)   Wt 83.9 kg   SpO2 100%   BMI 34.95 kg/m   Physical Exam Vitals and nursing note reviewed.  Constitutional:      Appearance: She is well-developed.  HENT:     Head: Normocephalic and atraumatic.  Eyes:     Pupils: Pupils are equal, round, and reactive to light.  Cardiovascular:     Pulses: Normal pulses. No decreased pulses.  Musculoskeletal:        General: Tenderness present.     Cervical back: Normal range of  motion and neck supple.     Comments: Patient with some swelling and mild tenderness of the left posterior calf.  Laterally there is a healing wound without signs of cellulitis.  No drainage.  Distal circulation, motor, and sensation is intact.  Skin:    General: Skin is warm and dry.  Neurological:     Mental Status: She is alert.     Sensory: No sensory deficit.     Comments: Motor, sensation, and vascular distal to the injury is fully intact.   Psychiatric:        Mood and Affect: Mood normal.    Today (8/25)   Previous visit (8/16)  (all labs ordered are listed, but only abnormal results are displayed) Labs Reviewed - No data to display  EKG: None  Radiology: US  Venous Img Lower  Left (DVT Study) Result Date: 08/30/2023 CLINICAL DATA:  Swelling  and pain, rule out DVT EXAM: Left LOWER EXTREMITY VENOUS DOPPLER ULTRASOUND TECHNIQUE: Gray-scale sonography with compression, as well as color and duplex ultrasound, were performed to evaluate the deep venous system(s) from the level of the common femoral vein through the popliteal and proximal calf veins. COMPARISON:  None Available. FINDINGS: VENOUS Normal compressibility of the common femoral, superficial femoral, and popliteal veins, as well as the visualized calf veins. Visualized portions of profunda femoral vein and great saphenous vein unremarkable. No filling defects to suggest DVT on grayscale or color Doppler imaging. Doppler waveforms show normal direction of venous flow, normal respiratory plasticity and response to augmentation. Limited views of the contralateral common femoral vein are unremarkable. OTHER In the area of concern in outer aspect of the left calf area of pain there is a subcutaneous complex heterogeneous fluid containing well-defined lesion with internal septation measuring 4.4 x 1.2 x 3.3 cm without significant increased vascularity. Limitations: none IMPRESSION: No DVT. Subcutaneous hematoma versus a complex cyst in the area of interest in left calf. Correlate with clinical findings and follow-up with MRI/ultrasound to ensure resolution. Electronically Signed   By: Megan  Zare M.D.   On: 08/30/2023 12:09     Procedures   Medications Ordered in the ED - No data to display  ED Course  Patient seen and examined. History obtained directly from patient.   Labs/EKG: None ordered  Imaging: Given persistent pain and swelling of the calf, recent injury, will rule out DVT with lower extremity DVT study.  Medications/Fluids: None ordered.  Most recent vital signs reviewed and are as follows: BP (!) 169/100 (BP Location: Right Arm)   Pulse 74   Temp 97.9 F (36.6 C) (Oral)   Resp 16   Ht 5' 1 (1.549 m)   Wt 83.9 kg   SpO2 100%   BMI 34.95 kg/m   Initial impression:  Appears interval improvement of soft tissue infection/cellulitis.  I would expect some degree of swelling related to her injury given that it is well-healing.  Will rule out DVT.  Will recommend compression, continued rest and elevation as possible, while the area heals.  However I would expect this to gradually improve over the next several weeks.  12:20 PM Reassessment performed. Patient appears comfortable.    Imaging results reviewed including: DVT study, negative for DVT, 4 x 3 x 1 cm hematoma noted.  Reviewed pertinent lab work and imaging with patient at bedside. Questions answered.   Most current vital signs reviewed and are as follows: BP (!) 169/100 (BP Location: Right Arm)   Pulse 74   Temp 97.9  F (36.6 C) (Oral)   Resp 16   Ht 5' 1 (1.549 m)   Wt 83.9 kg   SpO2 100%   BMI 34.95 kg/m   Plan: Discharge to home.   Prescriptions written for: None  Other home care instructions discussed: Elevation, discussed benefits of compression, rest when able  ED return instructions discussed: No worsening pain, swelling, redness, drainage.  Follow-up instructions discussed: Patient encouraged to follow-up with their PCP as needed.  Encouraged follow-up for reimaging if swelling is not improving after 4 weeks.                                   Medical Decision Making  Patient with traumatic injury to the left lateral ankle and leg area.  Initially improved however she developed some cellulitis which was treated with doxycycline .  From an infectious standpoint, this appears to be improving.  Patient with continued swelling and tenderness.  Evaluated today for DVT.  Negative, but found to have a hematoma likely in the area of swelling and tenderness.  Patient will continue conservative measures, follow-up as needed.  No indication for further antibiotics at this time.     Final diagnoses:  Visit for wound check  Contusion of left lower extremity, subsequent encounter  Swelling  of left lower extremity    ED Discharge Orders     None          Desiderio Chew, PA-C 08/30/23 1222    Neysa Caron PARAS, DO 08/30/23 1458

## 2023-10-25 DIAGNOSIS — Z1330 Encounter for screening examination for mental health and behavioral disorders, unspecified: Secondary | ICD-10-CM | POA: Diagnosis not present

## 2023-10-25 DIAGNOSIS — M7989 Other specified soft tissue disorders: Secondary | ICD-10-CM | POA: Diagnosis not present

## 2023-11-02 DIAGNOSIS — Z1329 Encounter for screening for other suspected endocrine disorder: Secondary | ICD-10-CM | POA: Diagnosis not present

## 2023-11-02 DIAGNOSIS — Z8742 Personal history of other diseases of the female genital tract: Secondary | ICD-10-CM | POA: Diagnosis not present

## 2023-11-02 DIAGNOSIS — Z0000 Encounter for general adult medical examination without abnormal findings: Secondary | ICD-10-CM | POA: Diagnosis not present

## 2023-11-02 DIAGNOSIS — Z13220 Encounter for screening for lipoid disorders: Secondary | ICD-10-CM | POA: Diagnosis not present

## 2023-11-02 DIAGNOSIS — Z23 Encounter for immunization: Secondary | ICD-10-CM | POA: Diagnosis not present

## 2023-11-02 DIAGNOSIS — Z136 Encounter for screening for cardiovascular disorders: Secondary | ICD-10-CM | POA: Diagnosis not present

## 2023-11-02 DIAGNOSIS — R87610 Atypical squamous cells of undetermined significance on cytologic smear of cervix (ASC-US): Secondary | ICD-10-CM | POA: Diagnosis not present

## 2023-11-02 DIAGNOSIS — Z124 Encounter for screening for malignant neoplasm of cervix: Secondary | ICD-10-CM | POA: Diagnosis not present

## 2023-11-02 DIAGNOSIS — Z131 Encounter for screening for diabetes mellitus: Secondary | ICD-10-CM | POA: Diagnosis not present

## 2023-11-23 DIAGNOSIS — R600 Localized edema: Secondary | ICD-10-CM | POA: Diagnosis not present

## 2023-11-23 DIAGNOSIS — M545 Low back pain, unspecified: Secondary | ICD-10-CM | POA: Diagnosis not present

## 2023-11-23 DIAGNOSIS — G8929 Other chronic pain: Secondary | ICD-10-CM | POA: Diagnosis not present

## 2023-11-24 ENCOUNTER — Emergency Department (HOSPITAL_COMMUNITY)

## 2023-11-24 ENCOUNTER — Encounter (HOSPITAL_COMMUNITY): Payer: Self-pay | Admitting: Emergency Medicine

## 2023-11-24 ENCOUNTER — Emergency Department (HOSPITAL_COMMUNITY)
Admission: EM | Admit: 2023-11-24 | Discharge: 2023-11-24 | Discharge status: Against Medical Advice | Attending: Emergency Medicine | Admitting: Emergency Medicine

## 2023-11-24 DIAGNOSIS — R197 Diarrhea, unspecified: Secondary | ICD-10-CM | POA: Insufficient documentation

## 2023-11-24 DIAGNOSIS — Z5321 Procedure and treatment not carried out due to patient leaving prior to being seen by health care provider: Secondary | ICD-10-CM | POA: Insufficient documentation

## 2023-11-24 DIAGNOSIS — R0789 Other chest pain: Secondary | ICD-10-CM | POA: Diagnosis not present

## 2023-11-24 DIAGNOSIS — R079 Chest pain, unspecified: Secondary | ICD-10-CM | POA: Diagnosis not present

## 2023-11-24 DIAGNOSIS — R110 Nausea: Secondary | ICD-10-CM | POA: Diagnosis not present

## 2023-11-24 DIAGNOSIS — I10 Essential (primary) hypertension: Secondary | ICD-10-CM | POA: Insufficient documentation

## 2023-11-24 LAB — BASIC METABOLIC PANEL WITH GFR
Anion gap: 15 (ref 5–15)
BUN: 12 mg/dL (ref 6–20)
CO2: 22 mmol/L (ref 22–32)
Calcium: 9.1 mg/dL (ref 8.9–10.3)
Chloride: 102 mmol/L (ref 98–111)
Creatinine, Ser: 0.74 mg/dL (ref 0.44–1.00)
GFR, Estimated: >60 mL/min (ref >60)
Glucose, Bld: 105 mg/dL — ABNORMAL HIGH (ref 70–99)
Potassium: 3.4 mmol/L — ABNORMAL LOW (ref 3.5–5.1)
Sodium: 139 mmol/L (ref 135–145)

## 2023-11-24 LAB — CBC
HCT: 38.6 % (ref 36.0–46.0)
Hemoglobin: 12.7 g/dL (ref 12.0–15.0)
MCH: 25.9 pg — ABNORMAL LOW (ref 26.0–34.0)
MCHC: 32.9 g/dL (ref 30.0–36.0)
MCV: 78.6 fL — ABNORMAL LOW (ref 80.0–100.0)
Platelets: 352 K/uL (ref 150–400)
RBC: 4.91 MIL/uL (ref 3.87–5.11)
RDW: 13.2 % (ref 11.5–15.5)
WBC: 7.2 K/uL (ref 4.0–10.5)
nRBC: 0 % (ref 0.0–0.2)

## 2023-11-24 LAB — TROPONIN I (HIGH SENSITIVITY)
Troponin I (High Sensitivity): <2 ng/L (ref <18)
Troponin I (High Sensitivity): 2 ng/L (ref <18)

## 2023-11-24 LAB — HCG, SERUM, QUALITATIVE: Preg, Serum: NEGATIVE

## 2023-11-24 NOTE — ED Triage Notes (Signed)
 Pt here from home with c/o cheat pain , 324mg  asa and 1 nitroglycerin by ems , some slight nausea no sob , does have hx of htn

## 2023-11-24 NOTE — ED Notes (Signed)
 Called pt to update vitals, pt said she is going to leave and will come back if her condition gets worse. This NT saw pt leaving

## 2023-11-24 NOTE — ED Provider Triage Note (Signed)
 Emergency Medicine Provider Triage Evaluation Note  TARIANA MOLDOVAN , a 38 y.o. female  was evaluated in triage.  Pt complains of cp. Pt developed pain to L side of chest radiates to neck and L arm after eating a bowl of cereal at 2am this morning.  No dizzy, lighthead, sob.  Does endorse some nausea and diarrhea.  No cardiac hx.  Took 3 baby ASA  Review of Systems  Positive: As above Negative: As above  Physical Exam  BP (!) 151/100 (BP Location: Right Arm)   Pulse 65   Temp 98.1 F (36.7 C) (Oral)   Resp 19   SpO2 100%  Gen:   Awake, no distress   Resp:  Normal effort  MSK:   Moves extremities without difficulty  Other:    Medical Decision Making  Medically screening exam initiated at 7:53 AM.  Appropriate orders placed.  Lesette Frary Winstead was informed that the remainder of the evaluation will be completed by another provider, this initial triage assessment does not replace that evaluation, and the importance of remaining in the ED until their evaluation is complete.     Nivia Colon, PA-C 11/24/23 [phone]

## 2023-11-25 DIAGNOSIS — R079 Chest pain, unspecified: Secondary | ICD-10-CM | POA: Diagnosis not present

## 2023-11-25 DIAGNOSIS — R0683 Snoring: Secondary | ICD-10-CM | POA: Diagnosis not present

## 2023-11-25 DIAGNOSIS — K219 Gastro-esophageal reflux disease without esophagitis: Secondary | ICD-10-CM | POA: Diagnosis not present

## 2023-12-07 DIAGNOSIS — M940 Chondrocostal junction syndrome [Tietze]: Secondary | ICD-10-CM | POA: Diagnosis not present

## 2023-12-07 DIAGNOSIS — E669 Obesity, unspecified: Secondary | ICD-10-CM | POA: Diagnosis not present

## 2023-12-07 DIAGNOSIS — I10 Essential (primary) hypertension: Secondary | ICD-10-CM | POA: Diagnosis not present

## 2023-12-07 DIAGNOSIS — Z1330 Encounter for screening examination for mental health and behavioral disorders, unspecified: Secondary | ICD-10-CM | POA: Diagnosis not present

## 2023-12-07 DIAGNOSIS — F331 Major depressive disorder, recurrent, moderate: Secondary | ICD-10-CM | POA: Diagnosis not present

## 2023-12-28 DIAGNOSIS — I10 Essential (primary) hypertension: Secondary | ICD-10-CM | POA: Diagnosis not present

## 2023-12-28 DIAGNOSIS — E669 Obesity, unspecified: Secondary | ICD-10-CM | POA: Diagnosis not present

## 2023-12-28 DIAGNOSIS — J0100 Acute maxillary sinusitis, unspecified: Secondary | ICD-10-CM | POA: Diagnosis not present

## 2023-12-28 DIAGNOSIS — R0981 Nasal congestion: Secondary | ICD-10-CM | POA: Diagnosis not present
